# Patient Record
Sex: Male | Born: 1965
Health system: Southern US, Community
[De-identification: ages and names within clinical notes are randomized; demographics above are authoritative.]

## PROBLEM LIST (undated history)

## (undated) DIAGNOSIS — E785 Hyperlipidemia, unspecified: Secondary | ICD-10-CM

## (undated) DIAGNOSIS — E669 Obesity, unspecified: Secondary | ICD-10-CM

## (undated) DIAGNOSIS — E119 Type 2 diabetes mellitus without complications: Secondary | ICD-10-CM

## (undated) DIAGNOSIS — G4730 Sleep apnea, unspecified: Secondary | ICD-10-CM

## (undated) DIAGNOSIS — H409 Unspecified glaucoma: Secondary | ICD-10-CM

## (undated) DIAGNOSIS — I219 Acute myocardial infarction, unspecified: Secondary | ICD-10-CM

## (undated) DIAGNOSIS — I2510 Atherosclerotic heart disease of native coronary artery without angina pectoris: Secondary | ICD-10-CM

## (undated) DIAGNOSIS — S83249A Other tear of medial meniscus, current injury, unspecified knee, initial encounter: Secondary | ICD-10-CM

## (undated) DIAGNOSIS — I10 Essential (primary) hypertension: Secondary | ICD-10-CM

## (undated) DIAGNOSIS — F419 Anxiety disorder, unspecified: Secondary | ICD-10-CM

## (undated) HISTORY — DX: Essential (primary) hypertension: I10

## (undated) HISTORY — DX: Atherosclerotic heart disease of native coronary artery without angina pectoris: I25.10

## (undated) HISTORY — DX: Sleep apnea, unspecified: G47.30

## (undated) HISTORY — DX: Other tear of medial meniscus, current injury, unspecified knee, initial encounter: S83.249A

## (undated) HISTORY — DX: Type 2 diabetes mellitus without complications: E11.9

## (undated) HISTORY — PX: CORONARY ANGIOPLASTY: SHX604

## (undated) HISTORY — DX: Obesity, unspecified: E66.9

## (undated) HISTORY — PX: CARDIAC CATHETERIZATION: SHX172

## (undated) HISTORY — DX: Hyperlipidemia, unspecified: E78.5

## (undated) HISTORY — DX: Anxiety disorder, unspecified: F41.9

## (undated) HISTORY — DX: Unspecified glaucoma: H40.9

---

## 2003-06-26 ENCOUNTER — Ambulatory Visit (HOSPITAL_COMMUNITY): Admission: RE | Admit: 2003-06-26 | Discharge: 2003-06-26 | Payer: Self-pay | Admitting: *Deleted

## 2006-08-01 ENCOUNTER — Ambulatory Visit: Payer: Self-pay | Admitting: Family Medicine

## 2006-08-30 ENCOUNTER — Ambulatory Visit: Payer: Self-pay | Admitting: Family Medicine

## 2006-09-30 ENCOUNTER — Ambulatory Visit: Payer: Self-pay | Admitting: Family Medicine

## 2006-11-02 ENCOUNTER — Ambulatory Visit: Payer: Self-pay | Admitting: Family Medicine

## 2006-11-16 DIAGNOSIS — G4730 Sleep apnea, unspecified: Secondary | ICD-10-CM | POA: Insufficient documentation

## 2006-12-02 ENCOUNTER — Ambulatory Visit: Payer: Self-pay | Admitting: Family Medicine

## 2007-01-03 ENCOUNTER — Ambulatory Visit: Payer: Self-pay | Admitting: Family Medicine

## 2007-04-06 ENCOUNTER — Ambulatory Visit: Payer: Self-pay | Admitting: Family Medicine

## 2007-04-22 ENCOUNTER — Encounter (INDEPENDENT_AMBULATORY_CARE_PROVIDER_SITE_OTHER): Payer: Self-pay | Admitting: Family Medicine

## 2007-06-09 ENCOUNTER — Ambulatory Visit: Payer: Self-pay | Admitting: Family Medicine

## 2007-06-09 DIAGNOSIS — F17200 Nicotine dependence, unspecified, uncomplicated: Secondary | ICD-10-CM | POA: Insufficient documentation

## 2007-06-19 ENCOUNTER — Telehealth (INDEPENDENT_AMBULATORY_CARE_PROVIDER_SITE_OTHER): Payer: Self-pay | Admitting: *Deleted

## 2007-07-07 ENCOUNTER — Ambulatory Visit: Payer: Self-pay | Admitting: Family Medicine

## 2007-07-07 DIAGNOSIS — M25562 Pain in left knee: Secondary | ICD-10-CM | POA: Insufficient documentation

## 2007-07-10 ENCOUNTER — Telehealth (INDEPENDENT_AMBULATORY_CARE_PROVIDER_SITE_OTHER): Payer: Self-pay | Admitting: *Deleted

## 2007-07-12 ENCOUNTER — Encounter (INDEPENDENT_AMBULATORY_CARE_PROVIDER_SITE_OTHER): Payer: Self-pay | Admitting: Family Medicine

## 2007-07-17 ENCOUNTER — Telehealth (INDEPENDENT_AMBULATORY_CARE_PROVIDER_SITE_OTHER): Payer: Self-pay | Admitting: Family Medicine

## 2007-08-04 ENCOUNTER — Telehealth (INDEPENDENT_AMBULATORY_CARE_PROVIDER_SITE_OTHER): Payer: Self-pay | Admitting: *Deleted

## 2007-09-09 ENCOUNTER — Ambulatory Visit: Payer: Self-pay | Admitting: Family Medicine

## 2007-09-10 ENCOUNTER — Emergency Department (HOSPITAL_COMMUNITY): Admission: EM | Admit: 2007-09-10 | Discharge: 2007-09-10 | Payer: Self-pay | Admitting: Emergency Medicine

## 2007-10-16 ENCOUNTER — Ambulatory Visit: Payer: Self-pay | Admitting: Family Medicine

## 2007-11-20 ENCOUNTER — Ambulatory Visit: Payer: Self-pay | Admitting: Family Medicine

## 2007-11-21 ENCOUNTER — Ambulatory Visit: Payer: Self-pay | Admitting: Family Medicine

## 2007-11-23 LAB — CONVERTED CEMR LAB
Cholesterol: 236 mg/dL (ref 0–200)
Glucose, Bld: 100 mg/dL — ABNORMAL HIGH (ref 70–99)
HDL: 31.5 mg/dL — ABNORMAL LOW (ref >39.0)
Total CHOL/HDL Ratio: 7.5
Triglycerides: 185 mg/dL — ABNORMAL HIGH (ref 0–149)
VLDL: 37 mg/dL (ref 0–40)

## 2007-11-24 ENCOUNTER — Encounter (INDEPENDENT_AMBULATORY_CARE_PROVIDER_SITE_OTHER): Payer: Self-pay | Admitting: *Deleted

## 2008-04-03 ENCOUNTER — Ambulatory Visit: Payer: Self-pay | Admitting: Internal Medicine

## 2009-02-25 ENCOUNTER — Ambulatory Visit: Payer: Self-pay | Admitting: Cardiology

## 2009-02-25 ENCOUNTER — Telehealth: Payer: Self-pay | Admitting: Internal Medicine

## 2009-02-25 ENCOUNTER — Encounter: Payer: Self-pay | Admitting: Cardiology

## 2009-02-25 ENCOUNTER — Observation Stay (HOSPITAL_COMMUNITY): Admission: EM | Admit: 2009-02-25 | Discharge: 2009-02-27 | Payer: Self-pay | Admitting: Emergency Medicine

## 2009-03-03 ENCOUNTER — Telehealth: Payer: Self-pay | Admitting: Cardiology

## 2009-03-06 ENCOUNTER — Encounter: Payer: Self-pay | Admitting: Cardiology

## 2009-03-06 ENCOUNTER — Telehealth: Payer: Self-pay | Admitting: Cardiology

## 2009-03-11 ENCOUNTER — Telehealth (INDEPENDENT_AMBULATORY_CARE_PROVIDER_SITE_OTHER): Payer: Self-pay | Admitting: *Deleted

## 2009-03-14 ENCOUNTER — Telehealth: Payer: Self-pay | Admitting: Cardiology

## 2009-03-18 ENCOUNTER — Encounter: Payer: Self-pay | Admitting: Cardiology

## 2009-03-18 DIAGNOSIS — E785 Hyperlipidemia, unspecified: Secondary | ICD-10-CM | POA: Insufficient documentation

## 2009-03-18 DIAGNOSIS — Z9861 Coronary angioplasty status: Secondary | ICD-10-CM

## 2009-03-18 DIAGNOSIS — I2510 Atherosclerotic heart disease of native coronary artery without angina pectoris: Secondary | ICD-10-CM | POA: Insufficient documentation

## 2009-03-19 ENCOUNTER — Ambulatory Visit: Payer: Self-pay | Admitting: Cardiology

## 2009-03-20 ENCOUNTER — Encounter (HOSPITAL_COMMUNITY): Admission: RE | Admit: 2009-03-20 | Discharge: 2009-06-13 | Payer: Self-pay | Admitting: Cardiology

## 2009-04-01 ENCOUNTER — Encounter: Payer: Self-pay | Admitting: Cardiology

## 2009-05-30 ENCOUNTER — Ambulatory Visit: Payer: Self-pay | Admitting: Cardiology

## 2009-06-17 ENCOUNTER — Ambulatory Visit: Payer: Self-pay | Admitting: Cardiology

## 2009-06-23 LAB — CONVERTED CEMR LAB
AST: 22 units/L (ref 0–37)
Albumin: 4.2 g/dL (ref 3.5–5.2)
HDL: 40.2 mg/dL (ref >39.00)
Total Bilirubin: 0.8 mg/dL (ref 0.3–1.2)
Total CHOL/HDL Ratio: 3
Triglycerides: 80 mg/dL (ref 0.0–149.0)

## 2009-08-28 ENCOUNTER — Ambulatory Visit: Payer: Self-pay | Admitting: Cardiology

## 2009-10-22 ENCOUNTER — Ambulatory Visit: Payer: Self-pay

## 2009-10-22 ENCOUNTER — Ambulatory Visit: Payer: Self-pay | Admitting: Cardiology

## 2010-04-06 ENCOUNTER — Ambulatory Visit: Payer: Self-pay | Admitting: Cardiology

## 2010-04-20 ENCOUNTER — Ambulatory Visit: Payer: Self-pay | Admitting: Cardiology

## 2010-04-23 LAB — CONVERTED CEMR LAB
ALT: 27 units/L (ref 0–53)
Albumin: 4.3 g/dL (ref 3.5–5.2)
BUN: 16 mg/dL (ref 6–23)
Basophils Relative: 0.4 % (ref 0.0–3.0)
Bilirubin, Direct: 0.1 mg/dL (ref 0.0–0.3)
Chloride: 104 meq/L (ref 96–112)
Cholesterol: 118 mg/dL (ref 0–200)
Eosinophils Relative: 1.6 % (ref 0.0–5.0)
GFR calc non Af Amer: 71.91 mL/min (ref >60)
HCT: 42.8 % (ref 39.0–52.0)
HDL: 39.2 mg/dL (ref >39.00)
Hemoglobin: 14.8 g/dL (ref 13.0–17.0)
LDL Cholesterol: 63 mg/dL (ref 0–99)
Lymphs Abs: 2.7 10*3/uL (ref 0.7–4.0)
MCV: 93.5 fL (ref 78.0–100.0)
Monocytes Absolute: 0.6 10*3/uL (ref 0.1–1.0)
Monocytes Relative: 8.2 % (ref 3.0–12.0)
Neutro Abs: 3.6 10*3/uL (ref 1.4–7.7)
Platelets: 224 10*3/uL (ref 150.0–400.0)
Potassium: 4.9 meq/L (ref 3.5–5.1)
RBC: 4.57 M/uL (ref 4.22–5.81)
Sodium: 137 meq/L (ref 135–145)
Total Protein: 6.6 g/dL (ref 6.0–8.3)
VLDL: 16.2 mg/dL (ref 0.0–40.0)
WBC: 7 10*3/uL (ref 4.5–10.5)

## 2010-05-29 ENCOUNTER — Telehealth: Payer: Self-pay | Admitting: Cardiology

## 2010-06-03 ENCOUNTER — Telehealth: Payer: Self-pay | Admitting: Cardiology

## 2010-06-04 ENCOUNTER — Telehealth: Payer: Self-pay | Admitting: Cardiology

## 2010-06-24 ENCOUNTER — Encounter: Payer: Self-pay | Admitting: Cardiology

## 2010-07-02 ENCOUNTER — Telehealth: Payer: Self-pay | Admitting: Cardiology

## 2010-07-06 ENCOUNTER — Ambulatory Visit: Payer: Self-pay | Admitting: Family Medicine

## 2010-07-14 ENCOUNTER — Encounter: Payer: Self-pay | Admitting: Family Medicine

## 2010-09-18 ENCOUNTER — Ambulatory Visit: Payer: Self-pay | Admitting: Internal Medicine

## 2010-09-29 ENCOUNTER — Ambulatory Visit
Admission: RE | Admit: 2010-09-29 | Discharge: 2010-09-29 | Payer: Self-pay | Source: Home / Self Care | Attending: Internal Medicine | Admitting: Internal Medicine

## 2010-10-27 NOTE — Consult Note (Signed)
Summary: [facility]   Imported By: Lanelle Bal 07/22/2010 10:04:03  _____________________________________________________________________  External Attachment:    Type:   Image     Comment:   External Document

## 2010-10-27 NOTE — Progress Notes (Signed)
Summary: refill request [facility] and local pharmacy  Phone Note Refill Request Message from:  Patient on May 29, 2010 10:28 AM  Refills Requested: Medication #1:  METOPROLOL SUCCINATE 25 MG XR24H-TAB Take one tablet by mouth daily  Medication #2:  CRESTOR 20 MG TABS Take one tablet by mouth daily.  Medication #3:  WELLBUTRIN SR 150 MG XR12H-TAB 1 tab two times a day. [facility] refill- pt out however and would like a 3 week supply of the crestor and metoprolol called tot [facility] [address]   Method Requested: Fax to Local Pharmacy Initial call taken by: Glynda Jaeger,  May 29, 2010 10:28 AM    Prescriptions: WELLBUTRIN SR 150 MG XR12H-TAB (BUPROPION HCL) 1 tab two times a day  #60 x 0   Entered by:   Burnett Kanaris, CNA   Authorized by:   Lenoria Farrier, MD, [facility]   Signed by:   Burnett Kanaris, CNA on 06/02/2010   Method used:   Electronically to        [facility] [address] [id number]* (retail)       [address]       [place], [place]  [address]       Ph: [phone]       Fax: [phone]   RxID:   [id number] CRESTOR 20 MG TABS (ROSUVASTATIN CALCIUM) Take one tablet by mouth daily.  #30 x 0   Entered by:   Burnett Kanaris, CNA   Authorized by:   Lenoria Farrier, MD, [facility]   Signed by:   Burnett Kanaris, CNA on 06/02/2010   Method used:   Electronically to        [id number]* (retail)       [address]       [place], [place]  [address]       Ph: [phone]       Fax: [phone]   RxID:   [id number] METOPROLOL SUCCINATE 25 MG XR24H-TAB (METOPROLOL SUCCINATE) Take one tablet by mouth daily  #30 x 0   Entered by:   Burnett Kanaris, CNA   Authorized by:   Lenoria Farrier, MD, [facility]   Signed by:   Burnett Kanaris, CNA on 06/02/2010   Method used:   Electronically to        [facility] [id number]* (retail)       [address]       [place], [place]  [address]       Ph: [phone]       Fax: [phone]   RxID:   [id number] [facility] SR 150 MG XR12H-TAB (BUPROPION HCL) 1 tab two times a day  #180 x 3   Entered by:   Burnett Kanaris, CNA   Authorized by:   Lenoria Farrier, MD, [facility]   Signed by:   Burnett Kanaris, CNA on 06/02/2010   Method used:   Faxed to ...       [facility] (mail-order)             , [place]         Ph: [phone]       Fax: [phone]   RxID:   [id number] CRESTOR 20 MG TABS (ROSUVASTATIN CALCIUM) Take one tablet by mouth daily.  #90 x 3   Entered by:  Burnett Kanaris, CNA   Authorized by:   Lenoria Farrier, MD, [facility]   Signed by:   Burnett Kanaris, CNA on 06/02/2010   Method used:   Faxed to ...       [facility] (mail-order)             , [place]         Ph: [phone]       Fax: [phone]   RxID:   [id number] METOPROLOL SUCCINATE 25 MG XR24H-TAB (METOPROLOL SUCCINATE) Take one tablet by mouth daily  #90 x 3   Entered by:   Burnett Kanaris, CNA   Authorized by:   Lenoria Farrier, MD, [facility]   Signed by:   Burnett Kanaris, CNA on 06/02/2010   Method used:   Faxed to ...       [facility] (mail-order)             , [place]         Ph: [phone]       Fax: [phone]   RxID:   [id number]

## 2010-10-27 NOTE — Progress Notes (Signed)
Summary: Wellbutrin- DO NOT REFILL  Phone Note Outgoing Call   Summary of Call: Fax received from [facility] questioning duration the pt has been on bupropion. In reviewing the pt's chart, he was started on this in 6/10 at hospital d/c for smokeless tobacco usage. I have reviewed with Dr. [name], since I see no other indication for this. He states we are not to refill this anymore, unless the pt is taking it for something other than smoking cessation. If he is, he needs to have this refilled through his PCP. I will send a flag to Burnett Kanaris- CMA for next time the pt's refill is due. Initial call taken by: Sherri Rad, RN, BSN,  July 02, 2010 2:11 PM

## 2010-10-27 NOTE — Assessment & Plan Note (Signed)
Summary: CUT ON LEFT BIG TOE//PH   Vital Signs:  Patient profile:   45 year old male Height:      72 inches Weight:      252 pounds BMI:     34.30 Temp:     98.4 degrees F oral BP sitting:   124 / 80  (left arm)  Vitals Entered By: Doristine Devoid CMA (July 06, 2010 9:16 AM) CC: cut on L toe xtues. painful    History of Present Illness: 45 yo man here today for a wound on his L toe.  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  area is now painful and swollen.  pain to the point of nausea.  no pus.  reports boards were plastic and not wooden so no possibility of foreign body.  hx of ingrown nail.  Current Medications (verified): 1)  Aspirin Ec 325 Mg Tbec (Aspirin) .... Take One Tablet By Mouth Daily 2)  Metoprolol Succinate 25 Mg Xr24h-Tab (Metoprolol Succinate) .... Take One Tablet By Mouth Daily 3)  Crestor 20 Mg Tabs (Rosuvastatin Calcium) .... Take One Tablet By Mouth Daily. 4)  Nitrostat 0.4 Mg Subl (Nitroglycerin) .Marland Kitchen.. 1 By Mouth As Needed For Chest Pain As Directed 5)  Wellbutrin Sr 150 Mg Xr12h-Tab (Bupropion Hcl) .Marland Kitchen.. 1 Tab Two Times A Day  Allergies (verified): No Known Drug Allergies  Review of Systems      See HPI  Physical Exam  General:  alert and well-developed.   Neurologic:  left great toe w/ small laceration at lateral nail edge.  nail is short and appears to be ingrown.  due to pt's level of pain will refer to podiatry for full evaluation and tx.  no obvious infxn- no pus or drainage from the area.   Impression & Recommendations:  Problem # 1:  INGROWN NAIL (ICD-703.0) Assessment Unchanged given laceration superimposed on ingrown nail will refer to podiatry for evaluation and tx.  pt unable to tolerate exam of toe and nail in office due to pain.  no obvious infxn- will hold on abx. Orders: Podiatry Referral (Podiatry)  Complete Medication List: 1)  Aspirin Ec 325 Mg Tbec (Aspirin) .... Take one tablet by mouth daily 2)   Metoprolol Succinate 25 Mg Xr24h-tab (Metoprolol succinate) .... Take one tablet by mouth daily 3)  Crestor 20 Mg Tabs (Rosuvastatin calcium) .... Take one tablet by mouth daily. 4)  Nitrostat 0.4 Mg Subl (Nitroglycerin) .Marland Kitchen.. 1 by mouth as needed for chest pain as directed 5)  Wellbutrin Sr 150 Mg Xr12h-tab (Bupropion hcl) .Marland Kitchen.. 1 tab two times a day  Patient Instructions: 1)  Our referral coordinator will get you set up w/ your podiatry appt 2)  At this point the cut itself doesn't appear to be infected but once podiatry evaluates you they will better be able to determine if you need antibiotics 3)  No more breaking boards for awhile!

## 2010-10-27 NOTE — Progress Notes (Signed)
Summary: rtn call from debra jones  Phone Note Refill Request Call back at [phone] Message from:  Pharmacy on [place] from [facility]  rtn your call Debra  Initial call taken by: Omer Jack,  June 04, 2010 10:24 AM  Follow-up for Phone Call        pt  plavix was d/c in July . I explained to pt that plavix  was discontiued  No refills Follow-up by: Burnett Kanaris, CNA,  June 04, 2010 2:55 PM

## 2010-10-27 NOTE — Miscellaneous (Signed)
Summary: Pt Instructions- Post GXT  Clinical Lists Changes  Observations: Added new observation of PI CARDIO: Your physician wants you to follow-up in: 6 months.  You will receive a reminder letter in the mail two months in advance. If you don't receive a letter, please call our office to schedule the follow-up appointment. (10/22/2009 17:34)      Patient Instructions: 1)  Your physician wants you to follow-up in: 6 months.  You will receive a reminder letter in the mail two months in advance. If you don't receive a letter, please call our office to schedule the follow-up appointment.

## 2010-10-27 NOTE — Assessment & Plan Note (Signed)
Summary: f5m   Primary Provider:  Nolon Rod. Paz MD   History of Present Illness: Patient is 45 years old and return for followup management of CAD. In June of 2010 he had an inferior MI treated with a drug-eluting stent to the posterior descending branch of the right coronary. His ejection fraction is 55%. Following his MI he lost 40 pounds. There he has done quite well since that time has had no chest pain shortness of breath or palpitations. He was using smokeless tobacco but he stopped this. His other problem includes hyperlipidemia.  He works with [employer]. He also does a form of judo.  Current Medications (verified): 1)  Aspirin Ec 325 Mg Tbec (Aspirin) .... Take One Tablet By Mouth Daily 2)  Plavix 75 Mg Tabs (Clopidogrel Bisulfate) .... Take One Tablet By Mouth Daily 3)  Metoprolol Succinate 25 Mg Xr24h-Tab (Metoprolol Succinate) .... Take One Tablet By Mouth Daily 4)  Crestor 20 Mg Tabs (Rosuvastatin Calcium) .... Take One Tablet By Mouth Daily. 5)  Nitrostat 0.4 Mg Subl (Nitroglycerin) .Marland Kitchen.. 1 By Mouth As Needed For Chest Pain As Directed 6)  Wellbutrin Sr 150 Mg Xr12h-Tab (Bupropion Hcl) .Marland Kitchen.. 1 Tab Two Times A Day  Allergies (verified): No Known Drug Allergies  Past History:  Past Medical History: Reviewed history from 03/18/2009 and no changes required.    SECONDARY DIAGNOSES: 1. Acute DMI Rx  successful percutaneous     coronary intervention and stenting of the posterior descending     artery with 2.5 x 15 mm XIENCE V drug-eluting stent this admission. 2. Hyperlipidemia. 3. Smokeless tobacco usage. 4. History of "white coat" hypertension. 5. Anxiety.  Review of Systems       ROS is negative except as outlined in HPI.   Vital Signs:  Patient profile:   45 year old male Height:      72 inches Weight:      243 pounds BMI:     33.08 Pulse rate:   64 / minute Resp:     16 per minute BP sitting:   126 / 86  (left arm)  Vitals Entered By: Marrion Coy, CNA (April 06, 2010 3:41 PM)  Physical Exam  Additional Exam:  Gen. Well-nourished, in no distress   Neck: No JVD, thyroid not enlarged, no carotid bruits Lungs: No tachypnea, clear without rales, rhonchi or wheezes Cardiovascular: Rhythm regular, PMI not displaced,  heart sounds  normal, no murmurs or gallops, no peripheral edema, pulses normal in all 4 extremities. Abdomen: BS normal, abdomen soft and non-tender without masses or organomegaly, no hepatosplenomegaly. MS: No deformities, no cyanosis or clubbing   Neuro:  No focal sns   Skin:  no lesions    Impression & Recommendations:  Problem # 1:  CAD, NATIVE VESSEL (ICD-414.01) Hhad an inferior MI treated in June of 2010 with a drug-eluting stent to the posterior descending branch the right coronary. Had no chest pain his palm is stable. Since this is a small branch vessel I think we can stop the Plavix and continue the aspirin.  I'll arrange for him to see Dr. Excell Seltzer back in followup in one year.   The following medications were removed from the medication list:    Plavix 75 Mg Tabs (Clopidogrel bisulfate) .Marland Kitchen... Take one tablet by mouth daily His updated medication list for this problem includes:    Aspirin Ec 325 Mg Tbec (Aspirin) .Marland Kitchen... Take one tablet by mouth daily    Metoprolol Succinate 25 Mg Xr24h-tab (  Metoprolol succinate) .Marland Kitchen... Take one tablet by mouth daily    Nitrostat 0.4 Mg Subl (Nitroglycerin) .Marland Kitchen... 1 by mouth as needed for chest pain as directed  Problem # 2:  HYPERLIPIDEMIA-MIXED (ICD-272.4) Tis managed with Crestor. His last lipid profile is good. We will plan to get a repeat fasting lipid profile. His updated medication list for this problem includes:    Crestor 20 Mg Tabs (Rosuvastatin calcium) .Marland Kitchen... Take one tablet by mouth daily.  His updated medication list for this problem includes:    Crestor 20 Mg Tabs (Rosuvastatin calcium) .Marland Kitchen... Take one tablet by mouth daily.  Patient Instructions: 1)  Your physician recommends  that you schedule a follow-up appointment in: 12 months with Dr. Excell Seltzer 2)  Your physician has recommended you make the following change in your medication:  STOP PLAVIX 3)  Your physician recommends that you return for a FASTING lipid profile, liver, cbc, and bmp Prescriptions: NITROSTAT 0.4 MG SUBL (NITROGLYCERIN) 1 by mouth as needed for chest pain as directed  #25 x 6   Entered by:   Marrion Coy, CNA   Authorized by:   Lenoria Farrier, MD, [facility]   Signed by:   Marrion Coy, CNA on 04/06/2010   Method used:   Electronically to        [facility] [address] [id number]* (retail)       [address]       [place], [place]  [address]       Ph: [phone]       Fax: [phone]   RxID:   [id number]

## 2010-10-27 NOTE — Progress Notes (Signed)
Summary: REFILL  Phone Note Refill Request Message from:  Patient on June 03, 2010 1:59 PM  Refills Requested: Medication #1:  METOPROLOL SUCCINATE 25 MG XR24H-TAB Take one tablet by mouth daily  Medication #2:  CRESTOR 20 MG TABS Take one tablet by mouth daily.  Medication #3:  WELLBUTRIN SR 150 MG XR12H-TAB 1 tab two times a day. SEND TO [facility] [address] PT IS OUT OF MEDICATION   Initial call taken by: Judie Grieve,  June 03, 2010 2:00 PM  Follow-up for Phone Call        I sebnt script in and faxed it too Follow-up by: Burnett Kanaris, CNA,  June 03, 2010 4:27 PM

## 2010-10-27 NOTE — Letter (Signed)
Summary: [facility]  [facility]   Imported By: Roderic Ovens 08/04/2010 16:19:23  _____________________________________________________________________  External Attachment:    Type:   Image     Comment:   External Document

## 2010-10-29 NOTE — Assessment & Plan Note (Signed)
Summary: mole removal//lch   Vital Signs:  Patient profile:   45 year old male Weight:      254.25 pounds Pulse rate:   71 / minute Pulse rhythm:   regular BP sitting:   136 / 80  (left arm) Cuff size:   large  Vitals Entered By: Army Fossa CMA (September 22, 2010 10:44 AM) CC: mole removal- upper thigh    History of Present Illness: last year he become aware of a mole at the right groin. Since then , it  has not change in size but cause some discomfort d/t location  Current Medications (verified): 1)  Aspirin Ec 325 Mg Tbec (Aspirin) .... Take One Tablet By Mouth Daily 2)  Crestor 20 Mg Tabs (Rosuvastatin Calcium) .... Take One Tablet By Mouth Daily. 3)  Nitrostat 0.4 Mg Subl (Nitroglycerin) .Marland Kitchen.. 1 By Mouth As Needed For Chest Pain As Directed 4)  Wellbutrin Sr 150 Mg Xr12h-Tab (Bupropion Hcl) .Marland Kitchen.. 1 Tab Two Times A Day  Allergies (verified): No Known Drug Allergies  Past History:  Past Medical History: 1. Acute DMI Rx  successful percutaneous     coronary intervention and stenting of the posterior descending     artery with 2.5 x 15 mm XIENCE V drug-eluting stent this admission. 2. Hyperlipidemia. 3. Smokeless tobacco usage. 4. History of "white coat" hypertension. 5. Anxiety.  Past Surgical History: Reviewed history from 11/21/2007 and no changes required. 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   Skin:  has a 3  mm , round, hyperchromic lesion at the right groin   Impression & Recommendations:  Problem # 1:  procedure note indication for the procedure:----right groin lesion causing discomfort in a sterile fashion and under local anesthesia with lidocaine 2% without, with did a superficial incision and removed  the lesion. He bled more than what I was expecting (on aspirin) I put 2 stitches He is to apply pressure if  bleeding Physical if signs or symptoms of infection Pathology sent------------------------------------------------------SEBORRHEIC KERATOSIS Come back in one week for stitches removal  Complete Medication List: 1)  Aspirin Ec 325 Mg Tbec (Aspirin) .... Take one tablet by mouth daily 2)  Crestor 20 Mg Tabs (Rosuvastatin calcium) .... Take one tablet by mouth daily. 3)  Nitrostat 0.4 Mg Subl (Nitroglycerin) .Marland Kitchen.. 1 by mouth as needed for chest pain as directed 4)  Wellbutrin Sr 150 Mg Xr12h-tab (Bupropion hcl) .Marland Kitchen.. 1 tab two times a day  Other Orders: Excise lesion (TAL) 0 - 0.5 cm (11400)   Orders Added: 1)  Excise lesion (TAL) 0 - 0.5 cm [11400]

## 2010-10-29 NOTE — Assessment & Plan Note (Signed)
Summary: remove stitches///sph   Vital Signs:  Patient profile:   45 year old male Weight:      257.25 pounds Pulse rate:   78 / minute Pulse rhythm:   regular BP sitting:   126 / 84  (left arm) Cuff size:   large  Vitals Entered By: Army Fossa CMA (September 29, 2010 9:47 AM) CC: Remove stitches    Current Medications (verified): 1)  Aspirin Ec 325 Mg Tbec (Aspirin) .... Take One Tablet By Mouth Daily 2)  Crestor 20 Mg Tabs (Rosuvastatin Calcium) .... Take One Tablet By Mouth Daily. 3)  Nitrostat 0.4 Mg Subl (Nitroglycerin) .Marland Kitchen.. 1 By Mouth As Needed For Chest Pain As Directed 4)  Wellbutrin Sr 150 Mg Xr12h-Tab (Bupropion Hcl) .Marland Kitchen.. 1 Tab Two Times A Day  Allergies (verified): No Known Drug Allergies   Impression & Recommendations:  Problem # 1:  SEBORRHEIC KERATOSIS (ICD-702.19)  sutures removed wound healing well no evidence of infex path discussed   Orders: No Charge Patient Arrived (NCPA0) (NCPA0)  Complete Medication List: 1)  Aspirin Ec 325 Mg Tbec (Aspirin) .... Take one tablet by mouth daily 2)  Crestor 20 Mg Tabs (Rosuvastatin calcium) .... Take one tablet by mouth daily. 3)  Nitrostat 0.4 Mg Subl (Nitroglycerin) .Marland Kitchen.. 1 by mouth as needed for chest pain as directed 4)  Wellbutrin Sr 150 Mg Xr12h-tab (Bupropion hcl) .Marland Kitchen.. 1 tab two times a day   Orders Added: 1)  No Charge Patient Arrived (NCPA0) [NCPA0]

## 2010-12-07 ENCOUNTER — Ambulatory Visit (INDEPENDENT_AMBULATORY_CARE_PROVIDER_SITE_OTHER): Payer: 59 | Admitting: Internal Medicine

## 2010-12-07 ENCOUNTER — Encounter: Payer: Self-pay | Admitting: Internal Medicine

## 2010-12-07 DIAGNOSIS — M25569 Pain in unspecified knee: Secondary | ICD-10-CM

## 2010-12-15 NOTE — Assessment & Plan Note (Signed)
Summary: LEFT KNEE PAIN/RH   Vital Signs:  Patient profile:   45 year old male Weight:      252.13 pounds Pulse rate:   76 / minute Pulse rhythm:   regular BP sitting:   142 / 82  (left arm) Cuff size:   large  Vitals Entered By: Army Fossa CMA (December 07, 2010 10:45 AM) CC: (L) knee pain  Comments x 4 weeks feels like he over extended it [employer] [address]  states he is on on another med- unsure of name, will call    History of Present Illness:  left knee pain for 3   weeks,  symptoms started after he hyperextended the left knee when he was practicing martial arts.  the knee was swollen for 3 days , that resolved  the  pain is mostly on the medial aspect , worse when he uses the knee.  Review of systems  denies any locking of the knee, the knee sometimes gives up.   Allergies (verified): No Known Drug Allergies  Past History:  Past Medical History: Reviewed history from 09/22/2010 and no changes required. 1. Acute DMI Rx  successful percutaneous     coronary intervention and stenting of the posterior descending     artery with 2.5 x 15 mm XIENCE V drug-eluting stent this admission. 2. Hyperlipidemia. 3. Smokeless tobacco usage. 4. History of "white coat" hypertension. 5. Anxiety.  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   Extremities:   right lower extremity normal Left lower extremity: it is a slightly puffy without obvious effusion. Range of motion is normal  the joint is stable to manipulation  although pain is elicited with lateral pressure Slightly tender on the medial aspect .   Impression & Recommendations:  Problem # 1:  KNEE PAIN, LEFT, ACUTE (ICD-719.46)   suspect ligament  versus meniscal issue. refer to ortho  to use Tylenol or Advil for pain during the daytime, hydrocodone at bedtime. Knows about drowsiness.  His  updated medication list for this problem includes:    Aspirin Ec 325 Mg Tbec (Aspirin) .Marland Kitchen... Take one tablet by mouth daily    Hydrocodone-acetaminophen 5-500 Mg Tabs (Hydrocodone-acetaminophen) .Marland Kitchen... 1 or 2 by mouth at bedtime as needed pain  Orders: Orthopedic Referral (Ortho)  Complete Medication List: 1)  Aspirin Ec 325 Mg Tbec (Aspirin) .... Take one tablet by mouth daily 2)  Crestor 20 Mg Tabs (Rosuvastatin calcium) .... Take one tablet by mouth daily. 3)  Nitrostat 0.4 Mg Subl (Nitroglycerin) .Marland Kitchen.. 1 by mouth as needed for chest pain as directed 4)  Wellbutrin Sr 150 Mg Xr12h-tab (Bupropion hcl) .Marland Kitchen.. 1 tab two times a day 5)  Hydrocodone-acetaminophen 5-500 Mg Tabs (Hydrocodone-acetaminophen) .Marland Kitchen.. 1 or 2 by mouth at bedtime as needed pain  Patient Instructions: 1)  we are refering you to orthopedic surgery Prescriptions: HYDROCODONE-ACETAMINOPHEN 5-500 MG TABS (HYDROCODONE-ACETAMINOPHEN) 1 or 2 by mouth at bedtime as needed pain  #30 x 0   Entered and Authorized by:   Nolon Rod. Terell Kincy MD   Signed by:   Nolon Rod. Trevaughn Schear MD on 12/07/2010   Method used:   Print then Give to Patient   RxID:   [id number]    Orders Added: 1)  Est. Patient Level III [14782] 2)  Orthopedic Referral [Ortho]

## 2010-12-17 ENCOUNTER — Encounter (HOSPITAL_BASED_OUTPATIENT_CLINIC_OR_DEPARTMENT_OTHER)
Admission: RE | Admit: 2010-12-17 | Discharge: 2010-12-17 | Disposition: A | Discharge status: Home / Self Care | Payer: 59 | Source: Ambulatory Visit | Attending: Orthopedic Surgery | Admitting: Orthopedic Surgery

## 2010-12-17 ENCOUNTER — Telehealth: Payer: Self-pay | Admitting: Cardiology

## 2010-12-17 LAB — POCT I-STAT, CHEM 8
BUN: 14 mg/dL (ref 6–23)
Calcium, Ion: 1.2 mmol/L (ref 1.12–1.32)
HCT: 43 % (ref 39.0–52.0)
Hemoglobin: 14.6 g/dL (ref 13.0–17.0)
Sodium: 143 mEq/L (ref 135–145)
TCO2: 26 mmol/L (ref 0–100)

## 2010-12-17 NOTE — Telephone Encounter (Signed)
12,LOV,Any Cardiac Test faxed to [facility] @ [phone]/KM

## 2010-12-17 NOTE — Telephone Encounter (Signed)
Records faxed.

## 2010-12-17 NOTE — Telephone Encounter (Signed)
[facility] @ [phone]/Pre- Opp Nurse/KM

## 2010-12-18 ENCOUNTER — Ambulatory Visit (HOSPITAL_BASED_OUTPATIENT_CLINIC_OR_DEPARTMENT_OTHER)
Admission: RE | Admit: 2010-12-18 | Discharge: 2010-12-18 | Disposition: A | Discharge status: Home / Self Care | Payer: 59 | Source: Ambulatory Visit | Attending: Orthopedic Surgery | Admitting: Orthopedic Surgery

## 2010-12-18 DIAGNOSIS — I2510 Atherosclerotic heart disease of native coronary artery without angina pectoris: Secondary | ICD-10-CM | POA: Insufficient documentation

## 2010-12-18 DIAGNOSIS — Z01812 Encounter for preprocedural laboratory examination: Secondary | ICD-10-CM | POA: Insufficient documentation

## 2010-12-18 DIAGNOSIS — I10 Essential (primary) hypertension: Secondary | ICD-10-CM | POA: Insufficient documentation

## 2010-12-18 DIAGNOSIS — M2240 Chondromalacia patellae, unspecified knee: Secondary | ICD-10-CM | POA: Insufficient documentation

## 2010-12-18 DIAGNOSIS — M6750 Plica syndrome, unspecified knee: Secondary | ICD-10-CM | POA: Insufficient documentation

## 2010-12-18 DIAGNOSIS — M23329 Other meniscus derangements, posterior horn of medial meniscus, unspecified knee: Secondary | ICD-10-CM | POA: Insufficient documentation

## 2010-12-21 LAB — POCT HEMOGLOBIN-HEMACUE: Hemoglobin: 14.7 g/dL (ref 13.0–17.0)

## 2010-12-22 NOTE — Op Note (Signed)
  [name], William Mckenzie      ACCOUNT NO.:  [id number]  MEDICAL RECORD NO.:  [id number]           PATIENT TYPE:  LOCATION:                                 FACILITY:  PHYSICIAN:  Harvie Junior, M.D.        DATE OF BIRTH:  DATE OF PROCEDURE:  12/18/2010 DATE OF DISCHARGE:                              OPERATIVE REPORT   PREOPERATIVE DIAGNOSIS:  Medial meniscal tear with suspected chondromalacia.  POSTOPERATIVE DIAGNOSIS: 1. Medial meniscal tear. 2. 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 3. Large medial shelf plica.  PROCEDURES: 1. Partial posterior horn medial meniscectomy with corresponding     debridement of medial compartment. 2. Debridement of medial femoral trochlea down to bleeding bone. 3. Debridement of large symptomatic medial shelf plica.  SURGEON:  Harvie Junior, MD  ASSISTANT:  Marshia Ly, PA  ANESTHESIA:  General.  BRIEF HISTORY:  [name] is a 45 year old male with a long history of significant right knee pain.  He had been treated conservatively for prolonged period of time and had continued pain.  Ultrasound was obtained, which showed the posterior medial meniscal tear and because of continued complaints of pain, he was also taken to the operating room for operative knee arthroscopy.  We had discussion preoperatively about risks and benefits, which he understood and he was taken to the operating room for this procedure.  PROCEDURE:  The patient was taken to operating room.  After adequate anesthesia was done with general anesthetic, the patient was placed supine on the operating table.  The left leg was prepped and draped in usual sterile fashion.  Following this, routine arthroscopic examination of the knee revealed there was an obvious posterior horn medial meniscal tear, it was complex in nature with anterior and posterior flaps, it was debrided back to smooth and stable rim, grade 2 changes on  the medial femoral condyle, grade 2 changes on the medial tibial plateau, and these were debrided.  ACL normal lateral side normal came back around medially and looked medial side.  As we went up on the medial femoral trochlea, there was an area of grade 4 chondromalacia, which was debrided down to a level of bleeding bone.  Attention was turned up to trying to get into the patellofemoral area and there was a large plica, which was debrided. Once this had been done, the knee was copiously and thoroughly lavaged and suctioned dry. Portals were closed with bandage.  Sterile compression was applied.  The patient was taken to the recovery room and was noted to be in satisfactory position.  ESTIMATED BLOOD LOSS:  None.     Harvie Junior, M.D.     Ranae Plumber  D:  12/18/2010  T:  12/19/2010  Job:  [id number]  Electronically Signed by Jodi Geralds M.D. on 12/22/2010 11:15:40 AM

## 2010-12-27 HISTORY — PX: KNEE SURGERY: SHX244

## 2011-01-04 LAB — POCT I-STAT, CHEM 8
BUN: 9 mg/dL (ref 6–23)
Creatinine, Ser: 1 mg/dL (ref 0.4–1.5)
Potassium: 4.1 mEq/L (ref 3.5–5.1)
Sodium: 137 mEq/L (ref 135–145)
TCO2: 24 mmol/L (ref 0–100)

## 2011-01-04 LAB — CBC
HCT: 46.6 % (ref 39.0–52.0)
Platelets: 210 10*3/uL (ref 150–400)
Platelets: 226 10*3/uL (ref 150–400)
RDW: 12.5 % (ref 11.5–15.5)
RDW: 12.7 % (ref 11.5–15.5)
WBC: 11.6 10*3/uL — ABNORMAL HIGH (ref 4.0–10.5)
WBC: 9.7 10*3/uL (ref 4.0–10.5)

## 2011-01-04 LAB — PROTIME-INR: Prothrombin Time: 12.2 seconds (ref 11.6–15.2)

## 2011-01-04 LAB — LIPID PANEL
Cholesterol: 198 mg/dL (ref 0–200)
Cholesterol: 257 mg/dL — ABNORMAL HIGH (ref 0–200)
HDL: 31 mg/dL — ABNORMAL LOW (ref >39)
HDL: 35 mg/dL — ABNORMAL LOW (ref >39)
LDL Cholesterol: 143 mg/dL — ABNORMAL HIGH (ref 0–99)
LDL Cholesterol: 198 mg/dL — ABNORMAL HIGH (ref 0–99)
Total CHOL/HDL Ratio: 6.4 RATIO
Total CHOL/HDL Ratio: 7.3 RATIO
Triglycerides: 121 mg/dL (ref <150)
VLDL: 24 mg/dL (ref 0–40)

## 2011-01-04 LAB — CARDIAC PANEL(CRET KIN+CKTOT+MB+TROPI)
CK, MB: 199.8 ng/mL — ABNORMAL HIGH (ref 0.3–4.0)
CK, MB: 81.1 ng/mL — ABNORMAL HIGH (ref 0.3–4.0)
Relative Index: 7.4 — ABNORMAL HIGH (ref 0.0–2.5)
Total CK: 1091 U/L — ABNORMAL HIGH (ref 7–232)
Total CK: 1755 U/L — ABNORMAL HIGH (ref 7–232)
Total CK: 2056 U/L — ABNORMAL HIGH (ref 7–232)
Troponin I: 20.52 ng/mL (ref 0.00–0.06)
Troponin I: 29.25 ng/mL (ref 0.00–0.06)
Troponin I: 53.41 ng/mL (ref 0.00–0.06)

## 2011-01-04 LAB — BASIC METABOLIC PANEL
BUN: 9 mg/dL (ref 6–23)
Calcium: 9.5 mg/dL (ref 8.4–10.5)
GFR calc non Af Amer: >60 mL/min (ref >60)
Glucose, Bld: 128 mg/dL — ABNORMAL HIGH (ref 70–99)
Sodium: 141 mEq/L (ref 135–145)

## 2011-01-04 LAB — DIFFERENTIAL
Basophils Absolute: 0 10*3/uL (ref 0.0–0.1)
Lymphocytes Relative: 16 % (ref 12–46)
Lymphs Abs: 1.8 10*3/uL (ref 0.7–4.0)
Neutro Abs: 9.4 10*3/uL — ABNORMAL HIGH (ref 1.7–7.7)
Neutrophils Relative %: 81 % — ABNORMAL HIGH (ref 43–77)

## 2011-01-04 LAB — TSH: TSH: 1.016 u[IU]/mL (ref 0.350–4.500)

## 2011-02-05 ENCOUNTER — Telehealth: Payer: Self-pay | Admitting: Cardiology

## 2011-02-05 MED ORDER — ROSUVASTATIN CALCIUM 20 MG PO TABS: 20.0000 mg | ORAL_TABLET | Freq: Every day | ORAL | Status: DC

## 2011-02-05 MED ORDER — METOPROLOL SUCCINATE ER 25 MG PO TB24: 25.0000 mg | ORAL_TABLET | Freq: Every day | ORAL | Status: DC

## 2011-02-05 NOTE — Telephone Encounter (Signed)
Pt needs refill on Crestor, Wellbutrin, Toprol.  Has not seen anyone since [place] retired.  He is completely out of all meds.  Been out for 2 days.  Will have to [facility] because of insurance.  Call pt if any problem.

## 2011-02-09 NOTE — Assessment & Plan Note (Signed)
[facility]                                 ON-CALL NOTE   SHONDALE, QUINLEY               MRN:          [id number]  DATE:09/10/2007                            DOB:          [birth year]    The patient is calling because he is having severe chest pain. He is  light headed and passed out. He is advised to go to the emergency room  for evaluation.     Jeffrey A. Tawanna Cooler, MD  Electronically Signed    JAT/MedQ  DD: 09/10/2007  DT: 09/11/2007  Job #: [id number]

## 2011-02-09 NOTE — Cardiovascular Report (Signed)
William Mckenzie, William Mckenzie      ACCOUNT NO.:  [id number]   MEDICAL RECORD NO.:  [id number]          PATIENT TYPE:  INP   LOCATION:  2914                         FACILITY:  [facility]   PHYSICIAN:  Everardo Beals. [name], MD, FACCDATE OF BIRTH:  Nov 13, 1965   DATE OF PROCEDURE:  02/25/2009  DATE OF DISCHARGE:                            CARDIAC CATHETERIZATION   CLINICAL HISTORY:  [name] is a 45 year old and works with [employer].  He has no prior history of heart disease.  He has onset of  chest pain last night at 11:00 p.m. and came to the emergency room this  morning.  The ECG showed 1 mm inferior ST elevation.  He is seen by the  ED physician and Dr. Myrtis Ser, and he was transported to the cath lab as a  code STEMI.  He does have a positive family history for coronary heart  disease.   PROCEDURE:  The procedure was performed by the right femoral arteries,  arterial sheath, and 6-French preformed coronary catheters.  A front  wall arterial puncture was performed and Omnipaque contrast was used.  We did diagnostic left pictures first and went in with a JR-4 guide.  This demonstrated total occlusion of the posterior descending branch of  the right coronary artery and preparation was made for intervention.   The patient had been given heparin and 600 mg of Plavix and chewable  aspirin in the ED.  He was given bivalve and bolus infusion.  We passed  a Prowater wire down the right coronary artery and across the totally  occluded PDA without too much difficulty.  We dilated the lesion with a  2.25- x 15-mm apex balloon performing 3 inflations up to 8 atmospheres  for 30 seconds.  This reestablished flow.  We then deployed a 2.5- x 15-  mm Xience stent, deploying this with one inflation of 10 atmospheres for  30 seconds.  The proximal edge of the stent was located just at the  ostium of the PDA.  We then postdilated the stent with a 2.75- x 12-mm  [place] apex performing 3 inflations up to 16  atmospheres for 30 seconds.   The left ventriculogram was done at the end of the procedure.  The right  femoral artery was closed with Angio-Seal at the end of the procedure.  The patient tolerated the procedure well and left the laboratory in  satisfactory condition.   RESULTS:  The aortic pressure was 112/74 with the mean of 92 and left  ventricular pressure of 112/19.   The left main coronary artery.  The left main coronary artery was free  of significant disease.   Left anterior descending artery.  Please note the left descending artery  gave rise to large diagonal branch, large septal perforators and several  smaller diagonal branches and septal perforators.  The LAD was irregular  and there was 40-50% narrowing in the proximal vessel and 40% narrowing  in the midvessel.   The circumflex artery.  The circumflex artery gave rise to a large  marginal branch and AV branch was terminated at posterolateral branch.  There was 40-50% narrowing in the midportion of  the AV branch just after  the marginal branch.   The right coronary artery:  The right coronary artery was a dominant  vessel that gave rise to a large conus branch, a right ventricular  branch, a posterior descending branch, and 3 posterolateral branches.  Posterior descending branch was completely occluded at its origin with  TIMI-0 flow.   The left ventriculogram.  Please note, the left ventriculogram, which  was performed in RAO projection, which was performed after the  intervention showed inferoapical wall akinesis.  The rest of the wall  motion was good and the estimated ejection fraction was 55%.   Following PCI and stenting of the lesion in the ostium and posterior  descending branch of the right coronary stenosis improved from 100% to  0% and the flow improved from TIMI-0 to TIMI-III flow.   The patient had the onset of chest pain at 11:00 p.m. on Feb 24, 2009.  He arrived to the emergency room at 9:05 a.m..   He arrived to cath lab  at 10:37 a.m.  First balloon inflation was at 11:01.  This gave a total  balloon time of 116 minutes.   CONCLUSION:  1. Acute diaphragmatic wall myocardial infarction with total occlusion      of the posterior descending branch of the right coronary artery,      40% proximal and 40% mid stenosis in the left anterior descending,      40% mid stenosis of the circumflex artery and inferoapical wall      akinesis with estimated fraction of 55%.  2. Successful reperfusion and percutaneous coronary intervention of      the lesion in the posterior branch of the right coronary using a      drug-eluting stent with improvement of center narrowing from 100%      to 0% and improvement of flow from TIMI-0 to TIMI-III flow.   DISPOSITION:  The patient was returned to the postanesthesia room for  observation.  We will plan vigorous secondary risk factor modification.  He probably will go home on day 2.      Bruce Elvera Lennox [name], MD, [facility]  Electronically Signed     BRB/MEDQ  D:  02/25/2009  T:  02/25/2009  Job:  [id number]

## 2011-02-09 NOTE — Discharge Summary (Signed)
William Mckenzie, William Mckenzie      ACCOUNT NO.:  [id number]   MEDICAL RECORD NO.:  [id number]          PATIENT TYPE:  INP   LOCATION:  2914                         FACILITY:  [facility]   PHYSICIAN:  Everardo Beals. [name], MD, FACCDATE OF BIRTH:  [birth year]   DATE OF ADMISSION:  02/25/2009  DATE OF DISCHARGE:  02/27/2009                               DISCHARGE SUMMARY   PRIMARY CARDIOLOGIST:  Everardo Beals. [name], MD, [facility]   PRIMARY CARE [name]:  The patient used to see [name] but is  currently in between physicians.   DISCHARGE DIAGNOSIS:  Acute inferior ST-segment elevation myocardial  infarction.   SECONDARY DIAGNOSES:  1. Coronary artery disease status post successful percutaneous      coronary intervention and stenting of the posterior descending      artery with 2.5 x 15 mm XIENCE V drug-eluting stent this admission.  2. Hyperlipidemia.  3. Smokeless tobacco usage.  4. History of white coat hypertension.  5. Anxiety.   ALLERGIES:  No known drug allergies.   PROCEDURES:  1. Left heart cardiac catheterization revealing occluded posterior      descending artery with successful placement of 2.5 x 15 mm XIENCE      drug-eluting stent.  Ejection fraction was 55% with inferior apical      akinesis.  2. A 2-D echocardiogram performed on February 25, 2009, showed an ejection      fraction of 45-50% with mild left ventricular hypertrophy.   HISTORY OF PRESENT ILLNESS:  A 45 year old Caucasian male without prior  cardiac history who was in his usual state of health until 11:00 p.m. on  the evening of Feb 24, 2009, when he awoke with chest discomfort  associated with diaphoresis, nausea, and vomiting.  He was restless  through remainder of the night and subsequently presented to the [facility] ED on the morning of February 25, 2009.  ECG revealed inferior ST-  segment elevation, and bedside echo showed inferior wall abnormalities.  The patient was taken to the cath lab for management of acute  inferior  ST elevation MI.   HOSPITAL COURSE:  The patient underwent emergent cardiac catheterization  revealing a totally occluded right posterior descending artery with  otherwise nonobstructive disease.  EF was 55% with inferoapical  akinesis.  Attention was turned to the PDA, and this was successfully  stented with a 2.5 x 15 mm XIENCE V drug-eluting stent.  The patient  tolerated the procedure well and was monitored in the coronary intensive  care unit following PCI.  He subsequently ruled in for MI, eventually  peaking his CK at 2056 with MB of 199.8, and his troponin I at 53.41.  He was placed on aspirin, statin, Plavix, and beta-blocker therapy.  Followup 2-D echocardiogram was performed on February 25, 2009, showed an EF  of 45-50%.   William Mckenzie has been evaluated by cardiac rehab and has been  ambulating without recurrent symptoms or limitations.  He has been  counseled on the importance of all tobacco cessation, and we offered him  a prescription for Wellbutrin to assist in cessation, as well as with  his anxiety.  He is  being discharged home today in good condition.   DISCHARGE LABORATORY DATA:  Hemoglobin 14.8, hematocrit 42.4, WBC 9.7,  platelets 210.  Sodium 141, potassium 3.9, chloride 106, CO2 28, BUN 9,  creatinine 1.05, glucose 128.  CK 1091, MB 81.1, troponin I 20.52.  Total cholesterol 198, triglycerides 119, HDL 31, LDL 143.  TSH 1.016.   DISPOSITION:  The patient will be discharged home today in good  condition.   FOLLOWUP PLANS AND APPOINTMENTS:  We will arrange for followup with Dr.  Charlies Constable on March 19, 2009, at 3:45 p.m.  We have asked him to follow  up with his primary care [name] as scheduled.   DISCHARGE MEDICATIONS:  1. Aspirin 325 mg daily.  2. Plavix 75 mg daily.  3. Toprol-XL 25 mg daily.  4. Crestor 20 mg nightly.  5. Wellbutrin 150 mg daily x3 days, then 150 mg b.i.d.  6. Nitroglycerin 0.4 mg sublingual p.r.n. chest pain.   OUTSTANDING  LABORATORY STUDIES:  None.   DURATION OF DISCHARGE ENCOUNTER:  45 minutes including physician time.      [name], ANP      Bruce R. [name], MD, [facility]  Electronically Signed    CB/MEDQ  D:  02/27/2009  T:  02/27/2009  Job:  [id number]

## 2011-02-09 NOTE — H&P (Signed)
William Mckenzie, MUTSCHLER NO.:  [id number]   MEDICAL RECORD NO.:  [id number]          PATIENT TYPE:  INP   LOCATION:  2914                         FACILITY:  [facility]   PHYSICIAN:  Luis Abed, MD, FACCDATE OF BIRTH:  [birth year]   DATE OF ADMISSION:  02/25/2009  DATE OF DISCHARGE:                              HISTORY & PHYSICAL   PRIMARY CARDIOLOGIST:  Luis Abed, MD, [facility]   PRIMARY CARE PHYSICIAN:  [name], MD.  However, the patient  states he has recently left the practice and he is awaiting  reassignment.   REASON FOR BEING SEEN:  Ongoing chest pain.   HISTORY OF PRESENT ILLNESS:  A 45 year old obese Caucasian male with no  prior cardiac history who after eating a large meal woke up around 11  p.m. with complaints of chest pain, feeling hot, sweaty, and vomiting,  unable to go back to sleep.  The pain was continuous like indigestion  with some radiation to the back, laying down made it worse, that  occurred throughout the night.  As the result, he called primary care  physician's office who advised him to come to the emergency room.  In  the emergency room, the patient was initially seen by ER physician and  did show some ST elevation in lead III and aVF, although minor at 1-1.5  mm.  The patient was given sublingual nitroglycerin and 4 baby aspirin,  and we were asked to evaluate further.  The patient had a STAT  echocardiogram in the emergency room, read by Dr. Myrtis Ser who did notice  that there were some inferior wall abnormalities and the patient is  going to be started on heparin after a bolus and taken to cardiac  catheterization labs.  Currently, the patient is having some chest  discomfort and burning, although it is not severe at present.   PAST MEDICAL HISTORY:  Hypercholesterolemia, being treated with diet and  exercise.   PAST SURGICAL HISTORY:  None.   SOCIAL HISTORY:  He lives in [place] with his wife.  He is an [employer]  account  executive.  He is married.  He does not smoke, but he does chew  tobacco, occasional alcohol, and no drug use.   FAMILY HISTORY:  Mother with fibromyalgia, hypertension, and  hypercholesterolemia.  Father with premature CAD along with uncles dying  in their 52s and 40s with CAD.  Siblings, he has sisters who are in good  health.   CURRENT MEDICATIONS AT HOME:  None.   ALLERGIES:  None.   REVIEW OF SYSTEMS:  Positive for chest pain, indigestion, nausea,  vomiting, and diaphoresis.  All other systems are reviewed and found to  be negative.   CURRENT LABORATORY DATA:  Hemoglobin 16.0, hematocrit 46.6, white blood  cells 11.6, and platelets 226.  Chest x-ray revealing no acute  abnormalities.  EKG revealing normal sinus rhythm with inferior ST  elevation in lead III and aVF, rate of 59 beats per minute.   PHYSICAL EXAMINATION:  VITAL SIGNS:  Blood pressure 133/79, pulse 57,  respirations 17, temperature 97.1, O2 sat 94% on room air.  GENERAL:  He is awake,  alert, and oriented in no acute distress at  present, responding appropriately.  HEENT:  Head is normocephalic and atraumatic.  Eyes, PERRLA.  Mucous  membranes, mouth, pink and moist.  Tongue is midline.  NECK:  Supple.  There is no JVD or carotid bruits appreciated.  CARDIOVASCULAR:  Regular rate and rhythm without murmurs, rubs, or  gallops.  LUNGS:  Clear to auscultation.  ABDOMEN:  Obese, nontender.  Bowel sounds 2+.  EXTREMITIES:  Without clubbing, cyanosis, or edema.  No femoral bruits  are appreciated.  SKIN:  Warm and dry.  NEUROLOGIC:  Intact.   IMPRESSION:  Chest pain, probable ST-elevated inferior myocardial  infarction.  The patient has been seen and examined by Dr. Willa Rough  and myself.  STAT echocardiogram was completed at bedside and found to  have inferior T-wave abnormalities.  The patient has been started on  heparin after a bolus and is being transferred to the cardiac  catheterization lab for cardiac  catheterization as soon as possible.  No  Plavix is given, however, he will be started on aspirin and heparin,  more recommendations throughout hospital course.  This has been  explained to the patient who verbalizes understanding along with his  wife who is willing to proceed and be admitted.      [name]. Lyman Bishop, NP      Luis Abed, MD, [facility]  Electronically Signed    KML/MEDQ  D:  02/25/2009  T:  02/26/2009  Job:  [id number]   cc:   [name], M.D.

## 2011-04-14 ENCOUNTER — Encounter: Payer: Self-pay | Admitting: Internal Medicine

## 2011-04-16 ENCOUNTER — Encounter: Payer: Self-pay | Admitting: Internal Medicine

## 2011-04-16 ENCOUNTER — Ambulatory Visit (INDEPENDENT_AMBULATORY_CARE_PROVIDER_SITE_OTHER): Payer: 59 | Admitting: Internal Medicine

## 2011-04-16 VITALS — BP 140/86 | HR 73 | Wt 255.0 lb

## 2011-04-16 DIAGNOSIS — F329 Major depressive disorder, single episode, unspecified: Secondary | ICD-10-CM | POA: Insufficient documentation

## 2011-04-16 DIAGNOSIS — F419 Anxiety disorder, unspecified: Secondary | ICD-10-CM

## 2011-04-16 DIAGNOSIS — R229 Localized swelling, mass and lump, unspecified: Secondary | ICD-10-CM

## 2011-04-16 DIAGNOSIS — F32A Depression, unspecified: Secondary | ICD-10-CM | POA: Insufficient documentation

## 2011-04-16 DIAGNOSIS — I2510 Atherosclerotic heart disease of native coronary artery without angina pectoris: Secondary | ICD-10-CM

## 2011-04-16 DIAGNOSIS — R2240 Localized swelling, mass and lump, unspecified lower limb: Secondary | ICD-10-CM | POA: Insufficient documentation

## 2011-04-16 DIAGNOSIS — F411 Generalized anxiety disorder: Secondary | ICD-10-CM

## 2011-04-16 MED ORDER — BUPROPION HCL ER (SR) 150 MG PO TB12: 150.0000 mg | ORAL_TABLET | Freq: Two times a day (BID) | ORAL | Status: DC

## 2011-04-16 NOTE — Assessment & Plan Note (Signed)
slt asymetry proximal tibia, XRs

## 2011-04-16 NOTE — Assessment & Plan Note (Signed)
See history of present illness, he was handling stress much better while he was on Wellbutrin. We'll restart it. No depression.

## 2011-04-16 NOTE — Progress Notes (Signed)
  Subjective:    Patient ID: William Mckenzie, male    DOB: Apr 04, 1966, 45 y.o.   MRN: [id number]  HPI  Discontinue Wellbutrin about 6 weeks ago, since then he is not  handling stress as well as he did before, would like to go back. Started Wellbutrin about 2 years ago after his heart attack, initially to help him quit tobacco. He did quit tobacco. Also there is some asymmetry at the  right knee, area seems swollen but is not tender  Past Medical History  Diagnosis Date  . CAD (coronary artery disease)     successful percutaneous  . Hyperlipidemia   . Hypertension     white coat   . Anxiety    Past Surgical History  Procedure Date  . Knee surgery 12-2010    LEFT, meniscus . arthroscopy    Review of Systems No depression bursae No chest pain, shortness of breath, nausea, vomiting or diarrhea.     Objective:   Physical Exam  Constitutional: He is oriented to person, place, and time. He appears well-developed and well-nourished. No distress.  HENT:  Head: Normocephalic and atraumatic.  Cardiovascular: Normal rate, regular rhythm and normal heart sounds.   No murmur heard. Pulmonary/Chest: Effort normal and breath sounds normal. He has no wheezes. He has no rales.  Musculoskeletal:       Legs: Neurological: He is alert and oriented to person, place, and time.  Skin: He is not diaphoretic.          Assessment & Plan:

## 2011-04-22 ENCOUNTER — Telehealth: Payer: Self-pay | Admitting: Internal Medicine

## 2011-04-22 NOTE — Telephone Encounter (Signed)
Prior auth approved from 04/01/11-04/21/12

## 2011-04-27 ENCOUNTER — Ambulatory Visit (INDEPENDENT_AMBULATORY_CARE_PROVIDER_SITE_OTHER)
Admission: RE | Admit: 2011-04-27 | Discharge: 2011-04-27 | Disposition: A | Discharge status: Home / Self Care | Payer: 59 | Source: Ambulatory Visit | Attending: Internal Medicine | Admitting: Internal Medicine

## 2011-04-27 DIAGNOSIS — R2240 Localized swelling, mass and lump, unspecified lower limb: Secondary | ICD-10-CM

## 2011-04-27 DIAGNOSIS — R229 Localized swelling, mass and lump, unspecified: Secondary | ICD-10-CM

## 2011-05-03 NOTE — Progress Notes (Signed)
Pt aware of results 

## 2011-05-19 ENCOUNTER — Other Ambulatory Visit: Payer: Self-pay | Admitting: Internal Medicine

## 2011-05-19 MED ORDER — ROSUVASTATIN CALCIUM 20 MG PO TABS: 20.0000 mg | ORAL_TABLET | Freq: Every day | ORAL | Status: DC

## 2011-05-19 MED ORDER — METOPROLOL SUCCINATE ER 25 MG PO TB24: 25.0000 mg | ORAL_TABLET | Freq: Every day | ORAL | Status: DC

## 2011-05-19 NOTE — Telephone Encounter (Signed)
Bupropion request denied[last refill 04/16/2011 #180x1] Other Rx[s] Done.

## 2011-08-27 ENCOUNTER — Other Ambulatory Visit: Payer: Self-pay | Admitting: Internal Medicine

## 2011-08-27 MED ORDER — BUPROPION HCL ER (SR) 150 MG PO TB12: 150.0000 mg | ORAL_TABLET | Freq: Two times a day (BID) | ORAL | Status: DC

## 2011-08-27 NOTE — Telephone Encounter (Signed)
Done

## 2011-09-22 ENCOUNTER — Encounter: Payer: Self-pay | Admitting: Cardiovascular Disease

## 2011-09-22 ENCOUNTER — Ambulatory Visit (INDEPENDENT_AMBULATORY_CARE_PROVIDER_SITE_OTHER): Payer: 59 | Admitting: Cardiovascular Disease

## 2011-09-22 DIAGNOSIS — E785 Hyperlipidemia, unspecified: Secondary | ICD-10-CM

## 2011-09-22 DIAGNOSIS — E7800 Pure hypercholesterolemia, unspecified: Secondary | ICD-10-CM

## 2011-09-22 DIAGNOSIS — I2510 Atherosclerotic heart disease of native coronary artery without angina pectoris: Secondary | ICD-10-CM

## 2011-09-22 MED ORDER — ROSUVASTATIN CALCIUM 20 MG PO TABS: 20.0000 mg | ORAL_TABLET | Freq: Every day | ORAL | Status: DC

## 2011-09-22 MED ORDER — NITROGLYCERIN 0.4 MG SL SUBL: 0.4000 mg | SUBLINGUAL_TABLET | SUBLINGUAL | Status: DC | PRN

## 2011-09-22 NOTE — Patient Instructions (Signed)
Your physician recommends that you return for a FASTING lipid and liver profile in 6 WEEKS--nothing to eat or drink after midnight, lab opens at 8:30.  Your physician has requested that you have an exercise tolerance test in 6 WEEKS. For further information please visit [email or web address] Please also follow instruction sheet, as given.  Your physician wants you to follow-up in: 1 YEAR with Dr Excell Seltzer. You will receive a reminder letter in the mail two months in advance. If you don't receive a letter, please call our office to schedule the follow-up appointment.

## 2011-09-22 NOTE — Progress Notes (Signed)
HPI:  45 year old gentleman presented for followup evaluation. The patient has coronary artery disease and sustained an inferior MI in 2010. He was treated with stenting of the right PDA. A drug-eluting stent platform was utilized. The patient initially lost about 50 pounds but has regained much of his weight back. He has no symptoms with exertion. He exercises about 3 times weekly and participates in martial arts.  He has no symptoms with exertion. He specifically denies exertional chest pain or pressure. He denies dyspnea, edema, palpitations, lightheadedness, or syncope. He ran out of Crestor about 2 weeks ago, but otherwise is compliant with his medications. He has been off of Plavix for over one year.  I reviewed his cardiac catheterization report from 2010 and he had total occlusion of the right PDA. He had nonobstructive disease of the LAD and left circumflex with approximately 40% lesions in both vessels. His left ventricular ejection fraction has been normal.  Outpatient Encounter Prescriptions as of 09/22/2011  Medication Sig Dispense Refill  . aspirin 325 MG tablet Take 325 mg by mouth daily.        Marland Kitchen buPROPion (WELLBUTRIN SR) 150 MG 12 hr tablet Take 1 tablet (150 mg total) by mouth 2 (two) times daily.  180 tablet  1  . metoprolol succinate (TOPROL XL) 25 MG 24 hr tablet Take 1 tablet (25 mg total) by mouth daily.  90 tablet  3  . nitroGLYCERIN (NITROSTAT) 0.4 MG SL tablet Place 1 tablet (0.4 mg total) under the tongue every 5 (five) minutes as needed.  25 tablet  2  . DISCONTD: aspirin 81 MG tablet Take 325 mg by mouth daily.       Marland Kitchen DISCONTD: nitroGLYCERIN (NITROSTAT) 0.4 MG SL tablet Place 0.4 mg under the tongue every 5 (five) minutes as needed.        . rosuvastatin (CRESTOR) 20 MG tablet Take 1 tablet (20 mg total) by mouth daily.  90 tablet  3  . DISCONTD: rosuvastatin (CRESTOR) 20 MG tablet Take 1 tablet (20 mg total) by mouth daily.  30 tablet  5    No Known Allergies  Past  Medical History  Diagnosis Date  . CAD (coronary artery disease)     successful percutaneous  . Hyperlipidemia   . Hypertension     white coat   . Anxiety     ROS: Negative except as per HPI  BP 128/84  Pulse 64  Ht 6' (1.829 m)  Wt 114.941 kg (253 lb 6.4 oz)  BMI 34.37 kg/m2  PHYSICAL EXAM: Pt is alert and oriented, overweight male in NAD HEENT: normal Neck: JVP - normal, carotids 2+= without bruits Lungs: CTA bilaterally CV: RRR without murmur or gallop Abd: soft, NT, Positive BS, no hepatomegaly Ext: no C/C/E, distal pulses intact and equal Skin: warm/dry no rash  EKG:  Normal sinus rhythm 64 beats per minute, within normal limits.  ASSESSMENT AND PLAN:

## 2011-09-22 NOTE — Assessment & Plan Note (Signed)
The patient is stable without angina. He should continue on antiplatelet therapy with aspirin. He can reduce his aspirin dose to 81 mg. He should continue on metoprolol and Crestor. I recommended an exercise treadmill test for surveillance of his coronary artery disease.

## 2011-09-22 NOTE — Assessment & Plan Note (Signed)
The patient is due for lipids and LFTs. He has been off of Crestor for 2 weeks I recommended that he resume his medicine and then we will repeat his lipid panel in about 6 weeks when he comes in for his treadmill test. I would like to see him back in one year for followup.

## 2011-09-24 NOTE — Progress Notes (Signed)
Addended by: Judithe Modest D on: 09/24/2011 03:37 PM   Modules accepted: Orders

## 2011-11-11 ENCOUNTER — Other Ambulatory Visit (INDEPENDENT_AMBULATORY_CARE_PROVIDER_SITE_OTHER): Payer: 59 | Admitting: *Deleted

## 2011-11-11 ENCOUNTER — Encounter: Payer: 59 | Admitting: Cardiovascular Disease

## 2011-11-11 DIAGNOSIS — I2510 Atherosclerotic heart disease of native coronary artery without angina pectoris: Secondary | ICD-10-CM

## 2011-11-11 DIAGNOSIS — E7800 Pure hypercholesterolemia, unspecified: Secondary | ICD-10-CM

## 2011-11-11 LAB — HEPATIC FUNCTION PANEL
ALT: 31 U/L (ref 0–53)
AST: 21 U/L (ref 0–37)
Albumin: 4.4 g/dL (ref 3.5–5.2)
Total Bilirubin: 0.7 mg/dL (ref 0.3–1.2)
Total Protein: 7.3 g/dL (ref 6.0–8.3)

## 2011-11-11 LAB — LIPID PANEL
Cholesterol: 144 mg/dL (ref 0–200)
Triglycerides: 128 mg/dL (ref 0.0–149.0)

## 2011-11-15 ENCOUNTER — Ambulatory Visit (INDEPENDENT_AMBULATORY_CARE_PROVIDER_SITE_OTHER): Payer: 59 | Admitting: Internal Medicine

## 2011-11-15 VITALS — BP 148/88 | HR 61 | Temp 98.7°F | Wt 257.0 lb

## 2011-11-15 DIAGNOSIS — IMO0002 Reserved for concepts with insufficient information to code with codable children: Secondary | ICD-10-CM

## 2011-11-15 MED ORDER — PREDNISONE 10 MG PO TABS: ORAL_TABLET | ORAL | Status: DC

## 2011-11-15 NOTE — Patient Instructions (Signed)
Take prednisone as prescribed Tylenol as needed Call if no better in 2 weeks

## 2011-11-15 NOTE — Progress Notes (Signed)
  Subjective:    Patient ID: William Mckenzie, male    DOB: December 05, 1965, 46 y.o.   MRN: [id number]  HPI Acute visit 2 weeks ago developed pain at the  right deltoid area, the pain is sharp, intense and triggered by elevating his right arm.  Past Medical History  Diagnosis Date  . CAD (coronary artery disease)     successful percutaneous  . Hyperlipidemia   . Hypertension     white coat   . Anxiety      Review of Systems He practices martial arts but does not recall any specific injury or direct impact in that area. Denies any pain in the elbow per se or swelling       Objective:   Physical Exam Alert, oriented x3. No apparent distress. Shoulders symmetric and nontender to palpation. Left are normal Right arm: Normal to palpation, biceps and triceps normal to palpation. Elbow without any redness or swelling.     Assessment & Plan:   arm sprain: Symptoms most likely from a deltoid sprain. He needs anti-inflammatories, we'll avoid NSAIDs which could interact with aspirin and prescribe a short treatment with prednisone. See instructions. If not better, he will need further evaluation by orthopedics.

## 2011-11-16 ENCOUNTER — Encounter: Payer: Self-pay | Admitting: Internal Medicine

## 2011-11-24 ENCOUNTER — Encounter: Payer: 59 | Admitting: Nurse Practitioner

## 2011-12-03 ENCOUNTER — Ambulatory Visit (INDEPENDENT_AMBULATORY_CARE_PROVIDER_SITE_OTHER): Payer: 59 | Admitting: Nurse Practitioner

## 2011-12-03 ENCOUNTER — Encounter: Payer: Self-pay | Admitting: Nurse Practitioner

## 2011-12-03 VITALS — BP 130/85 | HR 77 | Ht 72.0 in | Wt 253.0 lb

## 2011-12-03 DIAGNOSIS — I2510 Atherosclerotic heart disease of native coronary artery without angina pectoris: Secondary | ICD-10-CM

## 2011-12-03 MED ORDER — METOPROLOL SUCCINATE ER 25 MG PO TB24: 25.0000 mg | ORAL_TABLET | Freq: Every day | ORAL | Status: DC

## 2011-12-03 NOTE — Patient Instructions (Signed)
Your ETT looks good.  I have refilled the Toprol. We will see you in one year.

## 2011-12-03 NOTE — Progress Notes (Signed)
Exercise Treadmill Test  Pre-Exercise Testing Evaluation Rhythm: normal sinus  Rate: 77   PR:  .17 QRS:  .09  QT:  .38 QTc: .43     Test  Exercise Tolerance Test Ordering MD: Tonny Bollman, MD  Interpreting MD:  Norma Fredrickson NP  Unique Test No: 1  Treadmill:  1  Indication for ETT: known ASHD  Contraindication to ETT: No   Stress Modality: exercise - treadmill  Cardiac Imaging Performed: non   Protocol: standard Bruce - maximal  Max BP:  215/77  Max MPHR (bpm):  175 85% MPR (bpm):  148  MPHR obtained (bpm):  173 % MPHR obtained:  88%  Reached 85% MPHR (min:sec):  6:30 Total Exercise Time (min-sec):  10:00  Workload in METS:  11.9 Borg Scale: 12  Reason ETT Terminated:  fatigue    ST Segment Analysis At Rest: normal ST segments - no evidence of significant ST depression With Exercise: no evidence of significant ST depression  Other Information Arrhythmia:  No Angina during ETT:  absent (0) Quality of ETT:  diagnostic  ETT Interpretation:  normal - no evidence of ischemia by ST analysis  Comments: Patient exercised on the standard Bruce protocol for follow up of his known CAD. Exercised a total of 10 minutes. Maximum heart rate175. Blood pressure rose to 215/77. Clinically negative for angina. EKG negative for ischemia.   Recommendations: Continue with current medicines. I have refilled your Toprol today.  Daily exercise is encouraged. We will see you back as needed otherwise we will see you in one year.

## 2012-01-13 ENCOUNTER — Telehealth: Payer: Self-pay | Admitting: Internal Medicine

## 2012-01-13 NOTE — Telephone Encounter (Signed)
Patient came into the office today requesting new rx for buproprion hcl sr tabs 150mg . Take 1 tablet twice a day. Qty 180. [facility] and [facility].

## 2012-01-14 MED ORDER — BUPROPION HCL ER (SR) 150 MG PO TB12: 150.0000 mg | ORAL_TABLET | Freq: Two times a day (BID) | ORAL | Status: DC

## 2012-01-14 NOTE — Telephone Encounter (Signed)
Refill done.  

## 2012-10-24 ENCOUNTER — Telehealth: Payer: Self-pay | Admitting: *Deleted

## 2012-10-24 MED ORDER — OSELTAMIVIR PHOSPHATE 75 MG PO CAPS: 75.0000 mg | ORAL_CAPSULE | Freq: Two times a day (BID) | ORAL | Status: DC

## 2012-10-24 NOTE — Telephone Encounter (Signed)
Patient came to office, dtr is currently being treated for the flu. He woke up this morning with flu-like sx and wanted to know if we can call in Tamiflu for him. Advised pt that MD was currently seeing pts and it would probably be after 5pm before MD addresses this. Pt verbalized understanding.

## 2012-10-24 NOTE — Telephone Encounter (Signed)
At this point, we won't be able to see the patient and his he is probably  developing the flu he needs treatment. Advise patient: Call Tamiflu 75 mg one by mouth twice a day for 5 days. Rest, fluids, Tylenol, office visit or go to the ER if symptoms increase. Additionally, I haven't seen him for a regular checkup   in more than one year, schedule a physical please

## 2012-10-24 NOTE — Telephone Encounter (Signed)
Left detailed msg on pt's vmail & sent rx to pharmacy.  

## 2012-10-27 ENCOUNTER — Ambulatory Visit (INDEPENDENT_AMBULATORY_CARE_PROVIDER_SITE_OTHER): Payer: 59 | Admitting: Internal Medicine

## 2012-10-27 ENCOUNTER — Ambulatory Visit (INDEPENDENT_AMBULATORY_CARE_PROVIDER_SITE_OTHER)
Admission: RE | Admit: 2012-10-27 | Discharge: 2012-10-27 | Disposition: A | Discharge status: Home / Self Care | Payer: 59 | Source: Ambulatory Visit | Attending: Internal Medicine | Admitting: Internal Medicine

## 2012-10-27 VITALS — BP 134/84 | HR 65 | Temp 98.1°F | Wt 258.0 lb

## 2012-10-27 DIAGNOSIS — R05 Cough: Secondary | ICD-10-CM

## 2012-10-27 DIAGNOSIS — R059 Cough, unspecified: Secondary | ICD-10-CM

## 2012-10-27 MED ORDER — DOXYCYCLINE HYCLATE 100 MG PO TABS: 100.0000 mg | ORAL_TABLET | Freq: Two times a day (BID) | ORAL | Status: DC

## 2012-10-27 MED ORDER — HYDROCODONE-HOMATROPINE 5-1.5 MG/5ML PO SYRP: 5.0000 mL | ORAL_SOLUTION | Freq: Four times a day (QID) | ORAL | Status: DC | PRN

## 2012-10-27 NOTE — Patient Instructions (Addendum)
Please get your x-ray at the other [place]  office located at: [address] [place], across from [facility].  Please go to the basement, this is a walk-in facility, they are open from 8:30 to 5:30 PM. Phone number [phone]. --- Rest, fluids , tylenol For cough, take Mucinex DM twice a day as needed  If the cough continue, use hydrocodone 4 times a day as needed. Will cause drowsiness  Take the antibiotic as prescribed  (doxycycline) Call if no better in few days Call anytime or go to the ER if the symptoms are severe, you have high fever, short of breath ---- Please schedule a complete physical at your earliest convenience

## 2012-10-27 NOTE — Assessment & Plan Note (Addendum)
Cough 2 weeks, much more intense for the last 3 days. History of influenza exposure ---->  on Tamiflu. DDX includes bronchitis, pneumonia, pertussis? Plan: Chest x-ray Cough control with Mucinex DM and hydrocodone Doxycycline, consider Avelox if pneumonia in the chest x-ray

## 2012-10-27 NOTE — Progress Notes (Signed)
  Subjective:    Patient ID: William Mckenzie, male    DOB: 12-16-1965, 47 y.o.   MRN: [id number]  HPI Acute visit Has been coughing for about 2 weeks, few days ago his daughter tested positive for the flu and he started Tamiflu. In the last 2 days his cough has been much more intense and persistent. He has a history of passing out with intense cough and is concerned about it  Past Medical History  Diagnosis Date  . CAD (coronary artery disease)     prior inferior MI in 2010 treated with stenting of the right PDA; had nonobstructive disease in the LAD and LCX with approximately 40% lesions. EF normal  . Hyperlipidemia   . Hypertension     white coat   . Anxiety   . Obesity    Past Surgical History  Procedure Date  . Knee surgery 12-2010    LEFT, meniscus . arthroscopy     Review of Systems He had fever earlier this week as high as 99 and he still has some chills. No nausea, vomiting, diarrhea. No GERD symptoms. No chest pain except when he coughs. Denies any wheezing, no history of asthma. No recent lower extremity edema    Objective:   Physical Exam General -- alert, well-developed, sweaty , not toxic, frequent intense cough noted. HEENT -- TMs normal, throat w/o redness, face symmetric and not tender to palpation, nose not congested  Lungs -- normal respiratory effort, no intercostal retractions, no accessory muscle use, and normal breath sounds. No wheezing, no crackles.  Heart-- normal rate, regular rhythm, no murmur, and no gallop.   Neurologic-- alert & oriented X3 and strength normal in all extremities. Psych-- Cognition and judgment appear intact. Alert and cooperative with normal attention span and concentration.  not anxious appearing and not depressed appearing.       Assessment & Plan:

## 2012-10-29 ENCOUNTER — Encounter: Payer: Self-pay | Admitting: Internal Medicine

## 2012-10-30 ENCOUNTER — Telehealth: Payer: Self-pay | Admitting: Cardiovascular Disease

## 2012-10-30 MED ORDER — ROSUVASTATIN CALCIUM 20 MG PO TABS: 20.0000 mg | ORAL_TABLET | Freq: Every day | ORAL | Status: DC

## 2012-10-30 NOTE — Telephone Encounter (Signed)
Patient want refill on his Crestor 20mg  tab.  He says that he usually gets 3 mo supply.  He did not take it yesterday or today because he is out.

## 2012-10-30 NOTE — Telephone Encounter (Signed)
Pt aware Rx sent to pharmacy 

## 2012-10-31 ENCOUNTER — Telehealth: Payer: Self-pay | Admitting: Internal Medicine

## 2012-10-31 MED ORDER — BUPROPION HCL ER (SR) 150 MG PO TB12: 150.0000 mg | ORAL_TABLET | Freq: Two times a day (BID) | ORAL | Status: DC

## 2012-10-31 NOTE — Telephone Encounter (Signed)
Refill done.  

## 2012-10-31 NOTE — Telephone Encounter (Signed)
Pt requests 90 day supply

## 2012-10-31 NOTE — Telephone Encounter (Signed)
Patient came into office today to request new rx for bupriopion 150mg  12hr tablet. Patient has cpe scheduled for April 2014. [facility] uses [facility]

## 2012-11-22 ENCOUNTER — Encounter: Payer: Self-pay | Admitting: Internal Medicine

## 2012-11-22 ENCOUNTER — Ambulatory Visit (INDEPENDENT_AMBULATORY_CARE_PROVIDER_SITE_OTHER): Payer: 59 | Admitting: Internal Medicine

## 2012-11-22 VITALS — BP 138/84 | HR 67 | Temp 98.1°F | Wt 248.0 lb

## 2012-11-22 DIAGNOSIS — E1159 Type 2 diabetes mellitus with other circulatory complications: Secondary | ICD-10-CM | POA: Insufficient documentation

## 2012-11-22 DIAGNOSIS — IMO0001 Reserved for inherently not codable concepts without codable children: Secondary | ICD-10-CM

## 2012-11-22 LAB — GLUCOSE, POCT (MANUAL RESULT ENTRY): POC Glucose: 408 mg/dl — AB (ref 70–99)

## 2012-11-22 LAB — CBC WITH DIFFERENTIAL/PLATELET
Basophils Relative: 0.3 % (ref 0.0–3.0)
Eosinophils Relative: 1 % (ref 0.0–5.0)
HCT: 45.9 % (ref 39.0–52.0)
Hemoglobin: 15.6 g/dL (ref 13.0–17.0)
Lymphs Abs: 3 10*3/uL (ref 0.7–4.0)
MCV: 90.3 fl (ref 78.0–100.0)
Monocytes Absolute: 0.5 10*3/uL (ref 0.1–1.0)
Monocytes Relative: 6.7 % (ref 3.0–12.0)
Platelets: 195 10*3/uL (ref 150.0–400.0)
RBC: 5.09 Mil/uL (ref 4.22–5.81)
WBC: 7.1 10*3/uL (ref 4.5–10.5)

## 2012-11-22 LAB — POCT URINALYSIS DIPSTICK
Bilirubin, UA: NEGATIVE
Blood, UA: NEGATIVE
Glucose, UA: 2000
Nitrite, UA: NEGATIVE
Spec Grav, UA: <=1.005
Urobilinogen, UA: 0.2

## 2012-11-22 LAB — HEMOGLOBIN A1C: Hgb A1c MFr Bld: 12.2 % — ABNORMAL HIGH (ref 4.6–6.5)

## 2012-11-22 LAB — COMPREHENSIVE METABOLIC PANEL
Alkaline Phosphatase: 97 U/L (ref 39–117)
BUN: 13 mg/dL (ref 6–23)
CO2: 29 mEq/L (ref 19–32)
Creatinine, Ser: 1.2 mg/dL (ref 0.4–1.5)
GFR: 70.39 mL/min (ref >60.00)
Glucose, Bld: 416 mg/dL — ABNORMAL HIGH (ref 70–99)
Sodium: 138 mEq/L (ref 135–145)
Total Bilirubin: 1 mg/dL (ref 0.3–1.2)

## 2012-11-22 MED ORDER — GLIMEPIRIDE 4 MG PO TABS: 4.0000 mg | ORAL_TABLET | Freq: Every day | ORAL | Status: DC

## 2012-11-22 MED ORDER — METFORMIN HCL 500 MG PO TABS: 500.0000 mg | ORAL_TABLET | Freq: Two times a day (BID) | ORAL | Status: DC

## 2012-11-22 MED ORDER — GLUCOSE BLOOD VI STRP: ORAL_STRIP | Status: DC

## 2012-11-22 MED ORDER — ONETOUCH LANCETS MISC: Status: AC

## 2012-11-22 NOTE — Patient Instructions (Addendum)
Check your blood sugar 3 times a day at different times. Keep a log. Bring the log to the office in 2 weeks. Watch for low sugar symptoms Get the book "diabetes for dummies" Take glimepiride in the morning, metformin twice a day, call if side effects. Next visit in 6 weeks   Diabetes Meal Planning Guide The diabetes meal planning guide is a tool to help you plan your meals and snacks. It is important for people with diabetes to manage their blood glucose (sugar) levels. Choosing the right foods and the right amounts throughout your day will help control your blood glucose. Eating right can even help you improve your blood pressure and reach or maintain a healthy weight. CARBOHYDRATE COUNTING MADE EASY When you eat carbohydrates, they turn to sugar. This raises your blood glucose level. Counting carbohydrates can help you control this level so you feel better. When you plan your meals by counting carbohydrates, you can have more flexibility in what you eat and balance your medicine with your food intake. Carbohydrate counting simply means adding up the total amount of carbohydrate grams in your meals and snacks. Try to eat about the same amount at each meal. Foods with carbohydrates are listed below. Each portion below is 1 carbohydrate serving or 15 grams of carbohydrates. Ask your dietician how many grams of carbohydrates you should eat at each meal or snack. Grains and Starches  1 slice bread.   English muffin or hotdog/hamburger bun.   cup cold cereal (unsweetened).   cup cooked pasta or rice.   cup starchy vegetables (corn, potatoes, peas, beans, winter squash).  1 tortilla (6 inches).   bagel.  1 waffle or pancake (size of a CD).   cup cooked cereal.  4 to 6 small crackers. *Whole grain is recommended. Fruit  1 cup fresh unsweetened berries, melon, papaya, pineapple.  1 small fresh fruit.   banana or mango.   cup fruit juice (4 oz unsweetened).   cup canned  fruit in natural juice or water.  2 tbs dried fruit.  12 to 15 grapes or cherries. Milk and Yogurt  1 cup fat-free or 1% milk.  1 cup soy milk.  6 oz light yogurt with sugar-free sweetener.  6 oz low-fat soy yogurt.  6 oz plain yogurt. Vegetables  1 cup raw or  cup cooked is counted as 0 carbohydrates or a "free" food.  If you eat 3 or more servings at 1 meal, count them as 1 carbohydrate serving. Other Carbohydrates   oz chips or pretzels.   cup ice cream or frozen yogurt.   cup sherbet or sorbet.  2 inch square cake, no frosting.  1 tbs honey, sugar, jam, jelly, or syrup.  2 small cookies.  3 squares of graham crackers.  3 cups popcorn.  6 crackers.  1 cup broth-based soup.  Count 1 cup casserole or other mixed foods as 2 carbohydrate servings.  Foods with less than 20 calories in a serving may be counted as 0 carbohydrates or a "free" food. You may want to purchase a book or computer software that lists the carbohydrate gram counts of different foods. In addition, the nutrition facts panel on the labels of the foods you eat are a good source of this information. The label will tell you how big the serving size is and the total number of carbohydrate grams you will be eating per serving. Divide this number by 15 to obtain the number of carbohydrate servings in a portion.  Remember, 1 carbohydrate serving equals 15 grams of carbohydrate. SERVING SIZES Measuring foods and serving sizes helps you make sure you are getting the right amount of food. The list below tells how big or small some common serving sizes are.  1 oz.........4 stacked dice.  3 oz........Marland KitchenDeck of cards.  1 tsp.......Marland KitchenTip of little finger.  1 tbs......Marland KitchenMarland KitchenThumb.  2 tbs.......Marland KitchenGolf ball.   cup......Marland KitchenHalf of a fist.  1 cup.......Marland KitchenA fist. SAMPLE DIABETES MEAL PLAN Below is a sample meal plan that includes foods from the grain and starches, dairy, vegetable, fruit, and meat groups. A  dietician can individualize a meal plan to fit your calorie needs and tell you the number of servings needed from each food group. However, controlling the total amount of carbohydrates in your meal or snack is more important than making sure you include all of the food groups at every meal. You may interchange carbohydrate containing foods (dairy, starches, and fruits). The meal plan below is an example of a 2000 calorie diet using carbohydrate counting. This meal plan has 17 carbohydrate servings. Breakfast  1 cup oatmeal (2 carb servings).   cup light yogurt (1 carb serving).  1 cup blueberries (1 carb serving).   cup almonds. Snack  1 large apple (2 carb servings).  1 low-fat string cheese stick. Lunch  Chicken breast salad.  1 cup spinach.   cup chopped tomatoes.  2 oz chicken breast, sliced.  2 tbs low-fat [place] dressing.  12 whole-wheat crackers (2 carb servings).  12 to 15 grapes (1 carb serving).  1 cup low-fat milk (1 carb serving). Snack  1 cup carrots.   cup hummus (1 carb serving). Dinner  3 oz broiled salmon.  1 cup brown rice (3 carb servings). Snack  1  cups steamed broccoli (1 carb serving) drizzled with 1 tsp olive oil and lemon juice.  1 cup light pudding (2 carb servings). DIABETES MEAL PLANNING WORKSHEET Your dietician can use this worksheet to help you decide how many servings of foods and what types of foods are right for you.  BREAKFAST Food Group and Servings / Carb Servings Grain/Starches __________________________________ Dairy __________________________________________ Vegetable ______________________________________ Fruit ___________________________________________ Meat __________________________________________ Fat ____________________________________________ LUNCH Food Group and Servings / Carb Servings Grain/Starches ___________________________________ Dairy ___________________________________________ Fruit  ____________________________________________ Meat ___________________________________________ Fat _____________________________________________ Laural Golden Food Group and Servings / Carb Servings Grain/Starches ___________________________________ Dairy ___________________________________________ Fruit ____________________________________________ Meat ___________________________________________ Fat _____________________________________________ SNACKS Food Group and Servings / Carb Servings Grain/Starches ___________________________________ Dairy ___________________________________________ Vegetable _______________________________________ Fruit ____________________________________________ Meat ___________________________________________ Fat _____________________________________________ DAILY TOTALS Starches _________________________ Vegetable ________________________ Fruit ____________________________ Dairy ____________________________ Meat ____________________________ Fat ______________________________ Document Released: 06/10/2005 Document Revised: 12/06/2011 Document Reviewed: 04/21/2009 [employer] Patient Information 2013 [place], [place].   Hypoglycemia (Low Blood Sugar) Hypoglycemia is when the glucose (sugar) in your blood is too low. Hypoglycemia can happen for many reasons. It can happen to people with or without diabetes. Hypoglycemia can develop quickly and can be a medical emergency.  CAUSES  Having hypoglycemia does not mean that you will develop diabetes. Different causes include:  Missed or delayed meals or not enough carbohydrates eaten.  Medication overdose. This could be by accident or deliberate. If by accident, your medication may need to be adjusted or changed.  Exercise or increased activity without adjustments in carbohydrates or medications.  A nerve disorder that affects body functions like your heart rate, blood pressure and digestion (autonomic  neuropathy).  A condition where the stomach muscles do not function properly (gastroparesis). Therefore, medications may not absorb properly.  The inability to recognize the signs of hypoglycemia (hypoglycemic unawareness).  Absorption of insulin  may be altered.  Alcohol consumption.  Pregnancy/menstrual cycles/postpartum. This may be due to hormones.  Certain kinds of tumors. This is very rare. SYMPTOMS   Sweating.  Hunger.  Dizziness.  Blurred vision.  Drowsiness.  Weakness.  Headache.  Rapid heart beat.  Shakiness.  Nervousness. DIAGNOSIS  Diagnosis is made by monitoring blood glucose in one or all of the following ways:  Fingerstick blood glucose monitoring.  Laboratory results. TREATMENT  If you think your blood glucose is low:  Check your blood glucose, if possible. If it is less than 70 mg/dl, take one of the following:  3-4 glucose tablets.   cup juice (prefer clear like apple).   cup "regular" soda pop.  1 cup milk.  -1 tube of glucose gel.  5-6 hard candies.  Do not over treat because your blood glucose (sugar) will only go too high.  Wait 15 minutes and recheck your blood glucose. If it is still less than 70 mg/dl (or below your target range), repeat treatment.  Eat a snack if it is more than one hour until your next meal. Sometimes, your blood glucose may go so low that you are unable to treat yourself. You may need someone to help you. You may even pass out or be unable to swallow. This may require you to get an injection of glucagon, which raises the blood glucose. HOME CARE INSTRUCTIONS  Check blood glucose as recommended by your caregiver.  Take medication as prescribed by your caregiver.  Follow your meal plan. Do not skip meals. Eat on time.  If you are going to drink alcohol, drink it only with meals.  Check your blood glucose before driving.  Check your blood glucose before and after exercise. If you exercise longer or  different than usual, be sure to check blood glucose more frequently.  Always carry treatment with you. Glucose tablets are the easiest to carry.  Always wear medical alert jewelry or carry some form of identification that states that you have diabetes. This will alert people that you have diabetes. If you have hypoglycemia, they will have a better idea on what to do. SEEK MEDICAL CARE IF:   You are having problems keeping your blood sugar at target range.  You are having frequent episodes of hypoglycemia.  You feel you might be having side effects from your medicines.  You have symptoms of an illness that is not improving after 3-4 days.  You notice a change in vision or a new problem with your vision. SEEK IMMEDIATE MEDICAL CARE IF:   You are a family member or friend of a person whose blood glucose goes below 70 mg/dl and is accompanied by:  Confusion.  A change in mental status.  The inability to swallow.  Passing out. Document Released: 09/13/2005 Document Revised: 12/06/2011 Document Reviewed: 01/10/2012 [facility] Patient Information 2013 [place], [place].

## 2012-11-22 NOTE — Progress Notes (Signed)
  Subjective:    Patient ID: William Mckenzie, male    DOB: [birth year], 47 y.o.   MRN: [id number]  HPI Acute visit Has noted weight loss for the last 2 or 3 weeks, per his scale approximately 14 pounds. He also reports polyuria and polydipsia. No history of previous diabetes.  Past Medical History  Diagnosis Date  . CAD (coronary artery disease)     prior inferior MI in 2010 treated with stenting of the right PDA; had nonobstructive disease in the LAD and LCX with approximately 40% lesions. EF normal  . Hyperlipidemia   . Hypertension     white coat   . Anxiety   . Obesity    Past Surgical History  Procedure Laterality Date  . Knee surgery  12-2010    LEFT, meniscus . arthroscopy      Review of Systems Denies blurred vision or headache No nausea, vomiting, abdominal pain. No chest pain or shortness of breath. Was recently seen with respiratory symptoms, all symptoms resolved. Denies dysuria, difficulty urinating or gross hematuria.     Objective:   Physical Exam General -- alert, well-developed, NAD Neurologic-- alert & oriented X3 and strength normal in all extremities. Psych-- Cognition and judgment appear intact. Alert and cooperative with normal attention span and concentration.  not anxious appearing and not depressed appearing.       Assessment & Plan:  CBG 408 New onset of diabetes, CBG 408. Patient is nontoxic, most likely he has type 2 diabetes and amenable to be treated as an outpatient. Discussed with the patient what diabetes is. We also discussed the A1c concept and CBG goals. Refer to a nutrionist, in the meantime to go back to the diet he practiced after his heart attack.  He exercise for hours a week and that seems to be sufficient for now. Labs Start Amaryl 4 mg on metformin 500 mg BID, plan is to eventually take him off Amaryl. Return to office in 4 weeks.

## 2012-11-22 NOTE — Assessment & Plan Note (Signed)
New onset of diabetes, CBG 408. Patient is nontoxic, most likely he has type 2 diabetes and amenable to be treated as an outpatient. Discussed with the patient what diabetes is. We also discussed the A1c concept and CBG goals. Refer to a nutrionist, in the meantime to go back to the diet he practiced after his heart attack.  He exercise 4 hours a week and that seems to be sufficient for now. Labs Start Amaryl 4 mg on metformin 500 mg BID, plan is to eventually take him off Amaryl. Return to office in 4 weeks. Work excuse x a couple of days

## 2012-11-23 ENCOUNTER — Encounter: Payer: Self-pay | Admitting: *Deleted

## 2012-11-29 ENCOUNTER — Ambulatory Visit (INDEPENDENT_AMBULATORY_CARE_PROVIDER_SITE_OTHER): Payer: 59 | Admitting: Internal Medicine

## 2012-11-29 ENCOUNTER — Encounter: Payer: Self-pay | Admitting: Internal Medicine

## 2012-11-29 ENCOUNTER — Telehealth: Payer: Self-pay | Admitting: *Deleted

## 2012-11-29 VITALS — BP 138/86 | HR 82 | Temp 97.9°F | Wt 249.0 lb

## 2012-11-29 DIAGNOSIS — IMO0001 Reserved for inherently not codable concepts without codable children: Secondary | ICD-10-CM

## 2012-11-29 LAB — GLUCOSE, POCT (MANUAL RESULT ENTRY): POC Glucose: 110 mg/dl — AB (ref 70–99)

## 2012-11-29 NOTE — Patient Instructions (Addendum)
Take meds per med list below  Call if vision not better in 10-14 days

## 2012-11-29 NOTE — Assessment & Plan Note (Signed)
Patient seen today urgently, he is concerned about blurred vision, likely his vision  will correct within 2 weeks once his blood sugar improves. He CBGs are great, will decrease Amaryl from 4 mg to 2 mg. Increase metformin from 500 twice a day to 750 mg twice a day. Followup as recommended next month

## 2012-11-29 NOTE — Telephone Encounter (Signed)
Patient came to office with complaints of not being able to see. Patient newly diagnosed with DM with CBG over 400, Dr. Drue Novel to see patient today.

## 2012-11-29 NOTE — Progress Notes (Signed)
  Subjective:    Patient ID: ARJAN STROHM, male    DOB: 09-25-1966, 47 y.o.   MRN: [id number]  HPI Seen urgently today, he mentioned he "couldn't see". On further questioning, his vision has been blurred for at least the last 2 weeks. Denies any specific  diplopia or amaurosis fugax. Since he was here, we reviewed his medications, good compliance. CBGs have decreased gradually from the 400s to the 200s; yesterday blood sugar was 89 in the morning, 91 in the afternoon ; he did feel slightly shaky when the blood sugar was 89.  Past Medical History  Diagnosis Date  . CAD (coronary artery disease)     prior inferior MI in 2010 treated with stenting of the right PDA; had nonobstructive disease in the LAD and LCX with approximately 40% lesions. EF normal  . Hyperlipidemia   . Hypertension     white coat   . Anxiety   . Obesity    Past Surgical History  Procedure Laterality Date  . Knee surgery  12-2010    LEFT, meniscus . arthroscopy    Review of Systems     Objective:   Physical Exam  General -- alert oriented x3, no apparent distress, vital signs are stable Eyes:EOMI, PERRLA, ant chamber normal, undilated funduscopy normal Neurologic-- alert & oriented X3 and strength normal in all extremities. Psych-- Cognition and judgment appear intact. Alert and cooperative with normal attention span and concentration.  not anxious appearing and not depressed appearing.       Assessment & Plan:

## 2012-12-12 ENCOUNTER — Ambulatory Visit: Payer: 59 | Admitting: Cardiovascular Disease

## 2012-12-19 ENCOUNTER — Encounter: Payer: 59 | Attending: Internal Medicine | Admitting: *Deleted

## 2012-12-19 VITALS — Ht 72.5 in | Wt 244.9 lb

## 2012-12-19 DIAGNOSIS — IMO0001 Reserved for inherently not codable concepts without codable children: Secondary | ICD-10-CM

## 2012-12-19 DIAGNOSIS — Z713 Dietary counseling and surveillance: Secondary | ICD-10-CM | POA: Insufficient documentation

## 2012-12-19 DIAGNOSIS — E119 Type 2 diabetes mellitus without complications: Secondary | ICD-10-CM | POA: Insufficient documentation

## 2012-12-24 ENCOUNTER — Encounter: Payer: Self-pay | Admitting: *Deleted

## 2012-12-24 NOTE — Progress Notes (Signed)
Patient was seen on 12/19/2012 for the first of a series of three diabetes self-management courses at [facility]. Patient's most recent A1c was 12.2 % on 11/22/2012 The following learning objectives were met by the patient during this course:   Defines the role of glucose and insulin  Identifies type of diabetes and pathophysiology  Defines the diagnostic criteria for diabetes and prediabetes  States the risk factors for Type 2 Diabetes  States the symptoms of Type 2 Diabetes  Defines Type 2 Diabetes treatment goals  Defines Type 2 Diabetes treatment options  States the rationale for glucose monitoring  Identifies A1C, glucose targets, and testing times  Identifies proper sharps disposal  Defines the purpose of a diabetes food plan  Identifies carbohydrate food groups  Defines effects of carbohydrate foods on glucose levels  Identifies carbohydrate choices/grams/food labels  States benefits of physical activity and effect on glucose  Review of suggested activity guidelines  Handouts given during class include:  Type 2 Diabetes: Basics Book  My Food Plan Book  Food and Activity Log   Follow-Up Plan: Core Class 2

## 2012-12-31 ENCOUNTER — Other Ambulatory Visit: Payer: Self-pay | Admitting: Nurse Practitioner

## 2013-01-02 ENCOUNTER — Encounter: Payer: 59 | Admitting: Internal Medicine

## 2013-01-08 ENCOUNTER — Encounter: Payer: Self-pay | Admitting: Cardiovascular Disease

## 2013-01-08 ENCOUNTER — Ambulatory Visit (INDEPENDENT_AMBULATORY_CARE_PROVIDER_SITE_OTHER): Payer: 59 | Admitting: Cardiovascular Disease

## 2013-01-08 VITALS — BP 142/61 | HR 66 | Ht 72.0 in | Wt 238.0 lb

## 2013-01-08 DIAGNOSIS — I2510 Atherosclerotic heart disease of native coronary artery without angina pectoris: Secondary | ICD-10-CM

## 2013-01-08 MED ORDER — ROSUVASTATIN CALCIUM 20 MG PO TABS: 20.0000 mg | ORAL_TABLET | Freq: Every day | ORAL | Status: DC

## 2013-01-08 MED ORDER — NITROGLYCERIN 0.4 MG SL SUBL: 0.4000 mg | SUBLINGUAL_TABLET | SUBLINGUAL | Status: DC | PRN

## 2013-01-08 MED ORDER — METOPROLOL SUCCINATE ER 25 MG PO TB24: ORAL_TABLET | ORAL | Status: DC

## 2013-01-08 NOTE — Patient Instructions (Addendum)
Your physician has recommended you make the following change in your medication: DECREASE Aspirin to 81mg  once a day  Your physician wants you to follow-up in: 1 YEAR with Dr Excell Seltzer.  You will receive a reminder letter in the mail two months in advance. If you don't receive a letter, please call our office to schedule the follow-up appointment.

## 2013-01-08 NOTE — Progress Notes (Signed)
HPI:  14 her old gentleman presenting for followup evaluation. The patient has CAD and initially presented with an inferior wall MI in 2010. He was treated with stenting of the PDA branch of the right coronary artery with a DES. He took dual antiplatelet therapy for one year and has remained on aspirin. He had nonobstructive disease of the left coronary artery and preserved left ventricular function. He was last seen in December 2012. Last lipids from 2013 showed a cholesterol of 144, triglycerides 128, HDL 48, and LDL 71.  The patient was diagnosed with diabetes recently. He has made efforts at weight loss and exercise since that time. He was started on metformin and Amaryl. He has lost about 30 pounds in the last 6 weeks. He feels much better and notes that his blood glucoses have decreased from around 400 to a range between 95 and 120.  He denies chest pain, chest pressure, dyspnea, edema, or palpitations. He has no complaints today.  Outpatient Encounter Prescriptions as of 01/08/2013  Medication Sig Dispense Refill  . aspirin 325 MG tablet Take 325 mg by mouth daily.        Marland Kitchen buPROPion (WELLBUTRIN SR) 150 MG 12 hr tablet Take 1 tablet (150 mg total) by mouth 2 (two) times daily.  180 tablet  0  . glimepiride (AMARYL) 4 MG tablet Take 2 mg by mouth daily before breakfast.      . glucose blood (ONE TOUCH ULTRA TEST) test strip Use as instructed  100 each  12  . metFORMIN (GLUCOPHAGE) 500 MG tablet Take 500 mg by mouth 2 (two) times daily with a meal.       . metoprolol succinate (TOPROL-XL) 25 MG 24 hr tablet TAKE 1 TABLET (25 MG TOTAL) BY MOUTH DAILY.  90 tablet  2  . nitroGLYCERIN (NITROSTAT) 0.4 MG SL tablet Place 1 tablet (0.4 mg total) under the tongue every 5 (five) minutes as needed.  25 tablet  2  . ONE TOUCH LANCETS MISC Check once daily.  200 each  3  . rosuvastatin (CRESTOR) 20 MG tablet Take 1 tablet (20 mg total) by mouth daily.  90 tablet  3   No facility-administered encounter  medications on file as of 01/08/2013.    No Known Allergies  Past Medical History  Diagnosis Date  . CAD (coronary artery disease)     prior inferior MI in 2010 treated with stenting of the right PDA; had nonobstructive disease in the LAD and LCX with approximately 40% lesions. EF normal  . Hyperlipidemia   . Hypertension     white coat   . Anxiety   . Obesity   . Diabetes mellitus without complication    ROS: Had blurry vision associated with hyperglycemia, but much improved now. Otherwise negative except as per HPI  BP 142/61  Pulse 66  Ht 6' (1.829 m)  Wt 107.956 kg (238 lb)  BMI 32.27 kg/m2  PHYSICAL EXAM: Pt is alert and oriented, NAD HEENT: normal Neck: JVP - normal, carotids 2+= without bruits Lungs: CTA bilaterally CV: RRR without murmur or gallop Abd: soft, NT, Positive BS, no hepatomegaly Ext: no C/C/E, distal pulses intact and equal Skin: warm/dry no rash  EKG:  Sinus bradycardia 55 beats per minute, within normal limits.  ASSESSMENT AND PLAN: 1. CAD, native vessel. Medications reviewed. Recommended reduction and aspirin dose from 325-81 mg daily. Otherwise he should continue on his current program. He has no anginal symptoms and also has normal LV function.  Would be reasonable to consider an ACE inhibitor in the setting of newly diagnosed type 2 diabetes. However, he is on a course of fairly rapid weight loss and I suspect his blood pressure will continue to drop with this. Could be considered when he follows up with Dr. Drue Novel, depending on blood pressure.  2. Hyperlipidemia. He remains on Crestor 20 mg daily. Diet and exercise as above.  3. Newly diagnosed type 2 diabetes. Glycemic control greatly improved on current medical program with weight loss.  For followup I will see him back in 12 months. We discussed specific weight loss goals and the importance of glycemic control, weight loss, and low starch/carb diet.  Tonny Bollman 01/08/2013 5:18 PM

## 2013-01-09 ENCOUNTER — Encounter: Payer: 59 | Attending: Internal Medicine | Admitting: *Deleted

## 2013-01-09 DIAGNOSIS — IMO0001 Reserved for inherently not codable concepts without codable children: Secondary | ICD-10-CM

## 2013-01-09 DIAGNOSIS — Z713 Dietary counseling and surveillance: Secondary | ICD-10-CM | POA: Insufficient documentation

## 2013-01-09 DIAGNOSIS — E119 Type 2 diabetes mellitus without complications: Secondary | ICD-10-CM | POA: Insufficient documentation

## 2013-01-10 NOTE — Progress Notes (Signed)
  Patient was seen on 01/09/13 for the second of a series of three diabetes self-management courses at the [facility]. The following learning objectives were met by the patient during this course:   Explain basic nutrition maintenance and quality assurance  Describe causes, symptoms and treatment of hypoglycemia and hyperglycemia  Explain how to manage diabetes during illness  Describe the importance of good nutrition for health and healthy eating strategies  List strategies to follow meal plan when dining out  Describe the effects of alcohol on glucose and how to use it safely  Describe problem solving skills for day-to-day glucose challenges  Describe strategies to use when treatment plan needs to change  Identify important factors involved in successful weight loss  Describe ways to remain physically active  Describe the impact of regular activity on insulin resistance    Handouts given in class:  Refrigerator magnet for Sick Day Guidelines  [facility] Oral medication/insulin handout  Follow-Up Plan: Patient will attend the final class of the ADA Diabetes Self-Care Education.    

## 2013-01-19 ENCOUNTER — Other Ambulatory Visit: Payer: Self-pay | Admitting: Internal Medicine

## 2013-01-19 NOTE — Telephone Encounter (Signed)
Refill done.  

## 2013-01-20 ENCOUNTER — Telehealth: Payer: Self-pay | Admitting: Internal Medicine

## 2013-01-20 NOTE — Telephone Encounter (Signed)
Call pt, ask about CBG readings, let me know

## 2013-01-22 NOTE — Telephone Encounter (Signed)
lmovm for pt to return call.  

## 2013-01-23 ENCOUNTER — Encounter: Payer: 59 | Admitting: Dietician

## 2013-01-23 DIAGNOSIS — IMO0001 Reserved for inherently not codable concepts without codable children: Secondary | ICD-10-CM

## 2013-01-26 NOTE — Telephone Encounter (Signed)
lmovm for pt to return call.  

## 2013-01-31 NOTE — Progress Notes (Signed)
  Patient was seen on 01/23/2013 for the third of a series of three diabetes self-management courses at the [facility]. The following learning objectives were met by the patient during this course:  Ht: 72.5 in  WT: 244.9 lb  A1C (11/22/2012) = 12.2%    Describe how diabetes changes over time   Identify diabetes complications and ways to prevent them   Describe strategies that can promote heart health including lowering blood pressure and cholesterol   Describe strategies to lower dietary fat and sodium in the diet   Identify physical activities that benefit cardiovascular health   Evaluate success in meeting personal goal   Describe the belief that they can live successfully with diabetes day to day   Establish 2-3 goals that they will plan to diligently work on until they return for the free 12-month follow-up visit  The following handouts were given in class:  3 Month Follow Up Visit handout  Goal setting handout  Class evaluation form  Your patient has established the following 3 month goals for diabetes self-care:  Count Carbs until reach 210 lb and then go to 30 gm carb per meal.  Be active 60 minutes 60 minutes 4 times per week.  Count carbohydrates at most of my meals and snacks.  Follow-Up Plan: Patient will attend a 3 month follow-up visit for diabetes self-management education.

## 2013-02-11 ENCOUNTER — Other Ambulatory Visit: Payer: Self-pay | Admitting: Internal Medicine

## 2013-02-12 NOTE — Telephone Encounter (Signed)
Refill done.  

## 2013-03-06 ENCOUNTER — Ambulatory Visit (INDEPENDENT_AMBULATORY_CARE_PROVIDER_SITE_OTHER): Payer: 59 | Admitting: Internal Medicine

## 2013-03-06 ENCOUNTER — Encounter: Payer: Self-pay | Admitting: Internal Medicine

## 2013-03-06 VITALS — BP 116/82 | HR 65 | Temp 98.3°F | Ht 72.0 in | Wt 229.0 lb

## 2013-03-06 DIAGNOSIS — G4730 Sleep apnea, unspecified: Secondary | ICD-10-CM

## 2013-03-06 DIAGNOSIS — F419 Anxiety disorder, unspecified: Secondary | ICD-10-CM

## 2013-03-06 DIAGNOSIS — E785 Hyperlipidemia, unspecified: Secondary | ICD-10-CM

## 2013-03-06 DIAGNOSIS — IMO0001 Reserved for inherently not codable concepts without codable children: Secondary | ICD-10-CM

## 2013-03-06 DIAGNOSIS — Z0000 Encounter for general adult medical examination without abnormal findings: Secondary | ICD-10-CM | POA: Insufficient documentation

## 2013-03-06 LAB — COMPREHENSIVE METABOLIC PANEL
ALT: 31 U/L (ref 0–53)
AST: 26 U/L (ref 0–37)
Chloride: 108 mEq/L (ref 96–112)
Creatinine, Ser: 1.2 mg/dL (ref 0.4–1.5)
Sodium: 142 mEq/L (ref 135–145)
Total Bilirubin: 0.5 mg/dL (ref 0.3–1.2)
Total Protein: 7.1 g/dL (ref 6.0–8.3)

## 2013-03-06 NOTE — Assessment & Plan Note (Addendum)
Tdap 2009 Never had a cscope No family history of   colon cancer or prostate cancer Lifestyle is excellent. Labs

## 2013-03-06 NOTE — Progress Notes (Signed)
  Subjective:    Patient ID: William Mckenzie, male    DOB: 10-10-1965, 47 y.o.   MRN: [id number]  HPI Complete physical exam  Past Medical History  Diagnosis Date  . CAD (coronary artery disease)     prior inferior MI in 2010 treated with stenting of the right PDA; had nonobstructive disease in the LAD and LCX with approximately 40% lesions. EF normal  . Hyperlipidemia   . Hypertension     white coat   . Anxiety   . Obesity   . Diabetes mellitus without complication    Past Surgical History  Procedure Laterality Date  . Knee surgery  12-2010    LEFT, meniscus . arthroscopy   History   Social History  . Marital Status: Married    Spouse Name: N/A    Number of Children: 1  . Years of Education: N/A   Occupational History  . former [employer]   . CUSTOMER SERVICE [employer]   Social History Main Topics  . Smoking status: Never Smoker   . Smokeless tobacco: Former Neurosurgeon     Comment: quit chewing tobacco 2010  . Alcohol Use: Yes     Comment: socially   . Drug Use: No  . Sexually Active: Not on file   Other Topics Concern  . Not on file   Social History Narrative                Family History  Problem Relation Age of Onset  . CAD Other     GGF GF   . Colon cancer Neg Hx   . Prostate cancer Neg Hx   . Diabetes Neg Hx     Review of Systems Doing very well. Diet is much healthier, very active, has dropped almost 30 pounds since the beginning of the year. Denies chest pain or shortness or breath No nausea, vomiting, diarrhea. No blood in the stools. No dysuria or gross hematuria.     Objective:   Physical Exam General -- alert, well-developed,NAD .   Neck --no thyromegaly , normal carotid pulse Lungs -- normal respiratory effort, no intercostal retractions, no accessory muscle use, and normal breath sounds.   Heart-- normal rate, regular rhythm, no murmur, and no gallop.   Abdomen--soft, non-tender, no distention, no masses, no HSM, no guarding, and no  rigidity.   Extremities-- no pretibial edema bilaterally Neurologic-- alert & oriented X3 and strength normal in all extremities. Psych-- Cognition and judgment appear intact. Alert and cooperative with normal attention span and concentration.  not anxious appearing and not depressed appearing.      Assessment & Plan:

## 2013-03-06 NOTE — Assessment & Plan Note (Signed)
Dx w/ OSA ~ 2008, was Rx a CPAP but used x few months. Reports snoring always goes away w/ wt loss. Currently denies snoring

## 2013-03-06 NOTE — Patient Instructions (Addendum)
Next visit in 4 months  

## 2013-03-06 NOTE — Assessment & Plan Note (Signed)
Under excellent control, no change 

## 2013-03-06 NOTE — Assessment & Plan Note (Signed)
Start Wellbutrin for quitting tobacco, despite not having depression he feels better on Wellbutrin. Plan: Continue Wellbutrin

## 2013-03-06 NOTE — Assessment & Plan Note (Signed)
Diagnosed with diabetes with A1c of 12 10-2012. Since then, has significantly improved his lifestyle, is losing weight. Currently on Amaryl 2 mg plus metformin 500 mg twice a day. CBGs are around 100 +/ -10. Few times his CBG has dropped to 80 with symptoms. Plan:  Check A1c. Depending on results consider decrease amaryl to 1 mg daily and increase metformin. BP is excellent we'll consider  Losartan at some point. Check a microalbumin.

## 2013-03-07 ENCOUNTER — Encounter: Payer: Self-pay | Admitting: Internal Medicine

## 2013-03-07 LAB — MICROALBUMIN / CREATININE URINE RATIO
Creatinine,U: 173.3 mg/dL
Microalb Creat Ratio: 0.3 mg/g (ref 0.0–30.0)

## 2013-03-16 ENCOUNTER — Telehealth: Payer: Self-pay | Admitting: *Deleted

## 2013-03-16 ENCOUNTER — Encounter: Payer: Self-pay | Admitting: *Deleted

## 2013-03-16 MED ORDER — METFORMIN HCL 1000 MG PO TABS: 1000.0000 mg | ORAL_TABLET | Freq: Two times a day (BID) | ORAL | Status: DC

## 2013-03-16 NOTE — Telephone Encounter (Signed)
Message copied by Nada Maclachlan on Fri Mar 16, 2013 11:49 AM ------      Message from: Wanda Plump      Created: Sat Mar 10, 2013  5:09 PM       Advise patient:      Outstanding response to treatment, his A1c has decreased to 6.0!       He needs to be very proud of his achievement.      Plan:      Increase metformin to 1000 mg 1 by mouth twice a day, send a new prescription; to prevent GI side effects may start with metformin 1000 mg one tablet in the morning and half at night for one week then 1 po bid.      Stop Amaryl             ------

## 2013-03-16 NOTE — Telephone Encounter (Signed)
Unable to get in touch with pt, mailed letter, sent rx to pharmacy.

## 2013-04-02 ENCOUNTER — Telehealth: Payer: Self-pay | Admitting: Internal Medicine

## 2013-04-02 NOTE — Telephone Encounter (Signed)
We recently discontinued Amaryl and increase metformin, are his blood sugars still very good? If the blood sugars are normal, then please schedule an appointment within few days,  urgent appointment if he having fever, chills, blood in the urine or difficulty urinating.

## 2013-04-02 NOTE — Telephone Encounter (Signed)
Left message to return call 

## 2013-04-02 NOTE — Telephone Encounter (Signed)
Please advise 

## 2013-04-02 NOTE — Telephone Encounter (Signed)
Pt came in today wanted to know what should he do about him going to the bathroom constantly around 15 times a day. Also patient states that last time he was seen for this is blood sugar was high but in this case his blood sugar is fine. Pt wanted to know should he be seen by a doctor or he is he okay because his blood sugar is okay. Please inform patient what is his next best step. thanks

## 2013-04-02 NOTE — Telephone Encounter (Signed)
Patient returning call from [place] in office.  Per Epic, Dr. Drue Novel writes, "We recently discontinued Amaryl and increase metformin, are his blood sugars still very good?  If the blood sugars are normal, then please schedule an appointment within few days,  urgent appointment if he having fever, chills, blood in the urine or difficulty urinating."  Patient states his sugars are 65 on arising and 90 later in the day.  Not voiding "nearly as much as I had been," but over past few days has been to the bathroom to void more often, up to 15 times per day.  Denies fever, chills, bloody urine, or difficulty voiding.  Per office instructions, appt scheduled 04/03/13 1130 with Dr. Drue Novel.  William Mckenzie

## 2013-04-03 ENCOUNTER — Ambulatory Visit (INDEPENDENT_AMBULATORY_CARE_PROVIDER_SITE_OTHER): Payer: 59 | Admitting: Internal Medicine

## 2013-04-03 ENCOUNTER — Encounter: Payer: Self-pay | Admitting: Internal Medicine

## 2013-04-03 VITALS — BP 112/80 | HR 61 | Temp 98.3°F | Wt 226.0 lb

## 2013-04-03 DIAGNOSIS — R350 Frequency of micturition: Secondary | ICD-10-CM | POA: Insufficient documentation

## 2013-04-03 DIAGNOSIS — IMO0001 Reserved for inherently not codable concepts without codable children: Secondary | ICD-10-CM

## 2013-04-03 MED ORDER — METFORMIN HCL 850 MG PO TABS: 850.0000 mg | ORAL_TABLET | Freq: Two times a day (BID) | ORAL | Status: DC

## 2013-04-03 NOTE — Assessment & Plan Note (Addendum)
New problem Urinary frequency for a few days, better today.CBGs are not elevated. Prostate exam is essentially normal. Etiology unclear but seems better today. Plan:  UA, urine culture, treat if appropriate. Consider  empiric antibiotic treatment, prostatitis?

## 2013-04-03 NOTE — Assessment & Plan Note (Addendum)
Diabetes is under excellent control. The patient currently takes metformin 500 mg twice a day and Amaryl 4 mg half tablet daily. CBGs in the morning in the 60s, in the afternoon in the 90s. Plan: Increase metformin to  850 mg twice a day, discontinue amaryl

## 2013-04-03 NOTE — Patient Instructions (Addendum)
Stop glimeperide Take metformin 850 mg twice a day Office visit in 3 months. If he urinary symptoms continue, please let me know.

## 2013-04-03 NOTE — Progress Notes (Signed)
  Subjective:    Patient ID: William Mckenzie, male    DOB: [birth year], 47 y.o.   MRN: [id number]  HPI Acute visit Developed urinary frequency for the last 5 days, he urinated approximately 15 times a day. Today he only needed to urinate once consequently I think is getting better. As far as the diabetes, his last A1c was 6.0, my intention was to discontinue Amaryl but he didn't. See assessment and plan. He continue taking his blood sugars regularly, in the morning then the 60s, in the afternoon then the 90s.  Past Medical History  Diagnosis Date  . CAD (coronary artery disease)     prior inferior MI in 2010 treated with stenting of the right PDA; had nonobstructive disease in the LAD and LCX with approximately 40% lesions. EF normal  . Hyperlipidemia   . Hypertension     white coat   . Anxiety   . Obesity   . Diabetes mellitus without complication    Past Surgical History  Procedure Laterality Date  . Knee surgery  12-2010    LEFT, meniscus . arthroscopy    Review of Systems No fever chills No nausea, vomiting, diarrhea. No dysuria, blood in the urine or difficulty urinating. No history of previous UTIs.     Objective:   Physical Exam BP 112/80  Pulse 61  Temp(Src) 98.3 F (36.8 C) (Oral)  Wt 226 lb (102.513 kg)  BMI 30.64 kg/m2  SpO2 97%  General -- alert, well-developed, NAD.    Abdomen--soft, non-tender, no distention, no masses, no HSM, no guarding, and no rigidity.  No CVA tenderness Extremities-- no pretibial edema bilaterally Rectal-- No external abnormalities noted. Normal sphincter tone. No rectal masses or tenderness. no stool Found. Prostate:  Prostate gland firm and smooth, no enlargement, nodularity, tenderness, mass, asymmetry or induration. Psych-- Cognition and judgment appear intact. Alert and cooperative with normal attention span and concentration.  not anxious appearing and not depressed appearing.      Assessment & Plan:

## 2013-04-04 LAB — URINALYSIS
Hgb urine dipstick: NEGATIVE
Leukocytes, UA: NEGATIVE
Nitrite: NEGATIVE
Protein, ur: NEGATIVE mg/dL
Urobilinogen, UA: 0.2 mg/dL (ref 0.0–1.0)

## 2013-04-04 LAB — URINE CULTURE: Colony Count: 4000

## 2013-04-06 ENCOUNTER — Encounter: Payer: Self-pay | Admitting: *Deleted

## 2013-04-30 ENCOUNTER — Telehealth: Payer: Self-pay

## 2013-04-30 NOTE — Telephone Encounter (Signed)
Patient came into the lobby to request his refills due to the pharmacy stating that they did not have his Rx. Called pharmacy and they have it on file. Advised to refill. Patient notified in lobby.

## 2013-05-07 ENCOUNTER — Encounter: Payer: Self-pay | Admitting: *Deleted

## 2013-05-07 ENCOUNTER — Encounter: Payer: 59 | Attending: Internal Medicine | Admitting: *Deleted

## 2013-05-07 VITALS — Ht 72.5 in | Wt 228.9 lb

## 2013-05-07 DIAGNOSIS — E119 Type 2 diabetes mellitus without complications: Secondary | ICD-10-CM | POA: Insufficient documentation

## 2013-05-07 DIAGNOSIS — Z713 Dietary counseling and surveillance: Secondary | ICD-10-CM | POA: Insufficient documentation

## 2013-05-07 DIAGNOSIS — IMO0001 Reserved for inherently not codable concepts without codable children: Secondary | ICD-10-CM

## 2013-05-07 NOTE — Progress Notes (Signed)
  Patient was seen on 05/07/2013 for their 3 month follow-up as a part of the diabetes self-management courses at the [facility]. The following learning objectives were met by your patient during this course:  Patient self reports the following:  Diabetes control has improved since diabetes self-management training: YES Number of days blood glucose is >200: NONE Last MD appointment for diabetes: June, 2014 Changes in treatment plan: MD DECREASED ORAL MEDS TO JUST METFORMN Confidence with ability to manage diabetes: GOOD Areas for improvement with diabetes self-care: DOING GREAT NOW Willingness to participate in diabetes support group: NOT AT THIS TIME  CURRENT A1C ON 03/10/2013 = 6.0%  Please see Diabetes Flow sheet for findings related to patient's self-care.  Your patient has established the following 3 month goals for diabetes self-care:  Count Carbs until reach 210 lb and then go to 30 gm carb per meal. = Eating about 15 to 30 grams per meal Be active 60 minutes 4 times per week. YES Count carbohydrates at most of my meals and snacks.YES  Follow-Up Plan: Patient is eligible for a "free" 30 minute diabetes self-care appointment in the next year. Patient to call and schedule as needed.

## 2013-07-04 ENCOUNTER — Encounter: Payer: Self-pay | Admitting: Internal Medicine

## 2013-07-04 ENCOUNTER — Ambulatory Visit (INDEPENDENT_AMBULATORY_CARE_PROVIDER_SITE_OTHER): Payer: 59 | Admitting: Internal Medicine

## 2013-07-04 VITALS — BP 110/71 | HR 83 | Temp 98.6°F | Ht 72.5 in | Wt 228.0 lb

## 2013-07-04 DIAGNOSIS — F419 Anxiety disorder, unspecified: Secondary | ICD-10-CM

## 2013-07-04 DIAGNOSIS — E785 Hyperlipidemia, unspecified: Secondary | ICD-10-CM

## 2013-07-04 DIAGNOSIS — R350 Frequency of micturition: Secondary | ICD-10-CM

## 2013-07-04 DIAGNOSIS — Z23 Encounter for immunization: Secondary | ICD-10-CM

## 2013-07-04 DIAGNOSIS — IMO0001 Reserved for inherently not codable concepts without codable children: Secondary | ICD-10-CM

## 2013-07-04 DIAGNOSIS — I2510 Atherosclerotic heart disease of native coronary artery without angina pectoris: Secondary | ICD-10-CM

## 2013-07-04 DIAGNOSIS — F411 Generalized anxiety disorder: Secondary | ICD-10-CM

## 2013-07-04 MED ORDER — METOPROLOL SUCCINATE ER 25 MG PO TB24: ORAL_TABLET | ORAL | Status: DC

## 2013-07-04 NOTE — Assessment & Plan Note (Signed)
Labs

## 2013-07-04 NOTE — Assessment & Plan Note (Signed)
States is doing great w/  wellbutrin

## 2013-07-04 NOTE — Progress Notes (Signed)
  Subjective:    Patient ID: William Mckenzie, male    DOB: [birth year], 47 y.o.   MRN: [id number]  HPI Routine office visit.  Had urinary frequency, symptoms resolve. CAD--good compliance with medications, needs a refill metoprolol, denies chest pain. Diabetes--good medication compliance, CBGs around 100, denies lower extremity paresthesias Hyperlipidemia, taking Crestor regularly, last FLP very good.  Past Medical History  Diagnosis Date  . CAD (coronary artery disease)     prior inferior MI in 2010 treated with stenting of the right PDA; had nonobstructive disease in the LAD and LCX with approximately 40% lesions. EF normal  . Hyperlipidemia   . Hypertension     white coat   . Anxiety   . Obesity   . Diabetes mellitus without complication    Past Surgical History  Procedure Laterality Date  . Knee surgery  12-2010    LEFT, meniscus . arthroscopy   History   Social History  . Marital Status: Married    Spouse Name: N/A    Number of Children: 1  . Years of Education: N/A   Occupational History  . former [employer]   . CUSTOMER SERVICE [employer]   Social History Main Topics  . Smoking status: Never Smoker   . Smokeless tobacco: Former Neurosurgeon     Comment: quit chewing tobacco 2010  . Alcohol Use: Yes     Comment: socially   . Drug Use: No  . Sexual Activity: Not on file   Other Topics Concern  . Not on file   Social History Narrative                 Review of Systems Diet-- healthy Exercise-- usually very good , martial arts x 4/week No  CP, SOB, lower extremity edema Denies  nausea, vomiting diarrhea Denies  blood in the stools No anxiety, depression, sx well controlled  Denies suicidal ideas       Objective:   Physical Exam BP 110/71  Pulse 83  Temp(Src) 98.6 F (37 C)  Ht 6' 0.5" (1.842 m)  Wt 228 lb (103.42 kg)  BMI 30.48 kg/m2  SpO2 99% General -- alert, well-developed, NAD.  Lungs -- normal respiratory effort, no intercostal retractions,  no accessory muscle use, and normal breath sounds.  Heart-- normal rate, regular rhythm, no murmur.  DIABETIC FEET EXAM: No lower extremity edema Normal pedal pulses bilaterally Skin normal, nails normal, no calluses Pinprick examination of the feet normal. Neurologic--  alert & oriented X3. Speech normal, gait normal, strength normal in all extremities.   Psych-- Cognition and judgment appear intact. Cooperative with normal attention span and concentration. No anxious appearing , no depressed appearing.       Assessment & Plan:

## 2013-07-04 NOTE — Patient Instructions (Signed)
Get your blood work before you leave  Next visit in 4-5 months, fasting  for a check up

## 2013-07-04 NOTE — Assessment & Plan Note (Addendum)
asx, continue controlling risk factors RF BBs

## 2013-07-04 NOTE — Assessment & Plan Note (Signed)
resolved 

## 2013-07-04 NOTE — Assessment & Plan Note (Addendum)
Controlled, no fasting today, recheck on return to the office

## 2013-07-06 ENCOUNTER — Encounter: Payer: Self-pay | Admitting: *Deleted

## 2013-08-20 ENCOUNTER — Telehealth: Payer: Self-pay | Admitting: *Deleted

## 2013-08-20 MED ORDER — BUPROPION HCL ER (SR) 150 MG PO TB12: ORAL_TABLET | ORAL | Status: DC

## 2013-08-20 NOTE — Telephone Encounter (Signed)
08/20/2013  Pt came by office today, requesting refill on buPROPion (WELLBUTRIN SR) 150 MG 12 hr tablet.  He is out, took half a dose yesterday and half today until he could get refilled.    Last OV: 07/04/2013  Please send refill to [facility].  Thanks!  bw

## 2013-08-20 NOTE — Telephone Encounter (Signed)
rx refilled per protocol. DJR  

## 2013-10-29 ENCOUNTER — Telehealth: Payer: Self-pay

## 2013-10-30 NOTE — Telephone Encounter (Signed)
Atorvastatin 40 mg daily. Thanks.

## 2013-10-30 NOTE — Telephone Encounter (Signed)
4 weeks crestor 20 mg given to patient.

## 2013-10-31 ENCOUNTER — Other Ambulatory Visit: Payer: Self-pay

## 2013-10-31 MED ORDER — ATORVASTATIN CALCIUM 40 MG PO TABS: 40.0000 mg | ORAL_TABLET | Freq: Every day | ORAL | Status: DC

## 2013-11-06 ENCOUNTER — Ambulatory Visit: Payer: 59 | Admitting: Internal Medicine

## 2013-11-06 DIAGNOSIS — Z0289 Encounter for other administrative examinations: Secondary | ICD-10-CM

## 2013-11-07 ENCOUNTER — Encounter: Payer: Self-pay | Admitting: *Deleted

## 2013-11-07 ENCOUNTER — Encounter: Payer: 59 | Attending: Internal Medicine | Admitting: *Deleted

## 2013-11-07 ENCOUNTER — Ambulatory Visit: Payer: 59 | Admitting: *Deleted

## 2013-11-07 VITALS — Ht 73.0 in | Wt 238.2 lb

## 2013-11-07 DIAGNOSIS — E119 Type 2 diabetes mellitus without complications: Secondary | ICD-10-CM | POA: Insufficient documentation

## 2013-11-07 DIAGNOSIS — Z713 Dietary counseling and surveillance: Secondary | ICD-10-CM | POA: Insufficient documentation

## 2013-11-07 DIAGNOSIS — E1165 Type 2 diabetes mellitus with hyperglycemia: Secondary | ICD-10-CM

## 2013-11-07 DIAGNOSIS — IMO0001 Reserved for inherently not codable concepts without codable children: Secondary | ICD-10-CM

## 2013-11-07 NOTE — Progress Notes (Signed)
  Patient was seen on 11/07/13 for their 3 month follow-up as a part of the diabetes self-management courses at the [facility].   Patient self reports the following:  Dietary intake is less than 35g of carbs per meal Exercise: Trains 4-5 times per week in Martial [employer] Glucose: Performs random tests 1-2 times per week with readings 90-100mg /dl A1c: below 6.0% from initial 13.7% 10/2012  Diabetes control has improved since diabetes self-management training: Yes Number of days blood glucose is >200: None  Patient tests random glucose 1-2 times per week with readings between 90-100mg /dl Changes in treatment plan: None Confidence with ability to manage diabetes: High Areas for improvement with diabetes self-care: None Willingness to participate in diabetes support group: Not at this time  Follow-Up Plan: Patient to call and schedule as needed.

## 2013-11-21 ENCOUNTER — Other Ambulatory Visit: Payer: Self-pay | Admitting: Internal Medicine

## 2013-11-29 ENCOUNTER — Other Ambulatory Visit: Payer: Self-pay | Admitting: Internal Medicine

## 2013-11-30 ENCOUNTER — Other Ambulatory Visit: Payer: Self-pay

## 2014-01-01 ENCOUNTER — Other Ambulatory Visit: Payer: Self-pay | Admitting: *Deleted

## 2014-01-01 MED ORDER — METFORMIN HCL 850 MG PO TABS: ORAL_TABLET | ORAL | Status: DC

## 2014-01-01 NOTE — Addendum Note (Signed)
Addended by: Peggyann Shoals on: 01/01/2014 04:54 PM   Modules accepted: Orders

## 2014-01-09 ENCOUNTER — Ambulatory Visit (INDEPENDENT_AMBULATORY_CARE_PROVIDER_SITE_OTHER): Payer: 59 | Admitting: Cardiovascular Disease

## 2014-01-09 ENCOUNTER — Ambulatory Visit (INDEPENDENT_AMBULATORY_CARE_PROVIDER_SITE_OTHER): Payer: 59 | Admitting: Internal Medicine

## 2014-01-09 ENCOUNTER — Encounter: Payer: Self-pay | Admitting: Internal Medicine

## 2014-01-09 ENCOUNTER — Encounter: Payer: Self-pay | Admitting: Cardiovascular Disease

## 2014-01-09 VITALS — BP 129/74 | HR 75 | Ht 73.0 in | Wt 239.0 lb

## 2014-01-09 VITALS — BP 132/76 | HR 66 | Temp 98.0°F | Wt 237.0 lb

## 2014-01-09 DIAGNOSIS — I2510 Atherosclerotic heart disease of native coronary artery without angina pectoris: Secondary | ICD-10-CM

## 2014-01-09 DIAGNOSIS — E1165 Type 2 diabetes mellitus with hyperglycemia: Principal | ICD-10-CM

## 2014-01-09 DIAGNOSIS — F419 Anxiety disorder, unspecified: Secondary | ICD-10-CM

## 2014-01-09 DIAGNOSIS — F411 Generalized anxiety disorder: Secondary | ICD-10-CM

## 2014-01-09 DIAGNOSIS — E785 Hyperlipidemia, unspecified: Secondary | ICD-10-CM

## 2014-01-09 DIAGNOSIS — IMO0001 Reserved for inherently not codable concepts without codable children: Secondary | ICD-10-CM

## 2014-01-09 LAB — BASIC METABOLIC PANEL
BUN: 14 mg/dL (ref 6–23)
CO2: 29 meq/L (ref 19–32)
Calcium: 9.6 mg/dL (ref 8.4–10.5)
Chloride: 103 mEq/L (ref 96–112)
Creatinine, Ser: 1.2 mg/dL (ref 0.4–1.5)
GFR: 70.74 mL/min (ref >60.00)
GLUCOSE: 92 mg/dL (ref 70–99)
POTASSIUM: 3.8 meq/L (ref 3.5–5.1)
SODIUM: 140 meq/L (ref 135–145)

## 2014-01-09 LAB — ALT: ALT: 36 U/L (ref 0–53)

## 2014-01-09 LAB — HEMOGLOBIN A1C: HEMOGLOBIN A1C: 6.1 % (ref 4.6–6.5)

## 2014-01-09 LAB — AST: AST: 28 U/L (ref 0–37)

## 2014-01-09 MED ORDER — ATORVASTATIN CALCIUM 40 MG PO TABS: 40.0000 mg | ORAL_TABLET | Freq: Every day | ORAL | Status: DC

## 2014-01-09 MED ORDER — METFORMIN HCL 850 MG PO TABS: ORAL_TABLET | ORAL | Status: DC

## 2014-01-09 MED ORDER — METOPROLOL SUCCINATE ER 25 MG PO TB24: ORAL_TABLET | ORAL | Status: DC

## 2014-01-09 MED ORDER — BUPROPION HCL ER (SR) 150 MG PO TB12: ORAL_TABLET | ORAL | Status: DC

## 2014-01-09 NOTE — Assessment & Plan Note (Signed)
Needs a RF, doing well

## 2014-01-09 NOTE — Progress Notes (Signed)
HPI:  48 year-old gentleman presenting for followup evaluation. The patient has CAD and initially presented with an inferior wall MI in 2010. He was treated with stenting of the PDA branch of the right coronary artery with a DES. He took dual antiplatelet therapy for one year and has remained on aspirin. He had nonobstructive disease of the left coronary artery and preserved left ventricular function.  The patient continues to do well. He does tae kwon do 4 days per week and has no symptoms with that. He specifically denies chest pain, chest pressure, shortness of breath, edema, or palpitations. His diabetes is under good control. His weight is up a little bit, but he just was on vacation for a week and had a bad week of eating.   Outpatient Encounter Prescriptions as of 01/09/2014  Medication Sig  . aspirin 81 MG tablet Take 1 tablet (81 mg total) by mouth daily.  Marland Kitchen atorvastatin (LIPITOR) 40 MG tablet Take 1 tablet (40 mg total) by mouth daily.  Marland Kitchen buPROPion (WELLBUTRIN SR) 150 MG 12 hr tablet Take 1 tablet by mouth twice a day.  Marland Kitchen glucose blood (ONE TOUCH ULTRA TEST) test strip Use as instructed  . metFORMIN (GLUCOPHAGE) 850 MG tablet Take 1 tablet (850mg  total) by mouth 2 (two) times daily with meal  . metoprolol succinate (TOPROL-XL) 25 MG 24 hr tablet TAKE 1 TABLET (25 MG TOTAL) BY MOUTH DAILY.  . nitroGLYCERIN (NITROSTAT) 0.4 MG SL tablet Place 1 tablet (0.4 mg total) under the tongue every 5 (five) minutes as needed.  . ONE TOUCH LANCETS MISC Check once daily.  . [DISCONTINUED] atorvastatin (LIPITOR) 40 MG tablet Take 1 tablet (40 mg total) by mouth daily.  . [DISCONTINUED] buPROPion (WELLBUTRIN SR) 150 MG 12 hr tablet Take 1 tablet by mouth twice a day. DUE for check up appointment > [phone] for additional refills.  . [DISCONTINUED] CRESTOR 20 MG tablet   . [DISCONTINUED] metFORMIN (GLUCOPHAGE) 850 MG tablet Take 1 tablet (850mg  total) by mouth 2 (two) times daily with meal  .  [DISCONTINUED] metoprolol succinate (TOPROL-XL) 25 MG 24 hr tablet TAKE 1 TABLET (25 MG TOTAL) BY MOUTH DAILY.    No Known Allergies  Past Medical History  Diagnosis Date  . CAD (coronary artery disease)     prior inferior MI in 2010 treated with stenting of the right PDA; had nonobstructive disease in the LAD and LCX with approximately 40% lesions. EF normal  . Hyperlipidemia   . Hypertension     white coat   . Anxiety   . Obesity   . Diabetes mellitus without complication     ROS: Negative except as per HPI  BP 129/74  Pulse 75  Ht 6\' 1"  (1.854 m)  Wt 239 lb (108.41 kg)  BMI 31.54 kg/m2  PHYSICAL EXAM: Pt is alert and oriented, NAD HEENT: normal Neck: JVP - normal, carotids 2+= without bruits Lungs: CTA bilaterally CV: RRR without murmur or gallop Abd: soft, NT, Positive BS, no hepatomegaly Ext: no C/C/E, distal pulses intact and equal Skin: warm/dry no rash  EKG:  Normal sinus rhythm 75 beats per minute, rightward axis, age indeterminate inferior infarct. No significant change from previous tracing.  ASSESSMENT AND PLAN: 1. Coronary artery disease with old MI. The patient is stable with no symptoms of angina. His medical program as appropriate with aspirin, a beta blocker, and a statin drug. He does have diabetes, but I am not going to add an ACE inhibitor as  he is normotensive.  2. Hyperlipidemia. Followed by Dr. Larose Kells. The patient recently changed to atorvastatin because of insurance issues. Followup lipids are planned.  See him back in followup in one year unless there are problems. Reviewed the importance of diet and weight loss. He is doing a good job with exercise.  Sherren Mocha 01/09/2014 2:14 PM

## 2014-01-09 NOTE — Progress Notes (Signed)
Subjective:    Patient ID: William Mckenzie, male    DOB: 1966-01-24, 48 y.o.   MRN: [id number]  DOS:  01/09/2014 Type of  visit: Office visit Diabetes, good compliance of medication, CBGs usually 90, 105. Last week he was not able to eat healthy and sugar went up to 195. Anxiety, needs a refill on Wellbutrin. CAD, asymptomatic, due to see Dr. Burt Knack today. High cholesterol, was switch from Crestor to Lipitor 2 weeks ago due to insurance issue.   ROS Denies chest pain, difficulty breathing or extremity edema No nausea, vomiting, diarrhea. No cough or sputum production. No visual disturbances or lower extremity paresthesias. Denies any myalgias or aches and pains since he changed chol meds   Past Medical History  Diagnosis Date  . CAD (coronary artery disease)     prior inferior MI in 2010 treated with stenting of the right PDA; had nonobstructive disease in the LAD and LCX with approximately 40% lesions. EF normal  . Hyperlipidemia   . Hypertension     white coat   . Anxiety   . Obesity   . Diabetes mellitus without complication     Past Surgical History  Procedure Laterality Date  . Knee surgery  12-2010    LEFT, meniscus . arthroscopy    History   Social History  . Marital Status: Married    Spouse Name: N/A    Number of Children: 1  . Years of Education: N/A   Occupational History  . [employer]   . CUSTOMER SERVICE [employer]   Social History Main Topics  . Smoking status: Never Smoker   . Smokeless tobacco: Former Systems developer     Comment: quit chewing tobacco 2010  . Alcohol Use: Yes     Comment: socially   . Drug Use: No  . Sexual Activity: Not on file   Other Topics Concern  . Not on file   Social History Narrative                     Medication List       This list is accurate as of: 01/09/14  5:43 PM.  Always use your most recent med list.               aspirin 81 MG tablet  Take 1 tablet (81 mg total) by mouth daily.     atorvastatin 40 MG tablet  Commonly known as:  LIPITOR  Take 1 tablet (40 mg total) by mouth daily.     buPROPion 150 MG 12 hr tablet  Commonly known as:  WELLBUTRIN SR  Take 1 tablet by mouth twice a day.     glucose blood test strip  Commonly known as:  ONE TOUCH ULTRA TEST  Use as instructed     metFORMIN 850 MG tablet  Commonly known as:  GLUCOPHAGE  Take 1 tablet (850mg  total) by mouth 2 (two) times daily with meal     metoprolol succinate 25 MG 24 hr tablet  Commonly known as:  TOPROL-XL  TAKE 1 TABLET (25 MG TOTAL) BY MOUTH DAILY.     nitroGLYCERIN 0.4 MG SL tablet  Commonly known as:  NITROSTAT  Place 1 tablet (0.4 mg total) under the tongue every 5 (five) minutes as needed.     ONE TOUCH LANCETS Misc  Check once daily.           Objective:   Physical Exam BP 132/76  Pulse 66  Temp(Src) 98  F (36.7 C)  Wt 237 lb (107.502 kg)  SpO2 99% General -- alert, well-developed, NAD.  Lungs -- normal respiratory effort, no intercostal retractions, no accessory muscle use, and normal breath sounds.  Heart-- normal rate, regular rhythm, no murmur.  DIABETIC FEET EXAM: No lower extremity edema Normal pedal pulses bilaterally Skin normal, nails normal, no calluses Pinprick examination of the feet normal. Neurologic--  alert & oriented X3. Speech normal, gait normal, strength normal in all extremities.  Psych-- Cognition and judgment appear intact. Cooperative with normal attention span and concentration. No anxious or depressed appearing.     Assessment & Plan:

## 2014-01-09 NOTE — Patient Instructions (Signed)
Your physician wants you to follow-up in: 1 YEAR with Dr Cooper.  You will receive a reminder letter in the mail two months in advance. If you don't receive a letter, please call our office to schedule the follow-up appointment.  Your physician recommends that you continue on your current medications as directed. Please refer to the Current Medication list given to you today.  

## 2014-01-09 NOTE — Patient Instructions (Signed)
Get your blood work before you leave   Next visit is for a physical exam by June 2015, fasting Please make an appointment       Diabetes and Foot Care Diabetes may cause you to have problems because of poor blood supply (circulation) to your feet and legs. This may cause the skin on your feet to become thinner, break easier, and heal more slowly. Your skin may become dry, and the skin may peel and crack. You may also have nerve damage in your legs and feet causing decreased feeling in them. You may not notice minor injuries to your feet that could lead to infections or more serious problems. Taking care of your feet is one of the most important things you can do for yourself.  HOME CARE INSTRUCTIONS  Wear shoes at all times, even in the house. Do not go barefoot. Bare feet are easily injured.  Check your feet daily for blisters, cuts, and redness. If you cannot see the bottom of your feet, use a mirror or ask someone for help.  Wash your feet with warm water (do not use hot water) and mild soap. Then pat your feet and the areas between your toes until they are completely dry. Do not soak your feet as this can dry your skin.  Apply a moisturizing lotion or petroleum jelly (that does not contain alcohol and is unscented) to the skin on your feet and to dry, brittle toenails. Do not apply lotion between your toes.  Trim your toenails straight across. Do not dig under them or around the cuticle. File the edges of your nails with an emery board or nail file.  Do not cut corns or calluses or try to remove them with medicine.  Wear clean socks or stockings every day. Make sure they are not too tight. Do not wear knee-high stockings since they may decrease blood flow to your legs.  Wear shoes that fit properly and have enough cushioning. To break in new shoes, wear them for just a few hours a day. This prevents you from injuring your feet. Always look in your shoes before you put them on to be sure  there are no objects inside.  Do not cross your legs. This may decrease the blood flow to your feet.  If you find a minor scrape, cut, or break in the skin on your feet, keep it and the skin around it clean and dry. These areas may be cleansed with mild soap and water. Do not cleanse the area with peroxide, alcohol, or iodine.  When you remove an adhesive bandage, be sure not to damage the skin around it.  If you have a wound, look at it several times a day to make sure it is healing.  Do not use heating pads or hot water bottles. They may burn your skin. If you have lost feeling in your feet or legs, you may not know it is happening until it is too late.  Make sure your health care provider performs a complete foot exam at least annually or more often if you have foot problems. Report any cuts, sores, or bruises to your health care provider immediately. SEEK MEDICAL CARE IF:   You have an injury that is not healing.  You have cuts or breaks in the skin.  You have an ingrown nail.  You notice redness on your legs or feet.  You feel burning or tingling in your legs or feet.  You have pain or  cramps in your legs and feet.  Your legs or feet are numb.  Your feet always feel cold. SEEK IMMEDIATE MEDICAL CARE IF:   There is increasing redness, swelling, or pain in or around a wound.  There is a red line that goes up your leg.  Pus is coming from a wound.  You develop a fever or as directed by your health care provider.  You notice a bad smell coming from an ulcer or wound. Document Released: 09/10/2000 Document Revised: 05/16/2013 Document Reviewed: 02/20/2013 [facility] Patient Information 2014 [place].

## 2014-01-09 NOTE — Assessment & Plan Note (Signed)
Seems to be doing well. Feet exam negative today, feet care discuss. Last microalbumin less than a year ago and negative. Plan: Refill medications, labs

## 2014-01-09 NOTE — Progress Notes (Signed)
Pre visit review using our clinic review tool, if applicable. No additional management support is needed unless otherwise documented below in the visit note. 

## 2014-01-09 NOTE — Assessment & Plan Note (Signed)
Change from Crestor to Lipitor due to insurance issues, started Lipitor 2 weeks ago, no apparent side effects. Plan: Rechecklabson return to the office

## 2014-01-10 ENCOUNTER — Encounter: Payer: Self-pay | Admitting: *Deleted

## 2014-03-15 ENCOUNTER — Telehealth: Payer: Self-pay | Admitting: *Deleted

## 2014-03-15 NOTE — Telephone Encounter (Signed)
Medication List and allergies: Reviewed and updated  90 Day supply/mail order: [facility] Local prescriptions:  [facility] [place]  Immunizations Due: PNA  A/P FH, PSH, or Personal IO:[id number] and updated Flu vaccine: 06/2013 Tdap: 2.2009 PNA: Never had   To Discuss with Provider: Not at this time

## 2014-03-18 ENCOUNTER — Encounter: Payer: Self-pay | Admitting: Internal Medicine

## 2014-03-18 ENCOUNTER — Ambulatory Visit (INDEPENDENT_AMBULATORY_CARE_PROVIDER_SITE_OTHER): Payer: 59 | Admitting: Internal Medicine

## 2014-03-18 VITALS — BP 110/74 | HR 65 | Temp 98.0°F | Ht 72.5 in | Wt 237.0 lb

## 2014-03-18 DIAGNOSIS — Z0000 Encounter for general adult medical examination without abnormal findings: Secondary | ICD-10-CM

## 2014-03-18 DIAGNOSIS — Z23 Encounter for immunization: Secondary | ICD-10-CM

## 2014-03-18 LAB — CBC WITH DIFFERENTIAL/PLATELET
BASOS ABS: 0 10*3/uL (ref 0.0–0.1)
Basophils Relative: 0.6 % (ref 0.0–3.0)
Eosinophils Absolute: 0.1 10*3/uL (ref 0.0–0.7)
Eosinophils Relative: 1.7 % (ref 0.0–5.0)
HCT: 44.8 % (ref 39.0–52.0)
Hemoglobin: 14.8 g/dL (ref 13.0–17.0)
LYMPHS PCT: 37.4 % (ref 12.0–46.0)
Lymphs Abs: 2.8 10*3/uL (ref 0.7–4.0)
MCHC: 33.1 g/dL (ref 30.0–36.0)
MCV: 93.4 fl (ref 78.0–100.0)
MONOS PCT: 6.4 % (ref 3.0–12.0)
Monocytes Absolute: 0.5 10*3/uL (ref 0.1–1.0)
NEUTROS PCT: 53.9 % (ref 43.0–77.0)
Neutro Abs: 4 10*3/uL (ref 1.4–7.7)
PLATELETS: 214 10*3/uL (ref 150.0–400.0)
RBC: 4.8 Mil/uL (ref 4.22–5.81)
RDW: 12.9 % (ref 11.5–15.5)
WBC: 7.4 10*3/uL (ref 4.0–10.5)

## 2014-03-18 LAB — LIPID PANEL
CHOLESTEROL: 121 mg/dL (ref 0–200)
HDL: 49.6 mg/dL (ref >39.00)
LDL Cholesterol: 63 mg/dL (ref 0–99)
NonHDL: 71.4
Total CHOL/HDL Ratio: 2
Triglycerides: 44 mg/dL (ref 0.0–149.0)
VLDL: 8.8 mg/dL (ref 0.0–40.0)

## 2014-03-18 NOTE — Progress Notes (Signed)
Pre visit review using our clinic review tool, if applicable. No additional management support is needed unless otherwise documented below in the visit note. 

## 2014-03-18 NOTE — Progress Notes (Signed)
Subjective:    Patient ID: William Mckenzie, male    DOB: [birth year], 48 y.o.   MRN: [id number]  DOS:  03/18/2014 Type of  Visit: CPX History In general feeling well, no concerns. Ambulatory BPs when checked are normal, CBGs usually 90, 110  ROS Diet-- usually healthy Exercise-- martial arts  No  CP, SOB  Denies  nausea, vomiting diarrhea, blood in the stools  (-) cough, sputum production (-) wheezing, occ chest congestion No dysuria, gross hematuria, difficulty urinating   No headaches  Denies dizziness       Past Medical History  Diagnosis Date  . CAD (coronary artery disease)     prior inferior MI in 2010 treated with stenting of the right PDA; had nonobstructive disease in the LAD and LCX with approximately 40% lesions. EF normal  . Hyperlipidemia   . Hypertension     white coat   . Anxiety   . Obesity   . Diabetes mellitus without complication     Past Surgical History  Procedure Laterality Date  . Knee surgery  12-2010    LEFT, meniscus . arthroscopy    History   Social History  . Marital Status: Married    Spouse Name: N/A    Number of Children: 1  . Years of Education: N/A   Occupational History  . [employer]   . CUSTOMER SERVICE [employer]   Social History Main Topics  . Smoking status: Never Smoker   . Smokeless tobacco: Former Systems developer     Comment: quit chewing tobacco 2010   . Alcohol Use: Yes     Comment: socially   . Drug Use: No  . Sexual Activity: Not on file   Other Topics Concern  . Not on file   Social History Narrative   Lives w/ wife and daughter                   Family History  Problem Relation Age of Onset  . CAD Other     GGF GF   . Colon cancer Neg Hx   . Prostate cancer Neg Hx   . Diabetes Neg Hx       Medication List       This list is accurate as of: 03/18/14  5:16 PM.  Always use your most recent med list.               aspirin 81 MG tablet  Take 1 tablet (81 mg total) by mouth daily.     atorvastatin 40 MG tablet  Commonly known as:  LIPITOR  Take 1 tablet (40 mg total) by mouth daily.     buPROPion 150 MG 12 hr tablet  Commonly known as:  WELLBUTRIN SR  Take 1 tablet by mouth twice a day.     glucose blood test strip  Commonly known as:  ONE TOUCH ULTRA TEST  Use as instructed     metFORMIN 850 MG tablet  Commonly known as:  GLUCOPHAGE  Take 1 tablet (850mg  total) by mouth 2 (two) times daily with meal     metoprolol succinate 25 MG 24 hr tablet  Commonly known as:  TOPROL-XL  TAKE 1 TABLET (25 MG TOTAL) BY MOUTH DAILY.     nitroGLYCERIN 0.4 MG SL tablet  Commonly known as:  NITROSTAT  Place 1 tablet (0.4 mg total) under the tongue every 5 (five) minutes as needed.     ONE TOUCH LANCETS Misc  Check once  daily.           Objective:   Physical Exam BP 110/74  Pulse 65  Temp(Src) 98 F (36.7 C)  Ht 6' 0.5" (1.842 m)  Wt 237 lb (107.502 kg)  BMI 31.68 kg/m2  SpO2 95%  General -- alert, well-developed, NAD.  Neck --no thyromegaly  HEENT-- Not pale. Lungs -- normal respiratory effort, no intercostal retractions, no accessory muscle use, and normal breath sounds.  Heart-- normal rate, regular rhythm, no murmur.  Abdomen-- Not distended, good bowel sounds,soft, non-tender. Extremities-- no pretibial edema bilaterally  Neurologic--  alert & oriented X3. Speech normal, gait appropriate for age, strength symmetric and appropriate for age.  Psych-- Cognition and judgment appear intact. Cooperative with normal attention span and concentration. No anxious or depressed appearing.       Assessment & Plan:

## 2014-03-18 NOTE — Patient Instructions (Signed)
Get your blood work before you leave     Next visit is for routine check up regards your blood sugar , blood pressure   in 4 months  No need to come back fasting Please make an appointment     At some point this year the clinic will relocate to  [place] and [address] (10 minutes form here)  [place]  [place], [place] [address] [phone]

## 2014-03-18 NOTE — Assessment & Plan Note (Addendum)
Tdap 2009 pnm 23 shot today Never had a cscope No family history of   colon cancer or prostate cancer Lifestyle is very good  Labs Chronic medical problems seem controlled, return to the office in 4 months

## 2014-03-20 ENCOUNTER — Encounter: Payer: Self-pay | Admitting: *Deleted

## 2014-07-15 ENCOUNTER — Ambulatory Visit: Payer: 59 | Admitting: Internal Medicine

## 2014-08-12 ENCOUNTER — Ambulatory Visit (INDEPENDENT_AMBULATORY_CARE_PROVIDER_SITE_OTHER): Payer: 59

## 2014-08-12 ENCOUNTER — Encounter: Payer: Self-pay | Admitting: Internal Medicine

## 2014-08-12 ENCOUNTER — Ambulatory Visit (INDEPENDENT_AMBULATORY_CARE_PROVIDER_SITE_OTHER): Payer: 59 | Admitting: Internal Medicine

## 2014-08-12 VITALS — BP 122/76 | HR 58 | Temp 97.8°F | Wt 227.4 lb

## 2014-08-12 DIAGNOSIS — Z23 Encounter for immunization: Secondary | ICD-10-CM

## 2014-08-12 DIAGNOSIS — E119 Type 2 diabetes mellitus without complications: Secondary | ICD-10-CM

## 2014-08-12 DIAGNOSIS — E785 Hyperlipidemia, unspecified: Secondary | ICD-10-CM

## 2014-08-12 DIAGNOSIS — I25119 Atherosclerotic heart disease of native coronary artery with unspecified angina pectoris: Secondary | ICD-10-CM

## 2014-08-12 DIAGNOSIS — M25562 Pain in left knee: Secondary | ICD-10-CM

## 2014-08-12 MED ORDER — METFORMIN HCL 850 MG PO TABS: ORAL_TABLET | ORAL | Status: DC

## 2014-08-12 MED ORDER — ATORVASTATIN CALCIUM 40 MG PO TABS: 40.0000 mg | ORAL_TABLET | Freq: Every day | ORAL | Status: DC

## 2014-08-12 MED ORDER — METOPROLOL SUCCINATE ER 25 MG PO TB24: ORAL_TABLET | ORAL | Status: DC

## 2014-08-12 MED ORDER — BUPROPION HCL ER (SR) 150 MG PO TB12: ORAL_TABLET | ORAL | Status: DC

## 2014-08-12 NOTE — Assessment & Plan Note (Signed)
Doing great with lifestyle, check a A1c, continue with metformin but will consider decrease dose

## 2014-08-12 NOTE — Patient Instructions (Signed)
Get your blood work before you leave    Please come back to the office by 02-2015  for a physical exam. Come back fasting    

## 2014-08-12 NOTE — Progress Notes (Signed)
Pre visit review using our clinic review tool, if applicable. No additional management support is needed unless otherwise documented below in the visit note. 

## 2014-08-12 NOTE — Progress Notes (Signed)
Subjective:    Patient ID: William Mckenzie, male    DOB: 1966/04/16, 48 y.o.   MRN: [id number]  DOS:  08/12/2014 Type of visit - description : rov Interval history: In general doing well. Lifestyle has improved, has lost about 10 pounds. Reports having problems with the left knee: on-off pain. See assessment and plan Good compliance w/  all medications, needs refills. Ambulatory blood sugar 95-105 No ambulatory BPs   ROS Denies chest pain and difficulty breathing or lower extremity edema No nausea vomiting  Past Medical History  Diagnosis Date  . CAD (coronary artery disease)     prior inferior MI in 2010 treated with stenting of the right PDA; had nonobstructive disease in the LAD and LCX with approximately 40% lesions. EF normal  . Hyperlipidemia   . Hypertension     white coat   . Anxiety   . Obesity   . Diabetes mellitus without complication     Past Surgical History  Procedure Laterality Date  . Knee surgery  12-2010    LEFT, meniscus . arthroscopy    History   Social History  . Marital Status: Married    Spouse Name: N/A    Number of Children: 1  . Years of Education: N/A   Occupational History  . [employer]   . CUSTOMER SERVICE [employer]   Social History Main Topics  . Smoking status: Never Smoker   . Smokeless tobacco: Former Systems developer     Comment: quit chewing tobacco 2010   . Alcohol Use: Yes     Comment: socially   . Drug Use: No  . Sexual Activity: Not on file   Other Topics Concern  . Not on file   Social History Narrative   Lives w/ wife and daughter                      Medication List       This list is accurate as of: 08/12/14  5:50 PM.  Always use your most recent med list.               aspirin 81 MG tablet  Take 1 tablet (81 mg total) by mouth daily.     atorvastatin 40 MG tablet  Commonly known as:  LIPITOR  Take 1 tablet (40 mg total) by mouth daily.     buPROPion 150 MG 12 hr tablet  Commonly known as:   WELLBUTRIN SR  Take 1 tablet by mouth twice a day.     glucose blood test strip  Commonly known as:  ONE TOUCH ULTRA TEST  Use as instructed     metFORMIN 850 MG tablet  Commonly known as:  GLUCOPHAGE  Take 1 tablet (850mg  total) by mouth 2 (two) times daily with meal     metoprolol succinate 25 MG 24 hr tablet  Commonly known as:  TOPROL-XL  TAKE 1 TABLET (25 MG TOTAL) BY MOUTH DAILY.     nitroGLYCERIN 0.4 MG SL tablet  Commonly known as:  NITROSTAT  Place 1 tablet (0.4 mg total) under the tongue every 5 (five) minutes as needed.     ONE TOUCH LANCETS Misc  Check once daily.           Objective:   Physical Exam BP 122/76 mmHg  Pulse 58  Temp(Src) 97.8 F (36.6 C) (Oral)  Wt 227 lb 6 oz (103.137 kg)  SpO2 97%  General -- alert, well-developed, NAD.  Lungs -- normal respiratory effort, no intercostal retractions, no accessory muscle use, and normal breath sounds.  Heart-- normal rate, regular rhythm, no murmur.   Extremities-- no pretibial edema bilaterally  Neurologic--  alert & oriented X3. Speech normal, gait appropriate for age, strength symmetric and appropriate for age.   Psych-- Cognition and judgment appear intact. Cooperative with normal attention span and concentration. No anxious or depressed appearing.       Assessment & Plan:   CAD, asymptomatic, continue with cardiovascular risk modification  Hyperlipidemia, well controlled, check labs

## 2014-08-12 NOTE — Assessment & Plan Note (Signed)
Having problems with the left knee, pain on and off, had a meniscal repair many years ago by Dr. Dorna Leitz. Plan: refer back to Dr. Berenice Primas

## 2014-08-13 LAB — BASIC METABOLIC PANEL
BUN: 11 mg/dL (ref 6–23)
CHLORIDE: 109 meq/L (ref 96–112)
CO2: 26 mEq/L (ref 19–32)
Calcium: 9 mg/dL (ref 8.4–10.5)
Creatinine, Ser: 1 mg/dL (ref 0.4–1.5)
GFR: 80.84 mL/min (ref >60.00)
Glucose, Bld: 100 mg/dL — ABNORMAL HIGH (ref 70–99)
Potassium: 4.4 mEq/L (ref 3.5–5.1)
Sodium: 140 mEq/L (ref 135–145)

## 2014-08-13 LAB — HEMOGLOBIN A1C: Hgb A1c MFr Bld: 6.4 % (ref 4.6–6.5)

## 2014-11-04 ENCOUNTER — Emergency Department (HOSPITAL_BASED_OUTPATIENT_CLINIC_OR_DEPARTMENT_OTHER): Payer: 59

## 2014-11-04 ENCOUNTER — Observation Stay (HOSPITAL_COMMUNITY): Payer: 59

## 2014-11-04 ENCOUNTER — Encounter (HOSPITAL_BASED_OUTPATIENT_CLINIC_OR_DEPARTMENT_OTHER): Payer: Self-pay | Admitting: *Deleted

## 2014-11-04 ENCOUNTER — Observation Stay (HOSPITAL_BASED_OUTPATIENT_CLINIC_OR_DEPARTMENT_OTHER)
Admission: EM | Admit: 2014-11-04 | Discharge: 2014-11-05 | Disposition: A | Discharge status: Home / Self Care | Payer: 59 | Attending: Cardiovascular Disease | Admitting: Cardiovascular Disease

## 2014-11-04 DIAGNOSIS — Z87891 Personal history of nicotine dependence: Secondary | ICD-10-CM | POA: Insufficient documentation

## 2014-11-04 DIAGNOSIS — R5383 Other fatigue: Principal | ICD-10-CM | POA: Insufficient documentation

## 2014-11-04 DIAGNOSIS — I25118 Atherosclerotic heart disease of native coronary artery with other forms of angina pectoris: Secondary | ICD-10-CM

## 2014-11-04 DIAGNOSIS — E1159 Type 2 diabetes mellitus with other circulatory complications: Secondary | ICD-10-CM

## 2014-11-04 DIAGNOSIS — E780 Pure hypercholesterolemia: Secondary | ICD-10-CM | POA: Insufficient documentation

## 2014-11-04 DIAGNOSIS — E669 Obesity, unspecified: Secondary | ICD-10-CM | POA: Insufficient documentation

## 2014-11-04 DIAGNOSIS — R079 Chest pain, unspecified: Secondary | ICD-10-CM | POA: Diagnosis present

## 2014-11-04 DIAGNOSIS — Z79899 Other long term (current) drug therapy: Secondary | ICD-10-CM | POA: Insufficient documentation

## 2014-11-04 DIAGNOSIS — I10 Essential (primary) hypertension: Secondary | ICD-10-CM | POA: Insufficient documentation

## 2014-11-04 DIAGNOSIS — E785 Hyperlipidemia, unspecified: Secondary | ICD-10-CM | POA: Diagnosis present

## 2014-11-04 DIAGNOSIS — E119 Type 2 diabetes mellitus without complications: Secondary | ICD-10-CM | POA: Insufficient documentation

## 2014-11-04 DIAGNOSIS — R208 Other disturbances of skin sensation: Secondary | ICD-10-CM

## 2014-11-04 DIAGNOSIS — I25119 Atherosclerotic heart disease of native coronary artery with unspecified angina pectoris: Secondary | ICD-10-CM | POA: Diagnosis not present

## 2014-11-04 DIAGNOSIS — Z9861 Coronary angioplasty status: Secondary | ICD-10-CM

## 2014-11-04 DIAGNOSIS — F419 Anxiety disorder, unspecified: Secondary | ICD-10-CM | POA: Insufficient documentation

## 2014-11-04 DIAGNOSIS — I200 Unstable angina: Secondary | ICD-10-CM

## 2014-11-04 DIAGNOSIS — I2510 Atherosclerotic heart disease of native coronary artery without angina pectoris: Secondary | ICD-10-CM

## 2014-11-04 DIAGNOSIS — I252 Old myocardial infarction: Secondary | ICD-10-CM | POA: Insufficient documentation

## 2014-11-04 DIAGNOSIS — Z7982 Long term (current) use of aspirin: Secondary | ICD-10-CM | POA: Insufficient documentation

## 2014-11-04 DIAGNOSIS — I201 Angina pectoris with documented spasm: Secondary | ICD-10-CM | POA: Diagnosis present

## 2014-11-04 DIAGNOSIS — R200 Anesthesia of skin: Secondary | ICD-10-CM

## 2014-11-04 HISTORY — DX: Acute myocardial infarction, unspecified: I21.9

## 2014-11-04 LAB — CBC WITH DIFFERENTIAL/PLATELET
Basophils Absolute: 0 10*3/uL (ref 0.0–0.1)
Basophils Relative: 0 % (ref 0–1)
Eosinophils Absolute: 0.1 10*3/uL (ref 0.0–0.7)
Eosinophils Relative: 1 % (ref 0–5)
HCT: 40.8 % (ref 39.0–52.0)
Hemoglobin: 13.9 g/dL (ref 13.0–17.0)
LYMPHS PCT: 49 % — AB (ref 12–46)
Lymphs Abs: 3 10*3/uL (ref 0.7–4.0)
MCH: 30.5 pg (ref 26.0–34.0)
MCHC: 34.1 g/dL (ref 30.0–36.0)
MCV: 89.7 fL (ref 78.0–100.0)
Monocytes Absolute: 0.4 10*3/uL (ref 0.1–1.0)
Monocytes Relative: 6 % (ref 3–12)
NEUTROS ABS: 2.7 10*3/uL (ref 1.7–7.7)
Neutrophils Relative %: 44 % (ref 43–77)
PLATELETS: 235 10*3/uL (ref 150–400)
RBC: 4.55 MIL/uL (ref 4.22–5.81)
RDW: 12.7 % (ref 11.5–15.5)
WBC: 6.1 10*3/uL (ref 4.0–10.5)

## 2014-11-04 LAB — TROPONIN I
Troponin I: <0.03 ng/mL (ref <0.031)
Troponin I: <0.03 ng/mL (ref <0.031)

## 2014-11-04 LAB — BASIC METABOLIC PANEL
Anion gap: 6 (ref 5–15)
BUN: 15 mg/dL (ref 6–23)
CALCIUM: 9.2 mg/dL (ref 8.4–10.5)
CO2: 24 mmol/L (ref 19–32)
Chloride: 107 mmol/L (ref 96–112)
Creatinine, Ser: 1.04 mg/dL (ref 0.50–1.35)
GFR calc Af Amer: >90 mL/min (ref >90)
GFR, EST NON AFRICAN AMERICAN: 83 mL/min — AB (ref >90)
Glucose, Bld: 127 mg/dL — ABNORMAL HIGH (ref 70–99)
Potassium: 4 mmol/L (ref 3.5–5.1)
Sodium: 137 mmol/L (ref 135–145)

## 2014-11-04 LAB — CBC
HEMATOCRIT: 44.2 % (ref 39.0–52.0)
HEMOGLOBIN: 14.9 g/dL (ref 13.0–17.0)
MCH: 30.5 pg (ref 26.0–34.0)
MCHC: 33.7 g/dL (ref 30.0–36.0)
MCV: 90.6 fL (ref 78.0–100.0)
Platelets: 254 10*3/uL (ref 150–400)
RBC: 4.88 MIL/uL (ref 4.22–5.81)
RDW: 12.6 % (ref 11.5–15.5)
WBC: 7.2 10*3/uL (ref 4.0–10.5)

## 2014-11-04 LAB — PROTIME-INR
INR: 0.92 (ref 0.00–1.49)
INR: 1.01 (ref 0.00–1.49)
PROTHROMBIN TIME: 13.4 s (ref 11.6–15.2)
Prothrombin Time: 12.4 seconds (ref 11.6–15.2)

## 2014-11-04 LAB — COMPREHENSIVE METABOLIC PANEL
ALT: 32 U/L (ref 0–53)
AST: 25 U/L (ref 0–37)
Albumin: 3.9 g/dL (ref 3.5–5.2)
Alkaline Phosphatase: 61 U/L (ref 39–117)
Anion gap: 4 — ABNORMAL LOW (ref 5–15)
BILIRUBIN TOTAL: 0.5 mg/dL (ref 0.3–1.2)
BUN: 16 mg/dL (ref 6–23)
CO2: 30 mmol/L (ref 19–32)
Calcium: 9 mg/dL (ref 8.4–10.5)
Chloride: 106 mmol/L (ref 96–112)
Creatinine, Ser: 1.29 mg/dL (ref 0.50–1.35)
GFR calc Af Amer: 74 mL/min — ABNORMAL LOW (ref >90)
GFR calc non Af Amer: 64 mL/min — ABNORMAL LOW (ref >90)
Glucose, Bld: 133 mg/dL — ABNORMAL HIGH (ref 70–99)
Potassium: 3.7 mmol/L (ref 3.5–5.1)
Sodium: 140 mmol/L (ref 135–145)
Total Protein: 6.2 g/dL (ref 6.0–8.3)

## 2014-11-04 LAB — APTT
APTT: 29 s (ref 24–37)
aPTT: 28 seconds (ref 24–37)

## 2014-11-04 LAB — MAGNESIUM: Magnesium: 2.1 mg/dL (ref 1.5–2.5)

## 2014-11-04 LAB — MRSA PCR SCREENING: MRSA by PCR: NEGATIVE

## 2014-11-04 MED ORDER — ZOLPIDEM TARTRATE 5 MG PO TABS
5.0000 mg | ORAL_TABLET | Freq: Once | ORAL | Status: AC
  Administered 2014-11-04: 5 mg via ORAL
  Filled 2014-11-04: qty 1

## 2014-11-04 MED ORDER — ATORVASTATIN CALCIUM 40 MG PO TABS
40.0000 mg | ORAL_TABLET | Freq: Every day | ORAL | Status: DC
  Administered 2014-11-05: 40 mg via ORAL
  Filled 2014-11-04: qty 1

## 2014-11-04 MED ORDER — ONDANSETRON HCL 4 MG/2ML IJ SOLN: 4.0000 mg | Freq: Three times a day (TID) | INTRAMUSCULAR | Status: DC | PRN

## 2014-11-04 MED ORDER — NITROGLYCERIN 0.4 MG SL SUBL: 0.4000 mg | SUBLINGUAL_TABLET | SUBLINGUAL | Status: DC | PRN

## 2014-11-04 MED ORDER — HEPARIN SODIUM (PORCINE) 5000 UNIT/ML IJ SOLN
5000.0000 [IU] | Freq: Three times a day (TID) | INTRAMUSCULAR | Status: DC
  Administered 2014-11-04 – 2014-11-05 (×3): 5000 [IU] via SUBCUTANEOUS
  Filled 2014-11-04 (×6): qty 1

## 2014-11-04 MED ORDER — BUPROPION HCL ER (SR) 150 MG PO TB12
150.0000 mg | ORAL_TABLET | Freq: Two times a day (BID) | ORAL | Status: DC
  Administered 2014-11-04 – 2014-11-05 (×2): 150 mg via ORAL
  Filled 2014-11-04 (×3): qty 1

## 2014-11-04 MED ORDER — METFORMIN HCL 850 MG PO TABS
850.0000 mg | ORAL_TABLET | Freq: Two times a day (BID) | ORAL | Status: DC
  Filled 2014-11-04 (×3): qty 1

## 2014-11-04 MED ORDER — ACETAMINOPHEN 325 MG PO TABS: 650.0000 mg | ORAL_TABLET | ORAL | Status: DC | PRN

## 2014-11-04 MED ORDER — ASPIRIN EC 81 MG PO TBEC
81.0000 mg | DELAYED_RELEASE_TABLET | Freq: Every day | ORAL | Status: DC
  Administered 2014-11-05: 81 mg via ORAL
  Filled 2014-11-04: qty 1

## 2014-11-04 MED ORDER — SODIUM CHLORIDE 0.9 % IJ SOLN
3.0000 mL | Freq: Two times a day (BID) | INTRAMUSCULAR | Status: DC
  Administered 2014-11-04 – 2014-11-05 (×2): 3 mL via INTRAVENOUS

## 2014-11-04 MED ORDER — ONDANSETRON HCL 4 MG/2ML IJ SOLN: 4.0000 mg | Freq: Four times a day (QID) | INTRAMUSCULAR | Status: DC | PRN

## 2014-11-04 MED ORDER — ASPIRIN 81 MG PO CHEW
324.0000 mg | CHEWABLE_TABLET | Freq: Once | ORAL | Status: AC
  Administered 2014-11-04: 324 mg via ORAL
  Filled 2014-11-04: qty 4

## 2014-11-04 MED ORDER — SODIUM CHLORIDE 0.9 % IJ SOLN: 3.0000 mL | INTRAMUSCULAR | Status: DC | PRN

## 2014-11-04 MED ORDER — SODIUM CHLORIDE 0.9 % IV SOLN: 250.0000 mL | INTRAVENOUS | Status: DC | PRN

## 2014-11-04 MED ORDER — METOPROLOL SUCCINATE ER 25 MG PO TB24
25.0000 mg | ORAL_TABLET | Freq: Every day | ORAL | Status: DC
  Filled 2014-11-04: qty 1

## 2014-11-04 MED ORDER — ZOLPIDEM TARTRATE 5 MG PO TABS: 5.0000 mg | ORAL_TABLET | Freq: Once | ORAL | Status: DC

## 2014-11-04 MED ORDER — GI COCKTAIL ~~LOC~~
30.0000 mL | Freq: Once | ORAL | Status: AC
  Administered 2014-11-04: 30 mL via ORAL
  Filled 2014-11-04: qty 30

## 2014-11-04 NOTE — Progress Notes (Signed)
11/04/2014 patient was transfer from [facility] to [facility] at 1850. He is alert, oriented, ambulatory. Patient had no c/o chest pain. He was place on telemetry and cardiology was made aware of patient arrival to unit. [facility] RN.

## 2014-11-04 NOTE — H&P (Signed)
Primary Physician: Dr. Larose Kells Primary Cardiologist:  Burt Knack   HPI: Patient is a 49 yo who presents for CP.  He has a history of CAD, HL, HTN and DM   He initially presented with an inferior wall MI in 2010(N/V, back pain) He was treated with stenting of the PDA branch of the right coronary artery with a DES. He took dual antiplatelet therapy for one year and has remained on aspirin. He had nonobstructive disease of the left coronary artery and LCx and preserved left ventricular function. Last night after watching the football game around 10:35pm the patient developed tingling of the face and neck on both sides that got very intense but no CP. He took a NTG around 11pm with no improvement in the tingling of his face.  He got very panicked.  He went to bed around 1:30am and still had the tingling and awakened at 2:30am and it was still present.  He went back to sleep and  when he awakened at 5am it was gone but he felt very weak and he was jittery.  He went on to work in [place] and while driving to work he felt nauseated with some mild back pain that was very similar to the discomfort he had with his MI. He says that the nausea and back pain lasted about 45 minutes.   He felt weak, diaphoretic and jittery after getting to work in [place].  His co workers said he looked pale and he went back home to [facility].   Since getting back to [place] today he has been clammy but his other symptoms have resolved.  He denies any symptoms in his arm, shoulder or jaw. His facial tingling has stopped and he denies chest pain or shortness of breath. He denies any symptoms of CP or SOB since his MI.       Past Medical History  Diagnosis Date  . CAD (coronary artery disease)     prior inferior MI in 2010 treated with stenting of the right PDA; had nonobstructive disease in the LAD and LCX with approximately 40% lesions. EF normal  . Hyperlipidemia   . Hypertension     white coat   . Anxiety   . Obesity   .  Diabetes mellitus without complication   . Myocardial infarction      No Known Allergies  History   Social History  . Marital Status: Married    Spouse Name: N/A    Number of Children: 1  . Years of Education: N/A   Occupational History  . former [employer]   . CUSTOMER SERVICE [employer]   Social History Main Topics  . Smoking status: Never Smoker   . Smokeless tobacco: Former Systems developer     Comment: quit chewing tobacco 2010   . Alcohol Use: Yes     Comment: socially   . Drug Use: No  . Sexual Activity: Not on file   Other Topics Concern  . Not on file   Social History Narrative   Lives w/ wife and daughter                  Family History  Problem Relation Age of Onset  . CAD Other     GGF GF   . Colon cancer Neg Hx   . Prostate cancer Neg Hx   . Diabetes Neg Hx   . Hypertension Mother     REVIEW OF SYSTEMS:  All systems reviewed  Negative to the above problem except as noted above.    PHYSICAL EXAM: Filed Vitals:   11/04/14 1907  BP: 115/65  Pulse:   Temp: 97.6 F (36.4 C)  Resp:     No intake or output data in the 24 hours ending 11/04/14 1937  General:  Well appearing. No respiratory difficulty HEENT: normal Neck: supple. no JVD. Carotids 2+ bilat; no bruits. No lymphadenopathy or thryomegaly appreciated. Cor: PMI nondisplaced. Regular rate & rhythm. No rubs, gallops or murmurs. Lungs: clear Abdomen: soft, nontender, nondistended. No hepatosplenomegaly. No bruits or masses. Good bowel sounds. Extremities: no cyanosis, clubbing, rash, edema Neuro: alert & oriented x 3, cranial nerves grossly intact. moves all 4 extremities w/o difficulty. Affect pleasant.  ECG: NSR with no ST changes  Results for orders placed or performed during the hospital encounter of 11/04/14 (from the past 24 hour(s))  Troponin I     Status: None   Collection Time: 11/04/14 12:20 PM  Result Value Ref Range   Troponin I <0.03 <0.031 ng/mL  CBC     Status: None   Collection  Time: 11/04/14 12:20 PM  Result Value Ref Range   WBC 7.2 4.0 - 10.5 K/uL   RBC 4.88 4.22 - 5.81 MIL/uL   Hemoglobin 14.9 13.0 - 17.0 g/dL   HCT 44.2 39.0 - 52.0 %   MCV 90.6 78.0 - 100.0 fL   MCH 30.5 26.0 - 34.0 pg   MCHC 33.7 30.0 - 36.0 g/dL   RDW 12.6 11.5 - 15.5 %   Platelets 254 150 - 400 K/uL  Basic metabolic panel     Status: Abnormal   Collection Time: 11/04/14 12:20 PM  Result Value Ref Range   Sodium 137 135 - 145 mmol/L   Potassium 4.0 3.5 - 5.1 mmol/L   Chloride 107 96 - 112 mmol/L   CO2 24 19 - 32 mmol/L   Glucose, Bld 127 (H) 70 - 99 mg/dL   BUN 15 6 - 23 mg/dL   Creatinine, Ser 1.04 0.50 - 1.35 mg/dL   Calcium 9.2 8.4 - 10.5 mg/dL   GFR calc non Af Amer 83 (L) >90 mL/min   GFR calc Af Amer >90 >90 mL/min   Anion gap 6 5 - 15  APTT     Status: None   Collection Time: 11/04/14 12:20 PM  Result Value Ref Range   aPTT 28 24 - 37 seconds  Protime-INR     Status: None   Collection Time: 11/04/14 12:20 PM  Result Value Ref Range   Prothrombin Time 12.4 11.6 - 15.2 seconds   INR 0.92 0.00 - 1.49   Dg Chest 2 View  11/04/2014   CLINICAL DATA:  3 hr history of facial numbness. Weakness and sweating. History of prior myocardial infarct.  EXAM: CHEST  2 VIEW  COMPARISON:  October 27, 2012  FINDINGS: There is no edema or consolidation. Heart size and pulmonary vascularity are normal. No adenopathy. No bone lesions. Mild anterior eventration of the right hemidiaphragm is stable.  IMPRESSION: No edema or consolidation.   Electronically Signed   By: Lowella Grip M.D.   On: 11/04/2014 13:34     ASSESSMENT: 1.  Possible anginal equivalent with mild back pain, nausea and prolonged episode of facial tingling.  The back pain was mild and only occurred today.  The nausea lasted all day but is gone now.  The facial tingling only occurred last night and throughout the night for a total of about  7 hours.  He has felt weak, jittery and diaphoretic throughout the day.  Initial  troponin was normal and EKG is nonischemic.  He denied any chest pain or SOB but did not have CP with his prior MI.  He stressed that his back pain was very mild.   2.  ASCAD with remote MI with occlusion of the PDA s/p PCI with residual nonobstructive disease of the LAD and LCx of 40%.  Normal LVF. 3.  Type II DM 4.  HTN 5.  Dyslipidemia  PLAN: 1.  Admit to tele bed 2.  Cycle cardiac enzymes 3.  Hold on full dose anticoagulation unless enzymes become positive or he has recurrent CP 4.  NPO after MN 5.  Stress myoview in am to rule out ischemia if enzymes negative 6.  Check 2D echo 7.  Check Head CT without contrast given neurological changes tonight

## 2014-11-04 NOTE — ED Notes (Signed)
Patient transported to X-ray 

## 2014-11-04 NOTE — ED Provider Notes (Signed)
CSN: [id number]     Arrival date & time 11/04/14  1157 History   First MD Initiated Contact with Patient 11/04/14 1210     Chief Complaint  Patient presents with  . Fatigue     (Consider location/radiation/quality/duration/timing/severity/associated sxs/prior Treatment) HPI Comments: The patient is a 49 year old male, prior myocardial infarction of the inferior wall requiring stenting of the PDA off of the right coronary in 2010. He was placed on dual and antiplatelet therapy for one year and has since then been maintained on a baby aspirin. He states that his initial symptoms were nausea, sweating, back discomfort. Last night after watching the football game the patient developed tingling of the face followed by nausea and an intense back pain. This has since improved but is still present. He denies any symptoms in his arm, shoulder or jaw. His facial tingling has stopped and he denies chest pain or shortness of breath. He did take a nitroglycerin at 1:00 in the morning which she believes did help his symptoms, he has had a baby aspirin. He denies swelling of the legs, reports that he has stress tests every couple of years, has not had any catheterizations since his initial heart attack. He does have high cholesterol, hypertension and diabetes, he does not smoke cigarettes, he does have early family heart disease.  The history is provided by the patient, the spouse and medical records.    Past Medical History  Diagnosis Date  . CAD (coronary artery disease)     prior inferior MI in 2010 treated with stenting of the right PDA; had nonobstructive disease in the LAD and LCX with approximately 40% lesions. EF normal  . Hyperlipidemia   . Hypertension     white coat   . Anxiety   . Obesity   . Diabetes mellitus without complication   . Myocardial infarction    Past Surgical History  Procedure Laterality Date  . Knee surgery  12-2010    LEFT, meniscus . arthroscopy   Family History  Problem  Relation Age of Onset  . CAD Other     GGF GF   . Colon cancer Neg Hx   . Prostate cancer Neg Hx   . Diabetes Neg Hx    History  Substance Use Topics  . Smoking status: Never Smoker   . Smokeless tobacco: Former Systems developer     Comment: quit chewing tobacco 2010   . Alcohol Use: Yes     Comment: socially     Review of Systems  All other systems reviewed and are negative.     Allergies  Review of patient's allergies indicates no known allergies.  Home Medications   Prior to Admission medications   Medication Sig Start Date End Date Taking? Authorizing Provider  aspirin 81 MG tablet Take 1 tablet (81 mg total) by mouth daily. 01/08/13   Blane Ohara, MD  atorvastatin (LIPITOR) 40 MG tablet Take 1 tablet (40 mg total) by mouth daily. 08/12/14   Colon Branch, MD  buPROPion [facility] SR) 150 MG 12 hr tablet Take 1 tablet by mouth twice a day. 08/12/14   Colon Branch, MD  glucose blood (ONE TOUCH ULTRA TEST) test strip Use as instructed 11/22/12   Colon Branch, MD  metFORMIN (GLUCOPHAGE) 850 MG tablet Take 1 tablet (850mg  total) by mouth 2 (two) times daily with meal 08/12/14   Colon Branch, MD  metoprolol succinate (TOPROL-XL) 25 MG 24 hr tablet TAKE 1 TABLET (25 MG TOTAL)  BY MOUTH DAILY. 08/12/14   Colon Branch, MD  nitroGLYCERIN (NITROSTAT) 0.4 MG SL tablet Place 1 tablet (0.4 mg total) under the tongue every 5 (five) minutes as needed. 01/08/13   Blane Ohara, MD  ONE [facility] LANCETS MISC Check once daily. 11/22/12   Colon Branch, MD   BP 128/72 mmHg  Pulse 57  Temp(Src) 97.9 F (36.6 C) (Oral)  Resp 18  SpO2 100% Physical Exam  Constitutional: He appears well-developed and well-nourished. No distress.  HENT:  Head: Normocephalic and atraumatic.  Mouth/Throat: Oropharynx is clear and moist. No oropharyngeal exudate.  Eyes: Conjunctivae and EOM are normal. Pupils are equal, round, and reactive to light. Right eye exhibits no discharge. Left eye exhibits no discharge. No scleral  icterus.  Neck: Normal range of motion. Neck supple. No JVD present. No thyromegaly present.  Cardiovascular: Normal rate, regular rhythm, normal heart sounds and intact distal pulses.  Exam reveals no gallop and no friction rub.   No murmur heard. Pulmonary/Chest: Effort normal and breath sounds normal. No respiratory distress. He has no wheezes. He has no rales.  Abdominal: Soft. Bowel sounds are normal. He exhibits no distension and no mass. There is no tenderness.  Musculoskeletal: Normal range of motion. He exhibits no edema or tenderness.  Lymphadenopathy:    He has no cervical adenopathy.  Neurological: He is alert. Coordination normal.  Skin: Skin is warm and dry. No rash noted. No erythema.  Psychiatric: He has a normal mood and affect. His behavior is normal.  Nursing note and vitals reviewed.   ED Course  Procedures (including critical care time) Labs Review Labs Reviewed  BASIC METABOLIC PANEL - Abnormal; Notable for the following:    Glucose, Bld 127 (*)    GFR calc non Af Amer 83 (*)    All other components within normal limits  TROPONIN I  CBC  APTT  PROTIME-INR    Imaging Review Dg Chest 2 View  11/04/2014   CLINICAL DATA:  3 hr history of facial numbness. Weakness and sweating. History of prior myocardial infarct.  EXAM: CHEST  2 VIEW  COMPARISON:  October 27, 2012  FINDINGS: There is no edema or consolidation. Heart size and pulmonary vascularity are normal. No adenopathy. No bone lesions. Mild anterior eventration of the right hemidiaphragm is stable.  IMPRESSION: No edema or consolidation.   Electronically Signed   By: Lowella Grip M.D.   On: 11/04/2014 13:34     EKG Interpretation   Date/Time:  Monday November 04 2014 12:56:55 EST Ventricular Rate:  55 PR Interval:  174 QRS Duration: 86 QT Interval:  424 QTC Calculation: 405 R Axis:   106 Text Interpretation:  Sinus bradycardia Rightward axis Borderline ECG  since last tracing no significant  change Confirmed by Ayris Carano  MD, Stephen Turnbaugh  [phone]) on 11/04/2014 1:01:13 PM      MDM   Final diagnoses:  Chest pain    The patient has a normal cardiovascular exam, he does feel slightly tacky on the skin, vital signs are normal but his symptoms are concerning for recurrent anginal pains. The EKG does not reveal acute myocardial infarction. We'll check a troponin, give full dose of aspirin and anticipate consultation with cardiology.  Discussed case with Dr. Dorris Carnes who agrees that pt should be admitted to hospital for further evaluation.  He has neg trop, neg ECG, ongoing mild sx.    Will admit to [facility] overnight  Meds given in ED:  Medications  aspirin chewable tablet 324 mg (324 mg Oral Given 11/04/14 1303)  gi cocktail (Maalox,Lidocaine,Donnatal) (30 mLs Oral Given 11/04/14 1354)      Johnna Acosta, MD 11/04/14 [phone]

## 2014-11-04 NOTE — ED Notes (Signed)
Pt reports face, neck and jaw tingling "like pins and needles" last night, "intense", pt states tingling resolved over night while he slept. This morning, felt "shaky and tired" so decided to come in to get checked out. Some nausea this am.

## 2014-11-04 NOTE — ED Notes (Signed)
Pt states he took one sl ntg last night, with no relief.

## 2014-11-05 ENCOUNTER — Observation Stay (HOSPITAL_COMMUNITY): Payer: 59

## 2014-11-05 DIAGNOSIS — I2510 Atherosclerotic heart disease of native coronary artery without angina pectoris: Secondary | ICD-10-CM

## 2014-11-05 DIAGNOSIS — E119 Type 2 diabetes mellitus without complications: Secondary | ICD-10-CM | POA: Diagnosis not present

## 2014-11-05 DIAGNOSIS — I25119 Atherosclerotic heart disease of native coronary artery with unspecified angina pectoris: Secondary | ICD-10-CM | POA: Diagnosis not present

## 2014-11-05 DIAGNOSIS — R200 Anesthesia of skin: Secondary | ICD-10-CM | POA: Diagnosis not present

## 2014-11-05 DIAGNOSIS — R0789 Other chest pain: Secondary | ICD-10-CM

## 2014-11-05 DIAGNOSIS — I209 Angina pectoris, unspecified: Secondary | ICD-10-CM

## 2014-11-05 DIAGNOSIS — R5383 Other fatigue: Secondary | ICD-10-CM | POA: Diagnosis not present

## 2014-11-05 LAB — TROPONIN I: Troponin I: <0.03 ng/mL (ref <0.031)

## 2014-11-05 LAB — LIPID PANEL
CHOL/HDL RATIO: 2.7 ratio
Cholesterol: 107 mg/dL (ref 0–200)
HDL: 39 mg/dL — ABNORMAL LOW (ref >39)
LDL Cholesterol: 50 mg/dL (ref 0–99)
Triglycerides: 92 mg/dL (ref <150)
VLDL: 18 mg/dL (ref 0–40)

## 2014-11-05 LAB — BASIC METABOLIC PANEL
ANION GAP: 8 (ref 5–15)
BUN: 14 mg/dL (ref 6–23)
CALCIUM: 9.8 mg/dL (ref 8.4–10.5)
CHLORIDE: 107 mmol/L (ref 96–112)
CO2: 28 mmol/L (ref 19–32)
Creatinine, Ser: 1.27 mg/dL (ref 0.50–1.35)
GFR calc non Af Amer: 65 mL/min — ABNORMAL LOW (ref >90)
GFR, EST AFRICAN AMERICAN: 76 mL/min — AB (ref >90)
Glucose, Bld: 134 mg/dL — ABNORMAL HIGH (ref 70–99)
POTASSIUM: 4.7 mmol/L (ref 3.5–5.1)
SODIUM: 143 mmol/L (ref 135–145)

## 2014-11-05 LAB — CBC
HCT: 44 % (ref 39.0–52.0)
Hemoglobin: 14.8 g/dL (ref 13.0–17.0)
MCH: 31 pg (ref 26.0–34.0)
MCHC: 33.6 g/dL (ref 30.0–36.0)
MCV: 92.2 fL (ref 78.0–100.0)
Platelets: 235 10*3/uL (ref 150–400)
RBC: 4.77 MIL/uL (ref 4.22–5.81)
RDW: 12.9 % (ref 11.5–15.5)
WBC: 5 10*3/uL (ref 4.0–10.5)

## 2014-11-05 LAB — GLUCOSE, CAPILLARY
Glucose-Capillary: 127 mg/dL — ABNORMAL HIGH (ref 70–99)
Glucose-Capillary: 174 mg/dL — ABNORMAL HIGH (ref 70–99)

## 2014-11-05 LAB — CBG MONITORING, ED: GLUCOSE-CAPILLARY: 128 mg/dL — AB (ref 70–99)

## 2014-11-05 LAB — PROTIME-INR
INR: 0.98 (ref 0.00–1.49)
Prothrombin Time: 13.1 seconds (ref 11.6–15.2)

## 2014-11-05 LAB — TSH: TSH: 3.247 u[IU]/mL (ref 0.350–4.500)

## 2014-11-05 MED ORDER — TECHNETIUM TC 99M SESTAMIBI GENERIC - CARDIOLITE
10.0000 | Freq: Once | INTRAVENOUS | Status: AC | PRN
  Administered 2014-11-05: 10 via INTRAVENOUS

## 2014-11-05 MED ORDER — TECHNETIUM TC 99M SESTAMIBI GENERIC - CARDIOLITE
30.0000 | Freq: Once | INTRAVENOUS | Status: AC | PRN
  Administered 2014-11-05: 30 via INTRAVENOUS

## 2014-11-05 MED ORDER — ACETAMINOPHEN 325 MG PO TABS: 650.0000 mg | ORAL_TABLET | ORAL | Status: DC | PRN

## 2014-11-05 NOTE — Progress Notes (Signed)
  Echocardiogram 2D Echocardiogram has been performed.  William Mckenzie 11/05/2014, 2:02 PM

## 2014-11-05 NOTE — Progress Notes (Signed)
UR completed 

## 2014-11-05 NOTE — Discharge Summary (Signed)
Patient ID: William Mckenzie,  MRN: [id number], DOB/AGE: [birth year] 49 y.o.  Admit date: 11/04/2014 Discharge date: 11/05/2014  Primary Care Provider: Kathlene November, MD Primary Cardiologist: Dr Burt Knack  Discharge Diagnoses Principal Problem:   Angina pectoris, variant Active Problems:   Numbness of face   CAD S/P PCI 2010   DM II (diabetes mellitus, type II), controlled   Hyperlipidemia    Procedures: Exercise Myoview 11/05/14   Hospital Course:       49 yo who . He has a history of CAD, HL, HTN and DM He initially presented with an inferior wall MI in 2010(N/V, back pain) He was treated with stenting of the PDA branch of the right coronary artery with a DES. He took dual antiplatelet therapy for one year and has remained on aspirin. He had nonobstructive disease of the left coronary artery and LCx and preserved left ventricular function. On the night of admission,  after watching the [employer] Bowl, around 10:35pm the patient developed tingling of the face and neck on both sides that got very intense but no CP. He took a NTG around 11pm with no improvement in the tingling of his face. He got very panicked. He went to bed around 1:30am and still had the tingling and awakened at 2:30am and it was still present. He went back to sleep and when he awakened at 5am it was gone but he felt very weak and he was jittery. He went on to work in [place] and while driving to work he felt nauseated with some mild back pain that was very similar to the discomfort he had with his MI. He says that the nausea and back pain lasted about 45 minutes. He felt weak, diaphoretic and jittery after getting to work in [place].          He presented to the ED for further evaluation 11/04/14 for further evaluation. He was admitted to telemetry and ruled out for an MI. Troponin negative. Echo showed normal LVF and Myoview was low risk with scar but  no ischemia.    Discharge Vitals:  Blood pressure 175/84,  pulse 78, temperature 98 F (36.7 C), temperature source Oral, resp. rate 14, height 6' (1.829 m), weight 236 lb 15.9 oz (107.5 kg), SpO2 97 %.    Labs: Results for orders placed or performed during the hospital encounter of 11/04/14 (from the past 24 hour(s))  MRSA PCR Screening     Status: None   Collection Time: 11/04/14  7:11 PM  Result Value Ref Range   MRSA by PCR NEGATIVE NEGATIVE  Magnesium     Status: None   Collection Time: 11/04/14  9:20 PM  Result Value Ref Range   Magnesium 2.1 1.5 - 2.5 mg/dL  Comprehensive metabolic panel     Status: Abnormal   Collection Time: 11/04/14  9:20 PM  Result Value Ref Range   Sodium 140 135 - 145 mmol/L   Potassium 3.7 3.5 - 5.1 mmol/L   Chloride 106 96 - 112 mmol/L   CO2 30 19 - 32 mmol/L   Glucose, Bld 133 (H) 70 - 99 mg/dL   BUN 16 6 - 23 mg/dL   Creatinine, Ser 1.29 0.50 - 1.35 mg/dL   Calcium 9.0 8.4 - 10.5 mg/dL   Total Protein 6.2 6.0 - 8.3 g/dL   Albumin 3.9 3.5 - 5.2 g/dL   AST 25 0 - 37 U/L   ALT 32 0 - 53 U/L   Alkaline Phosphatase  61 39 - 117 U/L   Total Bilirubin 0.5 0.3 - 1.2 mg/dL   GFR calc non Af Amer 64 (L) >90 mL/min   GFR calc Af Amer 74 (L) >90 mL/min   Anion gap 4 (L) 5 - 15  TSH     Status: None   Collection Time: 11/04/14  9:20 PM  Result Value Ref Range   TSH 3.247 0.350 - 4.500 uIU/mL  Troponin I-(serum)     Status: None   Collection Time: 11/04/14  9:20 PM  Result Value Ref Range   Troponin I <0.03 <0.031 ng/mL  CBC WITH DIFFERENTIAL     Status: Abnormal   Collection Time: 11/04/14  9:20 PM  Result Value Ref Range   WBC 6.1 4.0 - 10.5 K/uL   RBC 4.55 4.22 - 5.81 MIL/uL   Hemoglobin 13.9 13.0 - 17.0 g/dL   HCT 40.8 39.0 - 52.0 %   MCV 89.7 78.0 - 100.0 fL   MCH 30.5 26.0 - 34.0 pg   MCHC 34.1 30.0 - 36.0 g/dL   RDW 12.7 11.5 - 15.5 %   Platelets 235 150 - 400 K/uL   Neutrophils Relative % 44 43 - 77 %   Neutro Abs 2.7 1.7 - 7.7 K/uL   Lymphocytes Relative 49 (H) 12 - 46 %   Lymphs Abs 3.0  0.7 - 4.0 K/uL   Monocytes Relative 6 3 - 12 %   Monocytes Absolute 0.4 0.1 - 1.0 K/uL   Eosinophils Relative 1 0 - 5 %   Eosinophils Absolute 0.1 0.0 - 0.7 K/uL   Basophils Relative 0 0 - 1 %   Basophils Absolute 0.0 0.0 - 0.1 K/uL  Protime-INR     Status: None   Collection Time: 11/04/14  9:20 PM  Result Value Ref Range   Prothrombin Time 13.4 11.6 - 15.2 seconds   INR 1.01 0.00 - 1.49  APTT     Status: None   Collection Time: 11/04/14  9:20 PM  Result Value Ref Range   aPTT 29 24 - 37 seconds  Glucose, capillary     Status: Abnormal   Collection Time: 11/04/14  9:58 PM  Result Value Ref Range   Glucose-Capillary 127 (H) 70 - 99 mg/dL  Troponin I-(serum)     Status: None   Collection Time: 11/05/14  3:04 AM  Result Value Ref Range   Troponin I <0.03 <0.031 ng/mL  Lipid panel     Status: Abnormal   Collection Time: 11/05/14  3:04 AM  Result Value Ref Range   Cholesterol 107 0 - 200 mg/dL   Triglycerides 92 <150 mg/dL   HDL 39 (L) >39 mg/dL   Total CHOL/HDL Ratio 2.7 RATIO   VLDL 18 0 - 40 mg/dL   LDL Cholesterol 50 0 - 99 mg/dL  Troponin I-(serum)     Status: None   Collection Time: 11/05/14  8:12 AM  Result Value Ref Range   Troponin I <0.03 <0.031 ng/mL  Basic metabolic panel     Status: Abnormal   Collection Time: 11/05/14  8:12 AM  Result Value Ref Range   Sodium 143 135 - 145 mmol/L   Potassium 4.7 3.5 - 5.1 mmol/L   Chloride 107 96 - 112 mmol/L   CO2 28 19 - 32 mmol/L   Glucose, Bld 134 (H) 70 - 99 mg/dL   BUN 14 6 - 23 mg/dL   Creatinine, Ser 1.27 0.50 - 1.35 mg/dL   Calcium 9.8 8.4 -  10.5 mg/dL   GFR calc non Af Amer 65 (L) >90 mL/min   GFR calc Af Amer 76 (L) >90 mL/min   Anion gap 8 5 - 15  CBC     Status: None   Collection Time: 11/05/14  8:12 AM  Result Value Ref Range   WBC 5.0 4.0 - 10.5 K/uL   RBC 4.77 4.22 - 5.81 MIL/uL   Hemoglobin 14.8 13.0 - 17.0 g/dL   HCT 44.0 39.0 - 52.0 %   MCV 92.2 78.0 - 100.0 fL   MCH 31.0 26.0 - 34.0 pg   MCHC  33.6 30.0 - 36.0 g/dL   RDW 12.9 11.5 - 15.5 %   Platelets 235 150 - 400 K/uL  Protime-INR     Status: None   Collection Time: 11/05/14  8:12 AM  Result Value Ref Range   Prothrombin Time 13.1 11.6 - 15.2 seconds   INR 0.98 0.00 - 1.49  Glucose, capillary     Status: Abnormal   Collection Time: 11/05/14  4:20 PM  Result Value Ref Range   Glucose-Capillary 174 (H) 70 - 99 mg/dL    Disposition:      Follow-up Information    Follow up with Sherren Mocha, MD.   Specialty:  Cardiology   Why:  office will contact you   Contact information:   [address] [place] [address] [phone]       Discharge Medications:    Medication List    TAKE these medications        acetaminophen 325 MG tablet  Commonly known as:  TYLENOL  Take 2 tablets (650 mg total) by mouth every 4 (four) hours as needed for headache or mild pain.     aspirin 81 MG tablet  Take 1 tablet (81 mg total) by mouth daily.     atorvastatin 40 MG tablet  Commonly known as:  LIPITOR  Take 1 tablet (40 mg total) by mouth daily.     buPROPion 150 MG 12 hr tablet  Commonly known as:  WELLBUTRIN SR  Take 1 tablet by mouth twice a day.     glucose blood test strip  Commonly known as:  ONE TOUCH ULTRA TEST  Use as instructed     metFORMIN 850 MG tablet  Commonly known as:  GLUCOPHAGE  Take 1 tablet (850mg  total) by mouth 2 (two) times daily with meal     metoprolol succinate 25 MG 24 hr tablet  Commonly known as:  TOPROL-XL  TAKE 1 TABLET (25 MG TOTAL) BY MOUTH DAILY.     nitroGLYCERIN 0.4 MG SL tablet  Commonly known as:  NITROSTAT  Place 1 tablet (0.4 mg total) under the tongue every 5 (five) minutes as needed.     ONE TOUCH LANCETS Misc  Check once daily.         Duration of Discharge Encounter: Greater than 30 minutes including physician time.  Angelena Form PA-C 11/05/2014 6:19 PM

## 2014-11-05 NOTE — Discharge Instructions (Signed)
Acute Coronary Syndrome  Acute coronary syndrome (ACS) is an urgent problem in which the blood and oxygen supply to the heart is critically deficient. ACS requires hospitalization because one or more coronary arteries may be blocked.  ACS represents a range of conditions including:  · Previous angina that is now unstable, lasts longer, happens at rest, or is more intense.  · A heart attack, with heart muscle cell injury and death.  There are three vital coronary arteries that supply the heart muscle with blood and oxygen so that it can pump blood effectively. If blockages to these arteries develop, blood flow to the heart muscle is reduced. If the heart does not get enough blood, angina may occur as the first warning sign.  SYMPTOMS   · The most common signs of angina include:  ¨ Tightness or squeezing in the chest.  ¨ Feeling of heaviness on the chest.  ¨ Discomfort in the arms, neck, back, or jaw.  ¨ Shortness of breath and nausea.  ¨ Cold, wet skin.  · Angina is usually brought on by physical effort or excitement which increase the oxygen needs of the heart. These states increase the blood flow needs of the heart beyond what can be delivered.  · Other symptoms that are not as common include:  ¨ Fatigue  ¨ Unexplained feelings of nervousness or anxiety  ¨ Weakness  ¨ Diarrhea  · Sometimes, you may not have noticed any symptoms at all but still suffered a cardiac injury.  TREATMENT   · Medicines to help discomfort may include nitroglycerin (nitro) in the form of tablets or a spray for rapid relief, or longer-acting forms such as cream, patches, or capsules. (Be aware that there are many side effects and possible interactions with other drugs).  · Other medicines may be used to help the heart pump better.  · Procedures to open blocked arteries including angioplasty or stent placement to keep the arteries open.  · Open heart surgery may be needed when there are many blockages or they are in critical locations that  are best treated with surgery.  HOME CARE INSTRUCTIONS   · Do not use any tobacco products including cigarettes, chewing tobacco, or electronic cigarettes.  · Take one baby or adult aspirin daily, if your health care provider advises. This helps reduce the risk of a heart attack.  · It is very important that you follow the angina treatment prescribed by your health care provider. Make arrangements for proper follow-up care.  · Eat a heart healthy diet with salt and fat restrictions as advised.  · Regular exercise is good for you as long as it does not cause discomfort. Do not begin any new type of exercise until you check with your health care provider.  · If you are overweight, you should lose weight.  · Try to maintain normal blood lipid levels.  · Keep your blood pressure under control as recommended by your health care provider.  · You should tell your health care provider right away about any increase in the severity or frequency of your chest discomfort or angina attacks. When you have angina, you should stop what you are doing and sit down. This may bring relief in 3 to 5 minutes. If your health care provider has prescribed nitro, take it as directed.  · If your health care provider has given you a follow-up appointment, it is very important to keep that appointment. Not keeping the appointment could result in a chronic or   permanent injury, pain, and disability. If there is any problem keeping the appointment, you must call back to this facility for assistance.  SEEK IMMEDIATE MEDICAL CARE IF:   · You develop nausea, vomiting, or shortness of breath.  · You feel faint, lightheaded, or pass out.  · Your chest discomfort gets worse.  · You are sweating or experience sudden profound fatigue.  · You do not get relief of your chest pain after 3 doses of nitro.  · Your discomfort lasts longer than 15 minutes.  MAKE SURE YOU:   · Understand these instructions.  · Will watch your condition.  · Will get help right  away if you are not doing well or get worse.  · Take all medicines as directed by your health care provider.  Document Released: 09/13/2005 Document Revised: 09/18/2013 Document Reviewed: 01/15/2014  Exit[employer]® Patient Information ©2015 [employer]. This information is not intended to replace advice given to you by your health care provider. Make sure you discuss any questions you have with your health care provider.

## 2014-11-05 NOTE — Progress Notes (Signed)
TELEMETRY: Reviewed telemetry pt in NSR: Filed Vitals:   11/04/14 2000 11/04/14 2012 11/05/14 0000 11/05/14 0455  BP: 119/65  121/76 109/71  Pulse:    56  Temp:   98.2 F (36.8 C) 97.8 F (36.6 C)  TempSrc:   Oral Oral  Resp: 13  18 16   Height:      Weight:      SpO2: 97% 98% 93% 96%    Intake/Output Summary (Last 24 hours) at 11/05/14 0715 Last data filed at 11/04/14 2225  Gross per 24 hour  Intake    220 ml  Output    200 ml  Net     20 ml   Filed Weights   11/04/14 1907  Weight: 236 lb 15.9 oz (107.5 kg)    Subjective No complaints this am. Denies CP, SOB, facial tingling, or back pain.  Marland Kitchen aspirin EC  81 mg Oral Daily  . atorvastatin  40 mg Oral Daily  . buPROPion  150 mg Oral BID  . heparin  5,000 Units Subcutaneous 3 times per day  . metFORMIN  850 mg Oral BID WC  . metoprolol succinate  25 mg Oral Daily  . sodium chloride  3 mL Intravenous Q12H      LABS: Basic Metabolic Panel:  Recent Labs  11/04/14 1220 11/04/14 2120  NA 137 140  K 4.0 3.7  CL 107 106  CO2 24 30  GLUCOSE 127* 133*  BUN 15 16  CREATININE 1.04 1.29  CALCIUM 9.2 9.0  MG  --  2.1   Liver Function Tests:  Recent Labs  11/04/14 2120  AST 25  ALT 32  ALKPHOS 61  BILITOT 0.5  PROT 6.2  ALBUMIN 3.9   No results for input(s): LIPASE, AMYLASE in the last 72 hours. CBC:  Recent Labs  11/04/14 1220 11/04/14 2120  WBC 7.2 6.1  NEUTROABS  --  2.7  HGB 14.9 13.9  HCT 44.2 40.8  MCV 90.6 89.7  PLT 254 235   Cardiac Enzymes:  Recent Labs  11/04/14 1220 11/04/14 2120 11/05/14 0304  TROPONINI <0.03 <0.03 <0.03   BNP: No results for input(s): PROBNP in the last 72 hours. D-Dimer: No results for input(s): DDIMER in the last 72 hours. Hemoglobin A1C: No results for input(s): HGBA1C in the last 72 hours. Fasting Lipid Panel:  Recent Labs  11/05/14 0304  CHOL 107  HDL 39*  LDLCALC 50  TRIG 92  CHOLHDL 2.7   Thyroid Function Tests:  Recent Labs  11/04/14 2120  TSH 3.247     Radiology/Studies:  Dg Chest 2 View  11/04/2014   CLINICAL DATA:  3 hr history of facial numbness. Weakness and sweating. History of prior myocardial infarct.  EXAM: CHEST  2 VIEW  COMPARISON:  October 27, 2012  FINDINGS: There is no edema or consolidation. Heart size and pulmonary vascularity are normal. No adenopathy. No bone lesions. Mild anterior eventration of the right hemidiaphragm is stable.  IMPRESSION: No edema or consolidation.   Electronically Signed   By: Lowella Grip M.D.   On: 11/04/2014 13:34   Ct Head Wo Contrast  11/04/2014   CLINICAL DATA:  Face, neck, and jaw tingling last night. This morning felt shaky and tired. Nausea.  EXAM: CT HEAD WITHOUT CONTRAST  TECHNIQUE: Contiguous axial images were obtained from the base of the skull through the vertex without intravenous contrast.  COMPARISON:  09/10/2007  FINDINGS: Ventricles and sulci appear symmetrical. No mass effect or midline shift.  No abnormal extra-axial fluid collections. Gray-white matter junctions are distinct. Basal cisterns are not effaced. No evidence of acute intracranial hemorrhage. No depressed skull fractures. Visualized paranasal sinuses and mastoid air cells are not opacified.  IMPRESSION: No acute intracranial abnormalities.  Normal examination.   Electronically Signed   By: Lucienne Capers M.D.   On: 11/04/2014 22:20    PHYSICAL EXAM General: Well developed, well nourished, in no acute distress. Head: Normocephalic, atraumatic, sclera non-icteric, oropharynx is clear Neck: Negative for carotid bruits. JVD not elevated. No adenopathy Lungs: Clear bilaterally to auscultation without wheezes, rales, or rhonchi. Breathing is unlabored. Heart: RRR S1 S2 without murmurs, rubs, or gallops.  Abdomen: Soft, non-tender, non-distended with normoactive bowel sounds. No hepatomegaly. No rebound/guarding. No obvious abdominal masses. Msk:  Strength and tone appears normal for  age. Extremities: No clubbing, cyanosis or edema.  Distal pedal pulses are 2+ and equal bilaterally. Neuro: Alert and oriented X 3. Moves all extremities spontaneously. Psych:  Responds to questions appropriately with a normal affect.  ASSESSMENT AND PLAN: 1. Atypical chest pain. ? Similar to prior heart pain. Cardiac enzymes are negative. Ecg is normal. Plan stress Myoview today. Check Echo. If studies are normal plan DC later today. 2. Facial tingling. Resolved. Head CT normal. 3. Hyperlipidemia. Well controlled on lipitor.  4. DM type 2- on metformin.  Present on Admission:  . Hyperlipidemia . CAD, NATIVE VESSEL . Angina pectoris, variant  Signed, Peter Martinique, [place] 11/05/2014 7:15 AM

## 2014-11-06 LAB — GLUCOSE, CAPILLARY: Glucose-Capillary: 112 mg/dL — ABNORMAL HIGH (ref 70–99)

## 2014-11-25 ENCOUNTER — Encounter: Payer: 59 | Admitting: Physician Assistant

## 2014-11-26 HISTORY — PX: KNEE SURGERY: SHX244

## 2014-12-05 ENCOUNTER — Encounter: Payer: Self-pay | Admitting: Physician Assistant

## 2014-12-05 ENCOUNTER — Other Ambulatory Visit: Payer: Self-pay

## 2014-12-05 ENCOUNTER — Ambulatory Visit (INDEPENDENT_AMBULATORY_CARE_PROVIDER_SITE_OTHER): Payer: 59 | Admitting: Physician Assistant

## 2014-12-05 VITALS — BP 116/78 | HR 76 | Ht 72.0 in | Wt 246.4 lb

## 2014-12-05 DIAGNOSIS — I2510 Atherosclerotic heart disease of native coronary artery without angina pectoris: Secondary | ICD-10-CM

## 2014-12-05 DIAGNOSIS — R202 Paresthesia of skin: Secondary | ICD-10-CM

## 2014-12-05 DIAGNOSIS — I10 Essential (primary) hypertension: Secondary | ICD-10-CM

## 2014-12-05 DIAGNOSIS — E785 Hyperlipidemia, unspecified: Secondary | ICD-10-CM

## 2014-12-05 DIAGNOSIS — I25119 Atherosclerotic heart disease of native coronary artery with unspecified angina pectoris: Secondary | ICD-10-CM

## 2014-12-05 MED ORDER — METOPROLOL SUCCINATE ER 25 MG PO TB24: ORAL_TABLET | ORAL | Status: DC

## 2014-12-05 MED ORDER — NITROGLYCERIN 0.4 MG SL SUBL: 0.4000 mg | SUBLINGUAL_TABLET | SUBLINGUAL | Status: DC | PRN

## 2014-12-05 NOTE — Patient Instructions (Addendum)
Your physician wants you to follow-up in: [place]. You will receive a reminder letter in the mail two months in advance. If you don't receive a letter, please call our office to schedule the follow-up appointment.  Your physician recommends that you continue on your current medications as directed. Please refer to the Current Medication list given to you today.  A REFILL WAS SENT IN FOR THE NTG AND METOPROLOL

## 2014-12-05 NOTE — Progress Notes (Signed)
Cardiology Office Note   Date:  12/05/2014   ID:  [name], DOB 1966-09-09, MRN [id number]  PCP:  Kathlene November, MD  Cardiologist:  Dr. Sherren Mocha    Chief Complaint  Patient presents with  . Hospitalization Follow-up    back pain >> r/o for MI, Nuc study neg for ischemia  . Coronary Artery Disease     History of Present Illness: William Mckenzie is a 49 y.o. male with a hx of CAD status post inferior MI in 2010 treated with DES to the PDA, HTN, HL.  Admitted 2/8-2/9 with complaints of nausea, back pain and neck/facial tingling. His back discomfort reminded him of his symptoms with his myocardial infarction. He came to the emergency room. Cardiac enzymes remained normal. Myoview demonstrated prior scar but no ischemia. Study was called intermediate risk due to his EF of 44%. However, echocardiogram demonstrated normal LV function with an EF of 55-60%. No further workup was needed and medical Rx was continued. He returns for follow-up.  Since DC he has felt well.  No further paresthesias or back pain.  The patient denies chest pain, shortness of breath, syncope, orthopnea, PND or significant pedal edema.    Studies/Reports Reviewed Today:  Myoview 11/05/14 IMPRESSION: 1. Moderate sized, medium intensity fixed defect in the inferior wall consistent with diaphragmatic attenuation. No reversible ischemia or infarction. 2. Mildly reduced left ventricular wall motion. 3. Left ventricular ejection fraction 44% 4. Intermediate-risk stress test findings*.  Echo 11/05/14 - EF 55-60%. Wall motion was normal - Right ventricle: The cavity size was normal. Systolic function was normal.  Cardiac Cath/PCI 02/2009 LAD:  prox 40%, mid 40% LCx:  Mid 40% RCA:  PCA 100% >> PCI:  2.5- x 85mm Xience DES EF:  55% with inf-apical AK   Past Medical History  Diagnosis Date  . CAD (coronary artery disease)     prior inferior MI in 2010 treated with stenting of the right PDA;  had nonobstructive disease in the LAD and LCX with approximately 40% lesions. EF normal  . Hyperlipidemia   . Hypertension     white coat   . Anxiety   . Obesity   . Diabetes mellitus without complication   . Myocardial infarction     Past Surgical History  Procedure Laterality Date  . Knee surgery  12-2010    LEFT, meniscus . arthroscopy  . Cardiac catheterization    . Coronary angioplasty       Current Outpatient Prescriptions  Medication Sig Dispense Refill  . acetaminophen (TYLENOL) 325 MG tablet Take 2 tablets (650 mg total) by mouth every 4 (four) hours as needed for headache or mild pain.    Marland Kitchen aspirin 81 MG tablet Take 1 tablet (81 mg total) by mouth daily.    Marland Kitchen atorvastatin (LIPITOR) 40 MG tablet Take 1 tablet (40 mg total) by mouth daily. 90 tablet 2  . buPROPion (WELLBUTRIN SR) 150 MG 12 hr tablet Take 1 tablet by mouth twice a day. 180 tablet 2  . glucose blood (ONE TOUCH ULTRA TEST) test strip Use as instructed 100 each 12  . metFORMIN (GLUCOPHAGE) 850 MG tablet Take 1 tablet (850mg  total) by mouth 2 (two) times daily with meal 180 tablet 2  . metoprolol succinate (TOPROL-XL) 25 MG 24 hr tablet TAKE 1 TABLET (25 MG TOTAL) BY MOUTH DAILY. 90 tablet 2  . nitroGLYCERIN (NITROSTAT) 0.4 MG SL tablet Place 1 tablet (0.4 mg total) under the tongue every 5 (five)  minutes as needed. (Patient taking differently: Place 0.4 mg under the tongue every 5 (five) minutes as needed for chest pain (MAX 3 TABLETS). ) 25 tablet 2  . ONE TOUCH LANCETS MISC Check once daily. 200 each 3   No current facility-administered medications for this visit.    Allergies:   Review of patient's allergies indicates no known allergies.    Social History:  The patient  reports that he has never smoked. He has quit using smokeless tobacco. He reports that he drinks alcohol. He reports that he does not use illicit drugs.   Family History:  The patient's family history includes CAD in his other;  Hypertension in his mother. There is no history of Colon cancer, Prostate cancer, or Diabetes.    ROS:   Please see the history of present illness.   Review of Systems  Musculoskeletal: Positive for back pain, joint pain and joint swelling.       L knee meniscus tear  Gastrointestinal: Positive for nausea and vomiting.  All other systems reviewed and are negative.    PHYSICAL EXAM: VS:  BP 116/78 mmHg  Pulse 76  Ht 6' (1.829 m)  Wt 246 lb 6.4 oz (111.766 kg)  BMI 33.41 kg/m2    Wt Readings from Last 3 Encounters:  12/05/14 246 lb 6.4 oz (111.766 kg)  11/04/14 236 lb 15.9 oz (107.5 kg)  08/12/14 227 lb 6 oz (103.137 kg)     GEN: Well nourished, well developed, in no acute distress HEENT: normal Neck: no JVD, no carotid bruits, no masses Cardiac:  Normal S1/S2, RRR; no murmur ,  no rubs or gallops, no edema  Respiratory:  clear to auscultation bilaterally, no wheezing, rhonchi or rales. GI: soft, nontender, nondistended, + BS MS: no deformity or atrophy Skin: warm and dry  Neuro:  CNs II-XII intact, Strength and sensation are intact Psych: Normal affect   EKG:  EKG is ordered today.  It demonstrates:   NSR, HR 76, normal axis, no ST changes   Recent Labs: 11/04/2014: ALT 32; Magnesium 2.1; TSH 3.247 11/05/2014: BUN 14; Creatinine 1.27; Hemoglobin 14.8; Platelets 235; Potassium 4.7; Sodium 143    Lipid Panel    Component Value Date/Time   CHOL 107 11/05/2014 0304   TRIG 92 11/05/2014 0304   HDL 39* 11/05/2014 0304   CHOLHDL 2.7 11/05/2014 0304   VLDL 18 11/05/2014 0304   LDLCALC 50 11/05/2014 0304   LDLDIRECT 179.4 11/21/2007 1156    Recent Labs  11/04/14 1220 11/04/14 2120 11/05/14 0304 11/05/14 0812  TROPONINI <0.03 <0.03 <0.03 <0.03    ASSESSMENT AND PLAN:  Paresthesias:  Resolved.  Head CT in the hospital was unremarkable.  No symptoms to suggest TIA.  I suspect his symptoms are explained by positional compression of peripheral nerves and indigestion.   No further workup.    Coronary artery disease involving native coronary artery of native heart without angina pectoris:  No angina.  Continue ASA, statin, beta-blocker.   Hyperlipidemia:  LDL optimal by labs in 2/16.  Continue statin.   Essential hypertension:  Controlled.    Current medicines are reviewed at length with the patient today.  The patient does not have concerns regarding medicines.  The following changes have been made:  no change  Labs/ tests ordered today include:   Orders Placed This Encounter  Procedures  . EKG 12-Lead    Disposition:   FU with Dr. Sherren Mocha 12 mos.   Signed, Richardson Dopp, PA-C, MHS  12/05/2014 9:02 AM    [facility] [address], [place], [place]  [address] Phone: [phone]; Fax: [phone]

## 2014-12-05 NOTE — Addendum Note (Signed)
Addended by: Michae Kava on: 12/05/2014 09:16 AM   Modules accepted: Orders

## 2015-03-03 ENCOUNTER — Telehealth: Payer: Self-pay | Admitting: Internal Medicine

## 2015-03-03 NOTE — Telephone Encounter (Signed)
Pre Visit letter sent  °

## 2015-03-05 ENCOUNTER — Telehealth: Payer: Self-pay | Admitting: Internal Medicine

## 2015-03-05 NOTE — Telephone Encounter (Signed)
Pre Visit letter sent  °

## 2015-03-17 ENCOUNTER — Encounter: Payer: Self-pay | Admitting: Internal Medicine

## 2015-03-17 ENCOUNTER — Ambulatory Visit (INDEPENDENT_AMBULATORY_CARE_PROVIDER_SITE_OTHER): Payer: 59 | Admitting: Internal Medicine

## 2015-03-17 VITALS — BP 128/78 | HR 79 | Temp 98.1°F | Ht 72.0 in | Wt 253.1 lb

## 2015-03-17 DIAGNOSIS — J069 Acute upper respiratory infection, unspecified: Secondary | ICD-10-CM | POA: Diagnosis not present

## 2015-03-17 MED ORDER — HYDROCODONE-HOMATROPINE 5-1.5 MG/5ML PO SYRP: 5.0000 mL | ORAL_SOLUTION | Freq: Every evening | ORAL | Status: DC | PRN

## 2015-03-17 MED ORDER — AMOXICILLIN 500 MG PO CAPS: 1000.0000 mg | ORAL_CAPSULE | Freq: Two times a day (BID) | ORAL | Status: DC

## 2015-03-17 NOTE — Patient Instructions (Addendum)
Rest, fluids , tylenol  Take Mucinex DM twice a day as needed   You ay also like to try one of the other OTC medicines you alrady have. If the cough is severe at night, take the hydrocodone syrup i'm prescribing.Will cause drowsiness.  If nasal  congestion use OTC Nasocort or Flonase : 2 nasal sprays on each side of the nose daily until you feel better   Take the antibiotic as prescribed  (Amoxicillin) only if no better in few days   Call if not gradually better over the next  10 days Call anytime if the symptoms are severe   You are due for a physical, please schedule it

## 2015-03-17 NOTE — Progress Notes (Signed)
Subjective:    Patient ID: William Mckenzie, male    DOB: 10-15-1965, 49 y.o.   MRN: [id number]  DOS:  03/17/2015 Type of visit - description : acute visit Interval history:  Patient is a 49 year old male with a history of DM, HLD, and anxiety in today c/o three weeks of a dry cough which transitioned into a productive cough with green/gray sputum over the last two days.   Patient states that the cough is typically constant, not worsening or improving throughout the day. Patient notes two days of subjective fever, chills, rhinorrhea, sore throat, congestion, and frontal headache. Patient also notes significant muscle aches for the past two days. Patient denies any acid reflux or history of asthma. Patient denies any wheezing/shortness of breath. No nausea/vomiting.  Review of Systems  Constitutional: No fever. No chills.   HEENT: As per HPI   Respiratory: As per HPI.  Cardiovascular: No CP, no leg swelling  GI: no nausea, no vomiting, no diarrhea , no  abdominal pain.  No blood in the stools. No dysphagia, no odynophagia    GU: No dysuria, gross hematuria, difficulty urinating. No urinary urgency, no frequency.  Musculoskeletal: +General myalgia.  Neurological: No dizziness. No diplopia.   Past Medical History  Diagnosis Date  . CAD (coronary artery disease)     prior inferior MI in 2010 treated with stenting of the right PDA; had nonobstructive disease in the LAD and LCX with approximately 40% lesions. EF normal  . Hyperlipidemia   . Hypertension     white coat   . Anxiety   . Obesity   . Diabetes mellitus without complication   . Myocardial infarction   . Acute meniscal tear, medial     Left knee    Past Surgical History  Procedure Laterality Date  . Knee surgery  12-2010    LEFT, meniscus . arthroscopy  . Cardiac catheterization    . Coronary angioplasty    . Knee surgery Left 11/2014    Meniscus, medial    History   Social History  . Marital  Status: Married    Spouse Name: N/A  . Number of Children: 1  . Years of Education: N/A   Occupational History  . [employer]   . CUSTOMER SERVICE [employer]   Social History Main Topics  . Smoking status: Never Smoker   . Smokeless tobacco: Former Systems developer     Comment: quit chewing tobacco 2010   . Alcohol Use: Yes     Comment: socially   . Drug Use: No  . Sexual Activity: Not on file   Other Topics Concern  . Not on file   Social History Narrative   Lives w/ wife and daughter                   Family History  Problem Relation Age of Onset  . CAD Other     GGF GF   . Colon cancer Neg Hx   . Prostate cancer Neg Hx   . Diabetes Neg Hx   . Hypertension Mother        Medication List       This list is accurate as of: 03/17/15  1:04 PM.  Always use your most recent med list.               acetaminophen 325 MG tablet  Commonly known as:  TYLENOL  Take 2 tablets (650 mg total) by mouth every 4 (four)  hours as needed for headache or mild pain.     amoxicillin 500 MG capsule  Commonly known as:  AMOXIL  Take 2 capsules (1,000 mg total) by mouth 2 (two) times daily.     aspirin 81 MG tablet  Take 1 tablet (81 mg total) by mouth daily.     atorvastatin 40 MG tablet  Commonly known as:  LIPITOR  Take 1 tablet (40 mg total) by mouth daily.     buPROPion 150 MG 12 hr tablet  Commonly known as:  WELLBUTRIN SR  Take 1 tablet by mouth twice a day.     glucose blood test strip  Commonly known as:  ONE TOUCH ULTRA TEST  Use as instructed     HYDROcodone-homatropine 5-1.5 MG/5ML syrup  Commonly known as:  HYCODAN  Take 5 mLs by mouth at bedtime as needed for cough.     metFORMIN 850 MG tablet  Commonly known as:  GLUCOPHAGE  Take 1 tablet (850mg  total) by mouth 2 (two) times daily with meal     metoprolol succinate 25 MG 24 hr tablet  Commonly known as:  TOPROL-XL  TAKE 1 TABLET (25 MG TOTAL) BY MOUTH DAILY.     nitroGLYCERIN 0.4 MG SL tablet  Commonly known  as:  NITROSTAT  Place 1 tablet (0.4 mg total) under the tongue every 5 (five) minutes as needed.     ONE TOUCH LANCETS Misc  Check once daily.           Objective:   Physical Exam BP 128/78 mmHg  Pulse 79  Temp(Src) 98.1 F (36.7 C) (Oral)  Ht 6' (1.829 m)  Wt 253 lb 2 oz (114.817 kg)  BMI 34.32 kg/m2  SpO2 98%  General:   Well developed, well nourished . NAD.  HEENT:  Normocephalic . Face symmetric, atraumatic L and R  TM-- bulge, no red  Throat exam normal Lungs:  CTA B Normal respiratory effort, no intercostal retractions, no accessory muscle use. Heart: RRR,  no murmur.  No pretibial edema bilaterally  Skin: Not pale. Not jaundice Abdomen:  Not distended, soft, non-tender. No rebound or rigidity. No mass, organomegaly Neurologic:  alert & oriented X3.  Speech normal, gait appropriate for age and unassisted Psych--  Cognition and judgment appear intact.  Cooperative with normal attention span and concentration.  Behavior appropriate. No anxious or depressed appearing.     Assessment & Plan:   (Patient seen along with   Aletta Edouard, medical student)  URI Patient presents to clinic c/o 3 weeks of dry, nonproductive cough which transitioned into productive cough with greenish/gray sputum two days ago (Likely d/t a  URI).  Patient denies wheezing/shortness of breath. Plan: Patient instructed to rest, drink fluids, and take tylenol/Mucinex DM as needed. Nasocort/Flonase OTC prn Will rx Hycodan for cough prn. Will rx amoxicillin for patient to take if symptoms do not resolve in the next few days. Patient informed to contact us if symptoms worsen or do not improve over the next ten days.

## 2015-03-17 NOTE — Progress Notes (Signed)
Pre visit review using our clinic review tool, if applicable. No additional management support is needed unless otherwise documented below in the visit note. 

## 2015-03-24 ENCOUNTER — Encounter: Payer: Self-pay | Admitting: Internal Medicine

## 2015-03-24 ENCOUNTER — Ambulatory Visit (INDEPENDENT_AMBULATORY_CARE_PROVIDER_SITE_OTHER): Payer: 59 | Admitting: Internal Medicine

## 2015-03-24 VITALS — BP 116/64 | HR 68 | Temp 98.3°F | Ht 72.0 in | Wt 252.2 lb

## 2015-03-24 DIAGNOSIS — Z0000 Encounter for general adult medical examination without abnormal findings: Secondary | ICD-10-CM

## 2015-03-24 DIAGNOSIS — Z23 Encounter for immunization: Secondary | ICD-10-CM | POA: Diagnosis not present

## 2015-03-24 LAB — TSH: TSH: 0.94 u[IU]/mL (ref 0.35–4.50)

## 2015-03-24 LAB — MICROALBUMIN / CREATININE URINE RATIO
Creatinine,U: 233.1 mg/dL
MICROALB/CREAT RATIO: 0.3 mg/g (ref 0.0–30.0)
Microalb, Ur: 0.8 mg/dL (ref 0.0–1.9)

## 2015-03-24 LAB — BASIC METABOLIC PANEL WITH GFR
BUN: 11 mg/dL (ref 6–23)
CO2: 28 meq/L (ref 19–32)
Calcium: 9.1 mg/dL (ref 8.4–10.5)
Chloride: 107 meq/L (ref 96–112)
Creatinine, Ser: 1.03 mg/dL (ref 0.40–1.50)
GFR: 81.54 mL/min
Glucose, Bld: 142 mg/dL — ABNORMAL HIGH (ref 70–99)
Potassium: 4.6 meq/L (ref 3.5–5.1)
Sodium: 140 meq/L (ref 135–145)

## 2015-03-24 LAB — HIV ANTIBODY (ROUTINE TESTING W REFLEX): HIV: NONREACTIVE

## 2015-03-24 LAB — LIPID PANEL
Cholesterol: 125 mg/dL (ref 0–200)
HDL: 43.5 mg/dL
LDL Cholesterol: 63 mg/dL (ref 0–99)
NonHDL: 81.5
Total CHOL/HDL Ratio: 3
Triglycerides: 94 mg/dL (ref 0.0–149.0)
VLDL: 18.8 mg/dL (ref 0.0–40.0)

## 2015-03-24 LAB — HM DIABETES FOOT EXAM: HM Diabetic Foot Exam: NORMAL

## 2015-03-24 LAB — HEMOGLOBIN A1C: Hgb A1c MFr Bld: 6.8 % — ABNORMAL HIGH (ref 4.6–6.5)

## 2015-03-24 MED ORDER — ATORVASTATIN CALCIUM 40 MG PO TABS: 40.0000 mg | ORAL_TABLET | Freq: Every day | ORAL | Status: DC

## 2015-03-24 MED ORDER — BUPROPION HCL ER (SR) 150 MG PO TB12: 150.0000 mg | ORAL_TABLET | Freq: Two times a day (BID) | ORAL | Status: DC

## 2015-03-24 MED ORDER — METFORMIN HCL 850 MG PO TABS: 850.0000 mg | ORAL_TABLET | Freq: Two times a day (BID) | ORAL | Status: DC

## 2015-03-24 NOTE — Patient Instructions (Signed)
Get your blood work before you leave    

## 2015-03-24 NOTE — Assessment & Plan Note (Addendum)
Tdap 2009 pnm 23 shot 2015 Prevnar today Never had a cscope No family history of   colon cancer or prostate cancer Lifestyle is very good , as long as he is active he keeps his weight stable Labs : BMP, FLP, HIV, microalbumin, A1c, TSH  Chronic medical problems seem well-controlled Follow-up 6 months

## 2015-03-24 NOTE — Progress Notes (Signed)
Subjective:    Patient ID: William Mckenzie, male    DOB: 1965/11/03, 49 y.o.   MRN: [id number]  DOS:  03/24/2015 Type of visit - description : Complete physical exam Interval history: Recently seen with URI, improved   Review of Systems Constitutional: No fever. No chills. No unexplained wt changes. No unusual sweats  HEENT: No dental problems, no ear discharge, no facial swelling, no voice changes. No eye discharge, no eye  redness , no  intolerance to light   Respiratory: Respiratory symptoms from a URI definitely improved  Cardiovascular: No CP, no leg swelling , no  Palpitations  GI: no nausea, no vomiting, no diarrhea , no  abdominal pain.  No blood in the stools. No dysphagia, no odynophagia    Endocrine: No polyphagia, no polyuria , no polydipsia  GU: No dysuria, gross hematuria, difficulty urinating. No urinary urgency, no frequency.  Musculoskeletal: No joint swellings or unusual aches or pains, had meniscal tear repair, doing better  Skin: No change in the color of the skin, palor , no  Rash  Allergic, immunologic: No environmental allergies , no  food allergies  Neurological: No dizziness no  syncope. No headaches. No diplopia, no slurred, no slurred speech, no motor deficits, no facial  Numbness  Hematological: No enlarged lymph nodes, no easy bruising , no unusual bleedings  Psychiatry: No suicidal ideas, no hallucinations, no beavior problems, no confusion.  No unusual/severe anxiety, no depression    Past Medical History  Diagnosis Date  . CAD (coronary artery disease)     prior inferior MI in 2010 treated with stenting of the right PDA; had nonobstructive disease in the LAD and LCX with approximately 40% lesions. EF normal  . Hyperlipidemia   . Hypertension     white coat   . Anxiety   . Obesity   . Diabetes mellitus without complication   . Myocardial infarction   . Acute meniscal tear, medial     Left knee    Past Surgical History    Procedure Laterality Date  . Knee surgery  12-2010    LEFT, meniscus . arthroscopy  . Cardiac catheterization    . Coronary angioplasty    . Knee surgery Left 11/2014    Meniscus, medial    History   Social History  . Marital Status: Married    Spouse Name: N/A  . Number of Children: 1  . Years of Education: N/A   Occupational History  . [employer]   . CUSTOMER SERVICE [employer]   Social History Main Topics  . Smoking status: Never Smoker   . Smokeless tobacco: Former Systems developer     Comment: quit chewing tobacco 2010   . Alcohol Use: Yes     Comment: socially   . Drug Use: No  . Sexual Activity: Not on file   Other Topics Concern  . Not on file   Social History Narrative   Lives w/ wife and daughter        Active martial arts           Family History  Problem Relation Age of Onset  . CAD Other     GGF GF   . Colon cancer Neg Hx   . Prostate cancer Neg Hx   . Diabetes Neg Hx   . Hypertension Mother        Medication List       This list is accurate as of: 03/24/15  3:11 PM.  Always  use your most recent med list.               amoxicillin 500 MG capsule  Commonly known as:  AMOXIL  Take 2 capsules (1,000 mg total) by mouth 2 (two) times daily.     aspirin 81 MG tablet  Take 1 tablet (81 mg total) by mouth daily.     atorvastatin 40 MG tablet  Commonly known as:  LIPITOR  Take 1 tablet (40 mg total) by mouth daily.     buPROPion 150 MG 12 hr tablet  Commonly known as:  WELLBUTRIN SR  Take 1 tablet (150 mg total) by mouth 2 (two) times daily.     glucose blood test strip  Commonly known as:  ONE TOUCH ULTRA TEST  Use as instructed     HYDROcodone-homatropine 5-1.5 MG/5ML syrup  Commonly known as:  HYCODAN  Take 5 mLs by mouth at bedtime as needed for cough.     metFORMIN 850 MG tablet  Commonly known as:  GLUCOPHAGE  Take 1 tablet (850 mg total) by mouth 2 (two) times daily with a meal.     metoprolol succinate 25 MG 24 hr tablet  Commonly  known as:  TOPROL-XL  TAKE 1 TABLET (25 MG TOTAL) BY MOUTH DAILY.     nitroGLYCERIN 0.4 MG SL tablet  Commonly known as:  NITROSTAT  Place 1 tablet (0.4 mg total) under the tongue every 5 (five) minutes as needed.     ONE TOUCH LANCETS Misc  Check once daily.           Objective:   Physical Exam BP 116/64 mmHg  Pulse 68  Temp(Src) 98.3 F (36.8 C) (Oral)  Ht 6' (1.829 m)  Wt 252 lb 4 oz (114.42 kg)  BMI 34.20 kg/m2  SpO2 98% General:   Well developed, well nourished . NAD.  Neck:  Full range of motion. Supple. No  thyromegaly , normal carotid pulse HEENT:  Normocephalic . Face symmetric, atraumatic Lungs:  CTA B Normal respiratory effort, no intercostal retractions, no accessory muscle use. Heart: RRR,  no murmur.  No pretibial edema bilaterally  Abdomen:  Not distended, soft, non-tender. No rebound or rigidity. No mass,organomegaly Skin: Exposed areas without rash. Not pale. Not jaundice Neurologic:  alert & oriented X3.  Speech normal, gait appropriate for age and unassisted Strength symmetric and appropriate for age.  Psych: Cognition and judgment appear intact.  Cooperative with normal attention span and concentration.  Behavior appropriate. No anxious or depressed appearing.       Assessment & Plan:

## 2015-03-24 NOTE — Progress Notes (Signed)
Pre visit review using our clinic review tool, if applicable. No additional management support is needed unless otherwise documented below in the visit note. 

## 2015-05-05 ENCOUNTER — Encounter: Payer: Self-pay | Admitting: Internal Medicine

## 2015-05-05 ENCOUNTER — Ambulatory Visit (INDEPENDENT_AMBULATORY_CARE_PROVIDER_SITE_OTHER): Payer: 59 | Admitting: Internal Medicine

## 2015-05-05 VITALS — BP 156/92 | HR 75 | Temp 98.3°F | Ht 72.0 in | Wt 254.5 lb

## 2015-05-05 DIAGNOSIS — J0120 Acute ethmoidal sinusitis, unspecified: Secondary | ICD-10-CM

## 2015-05-05 MED ORDER — AZELASTINE HCL 0.1 % NA SOLN: 2.0000 | Freq: Every evening | NASAL | Status: DC | PRN

## 2015-05-05 MED ORDER — HYDROCODONE-HOMATROPINE 5-1.5 MG/5ML PO SYRP: 5.0000 mL | ORAL_SOLUTION | Freq: Every evening | ORAL | Status: DC | PRN

## 2015-05-05 MED ORDER — AMOXICILLIN 500 MG PO CAPS: 1000.0000 mg | ORAL_CAPSULE | Freq: Two times a day (BID) | ORAL | Status: DC

## 2015-05-05 NOTE — Patient Instructions (Signed)
Rest, fluids , tylenol  For cough:  Take Mucinex DM twice a day as needed until better Hydrocodone if cough persists  For nasal congestion  Use OTC Nasocort or Flonase : 2 nasal sprays on each side of the nose every AM until you feel better ASTELIN at night    Take the antibiotic as prescribed  (Amoxicillin)  Call if not gradually better over the next  10 days  Call anytime if the symptoms are severe

## 2015-05-05 NOTE — Progress Notes (Signed)
Pre visit review using our clinic review tool, if applicable. No additional management support is needed unless otherwise documented below in the visit note. 

## 2015-05-05 NOTE — Progress Notes (Signed)
Subjective:    Patient ID: William Mckenzie, male    DOB: 1966/08/18, 49 y.o.   MRN: [id number]  DOS:  05/05/2015 Type of visit - description :  Acute Interval history: Symptoms started a few days ago with cough and mild headache. [place] and was able to do all the activities but symptoms are gradually worse. He returned home 2 days ago and since then is having severe facial pain and pressure and the cough has increased.   Review of Systems No fever chills No chest pain or difficulty breathing No nasal discharge but he has a small amount of greenish sputum. Some chest congestion. No nausea, vomiting, diarrhea  Past Medical History  Diagnosis Date  . CAD (coronary artery disease)     prior inferior MI in 2010 treated with stenting of the right PDA; had nonobstructive disease in the LAD and LCX with approximately 40% lesions. EF normal  . Hyperlipidemia   . Hypertension     white coat   . Anxiety   . Obesity   . Diabetes mellitus without complication   . Myocardial infarction   . Acute meniscal tear, medial     Left knee    Past Surgical History  Procedure Laterality Date  . Knee surgery  12-2010    LEFT, meniscus . arthroscopy  . Cardiac catheterization    . Coronary angioplasty    . Knee surgery Left 11/2014    Meniscus, medial    History   Social History  . Marital Status: Married    Spouse Name: N/A  . Number of Children: 1  . Years of Education: N/A   Occupational History  . [employer]   . CUSTOMER SERVICE [employer]   Social History Main Topics  . Smoking status: Never Smoker   . Smokeless tobacco: Former Systems developer     Comment: quit chewing tobacco 2010   . Alcohol Use: Yes     Comment: socially   . Drug Use: No  . Sexual Activity: Not on file   Other Topics Concern  . Not on file   Social History Narrative   Lives w/ wife and daughter        Active martial arts              Medication List       This  list is accurate as of: 05/05/15  6:34 PM.  Always use your most recent med list.               amoxicillin 500 MG capsule  Commonly known as:  AMOXIL  Take 2 capsules (1,000 mg total) by mouth 2 (two) times daily.     aspirin 81 MG tablet  Take 1 tablet (81 mg total) by mouth daily.     atorvastatin 40 MG tablet  Commonly known as:  LIPITOR  Take 1 tablet (40 mg total) by mouth daily.     azelastine 0.1 % nasal spray  Commonly known as:  ASTELIN  Place 2 sprays into both nostrils at bedtime as needed for rhinitis. Use in each nostril as directed     buPROPion 150 MG 12 hr tablet  Commonly known as:  WELLBUTRIN SR  Take 1 tablet (150 mg total) by mouth 2 (two) times daily.     glucose blood test strip  Commonly known as:  ONE TOUCH ULTRA TEST  Use as instructed     HYDROcodone-homatropine 5-1.5 MG/5ML syrup  Commonly known as:  HYCODAN  Take 5 mLs by mouth at bedtime as needed for cough.     metFORMIN 850 MG tablet  Commonly known as:  GLUCOPHAGE  Take 1 tablet (850 mg total) by mouth 2 (two) times daily with a meal.     metoprolol succinate 25 MG 24 hr tablet  Commonly known as:  TOPROL-XL  TAKE 1 TABLET (25 MG TOTAL) BY MOUTH DAILY.     nitroGLYCERIN 0.4 MG SL tablet  Commonly known as:  NITROSTAT  Place 1 tablet (0.4 mg total) under the tongue every 5 (five) minutes as needed.     ONE TOUCH LANCETS Misc  Check once daily.           Objective:   Physical Exam BP 156/92 mmHg  Pulse 75  Temp(Src) 98.3 F (36.8 C) (Oral)  Ht 6' (1.829 m)  Wt 254 lb 8 oz (115.44 kg)  BMI 34.51 kg/m2  SpO2 96% General:   Well developed, well nourished . NAD.  HEENT:  Normocephalic . Face symmetric, atraumatic. + TTP at both maxillary and frontal sinuses. Both TMs are bulge, no red, no discharge. Nose is quite congested Lungs:  CTA B Normal respiratory effort, no intercostal retractions, no accessory muscle use. Frequent cough noted Heart: RRR,  no murmur.  No  pretibial edema bilaterally  Skin: Not pale. Not jaundice Neurologic:  alert & oriented X3.  Speech normal, gait appropriate for age and unassisted Psych--  Cognition and judgment appear intact.  Cooperative with normal attention span and concentration.  Behavior appropriate. No anxious or depressed appearing.      Assessment & Plan:   Acute sinusitis Symptoms consistent with sinusitis, see instructions.  BP was elevated upon arrival to 156/92, it was rechecked, 156/90.

## 2015-09-23 ENCOUNTER — Ambulatory Visit: Payer: 59 | Admitting: Internal Medicine

## 2015-10-07 ENCOUNTER — Ambulatory Visit: Payer: 59

## 2015-11-04 ENCOUNTER — Other Ambulatory Visit: Payer: Self-pay | Admitting: Cardiovascular Disease

## 2015-12-17 ENCOUNTER — Encounter: Payer: Self-pay | Admitting: Internal Medicine

## 2015-12-17 ENCOUNTER — Ambulatory Visit (INDEPENDENT_AMBULATORY_CARE_PROVIDER_SITE_OTHER): Payer: 59 | Admitting: Internal Medicine

## 2015-12-17 VITALS — BP 116/74 | HR 76 | Temp 98.2°F | Ht 72.0 in | Wt 254.0 lb

## 2015-12-17 DIAGNOSIS — I2510 Atherosclerotic heart disease of native coronary artery without angina pectoris: Secondary | ICD-10-CM | POA: Diagnosis not present

## 2015-12-17 DIAGNOSIS — E1159 Type 2 diabetes mellitus with other circulatory complications: Secondary | ICD-10-CM

## 2015-12-17 DIAGNOSIS — Z9861 Coronary angioplasty status: Secondary | ICD-10-CM

## 2015-12-17 DIAGNOSIS — Z09 Encounter for follow-up examination after completed treatment for conditions other than malignant neoplasm: Secondary | ICD-10-CM

## 2015-12-17 DIAGNOSIS — E119 Type 2 diabetes mellitus without complications: Secondary | ICD-10-CM | POA: Diagnosis not present

## 2015-12-17 NOTE — Progress Notes (Signed)
Subjective:    Patient ID: William Mckenzie, male    DOB: [birth year], 49 y.o.   MRN: [id number]  DOS:  12/17/2015 Type of visit - description : f/u  Interval history:  Since the last time he was here he is taking his medications correctly. Ambulatory blood sugars 90, 100. In the last few months, he has been very busy at work, unable to exercise, diet has not been the best.  Review of Systems  denies chest pain and difficulty breathing No lower extremity paresthesias Past Medical History  Diagnosis Date  . CAD (coronary artery disease)     prior inferior MI in 2010 treated with stenting of the right PDA; had nonobstructive disease in the LAD and LCX with approximately 40% lesions. EF normal  . Hyperlipidemia   . Hypertension     white coat   . Anxiety   . Obesity   . Diabetes mellitus without complication ([place])   . Myocardial infarction ([place])   . Acute meniscal tear, medial     Left knee    Past Surgical History  Procedure Laterality Date  . Knee surgery  12-2010    LEFT, meniscus . arthroscopy  . Cardiac catheterization    . Coronary angioplasty    . Knee surgery Left 11/2014    Meniscus, medial    Social History   Social History  . Marital Status: Married    Spouse Name: N/A  . Number of Children: 1  . Years of Education: N/A   Occupational History  . [employer]   . CUSTOMER SERVICE [employer]   Social History Main Topics  . Smoking status: Never Smoker   . Smokeless tobacco: Former Systems developer     Comment: quit chewing tobacco 2010   . Alcohol Use: Yes     Comment: socially   . Drug Use: No  . Sexual Activity: Not on file   Other Topics Concern  . Not on file   Social History Narrative   Lives w/ wife and daughter     Active martial arts              Medication List       This list is accurate as of: 12/17/15 11:59 PM.  Always use your most recent med list.               aspirin 81 MG tablet  Take 1 tablet (81 mg total) by mouth daily.      atorvastatin 40 MG tablet  Commonly known as:  LIPITOR  Take 1 tablet (40 mg total) by mouth daily.     buPROPion 150 MG 12 hr tablet  Commonly known as:  WELLBUTRIN SR  Take 1 tablet (150 mg total) by mouth 2 (two) times daily.     metFORMIN 850 MG tablet  Commonly known as:  GLUCOPHAGE  Take 1 tablet (850 mg total) by mouth 2 (two) times daily with a meal.     metoprolol succinate 25 MG 24 hr tablet  Commonly known as:  TOPROL-XL  TAKE 1 TABLET (25 MG TOTAL) BY MOUTH DAILY.     nitroGLYCERIN 0.4 MG SL tablet  Commonly known as:  NITROSTAT  Place 1 tablet (0.4 mg total) under the tongue every 5 (five) minutes as needed.     ONE TOUCH LANCETS Misc  Check once daily.           Objective:   Physical Exam BP 116/74 mmHg  Pulse 76  Temp(Src)  98.2 F (36.8 C) (Oral)  Ht 6' (1.829 m)  Wt 254 lb (115.214 kg)  BMI 34.44 kg/m2  SpO2 97% General:   Well developed, well nourished . NAD.  HEENT:  Normocephalic . Face symmetric, atraumatic Lungs:  CTA B Normal respiratory effort, no intercostal retractions, no accessory muscle use. Heart: RRR,  no murmur.  No pretibial edema bilaterally  Skin: Not pale. Not jaundice DIABETIC FEET EXAM: No lower extremity edema Normal pedal pulses bilaterally Skin normal, nails normal, no calluses Pinprick examination of the feet normal. Neurologic:  alert & oriented X3.  Speech normal, gait appropriate for age and unassisted Psych--  Cognition and judgment appear intact.  Cooperative with normal attention span and concentration.  Behavior appropriate. No anxious or depressed appearing.      Assessment & Plan:  Assessment DM w/ CAD HTN Hyperlipidemia Depression-Anxiety CAD, MI, angioplasty   Plan: DM: Previous A1c is discussed with the patient, last A1c 6.8, goal 6.5. He has not been able to eat healthier and exercise lately but is ready to go back to a healthy lifestyle. Continue metformin, check a A1c. Consider add  another medication if A1c above 7. HTN: Well-controlled CAD: Asymptomatic RTC 3-4 months, CPX

## 2015-12-17 NOTE — Progress Notes (Signed)
Pre visit review using our clinic review tool, if applicable. No additional management support is needed unless otherwise documented below in the visit note. 

## 2015-12-17 NOTE — Patient Instructions (Signed)
GO TO THE LAB :      Get the blood work     GO TO THE FRONT DESK Schedule your next appointment for a  Physical exam  When?   3 or 4 months  Fasting?  Yes    .

## 2015-12-18 DIAGNOSIS — Z09 Encounter for follow-up examination after completed treatment for conditions other than malignant neoplasm: Secondary | ICD-10-CM | POA: Insufficient documentation

## 2015-12-18 LAB — BASIC METABOLIC PANEL
BUN: 15 mg/dL (ref 6–23)
CO2: 30 mEq/L (ref 19–32)
Calcium: 9.7 mg/dL (ref 8.4–10.5)
Chloride: 105 mEq/L (ref 96–112)
Creatinine, Ser: 1.07 mg/dL (ref 0.40–1.50)
GFR: 77.8 mL/min (ref >60.00)
Glucose, Bld: 135 mg/dL — ABNORMAL HIGH (ref 70–99)
Potassium: 4.1 mEq/L (ref 3.5–5.1)
SODIUM: 142 meq/L (ref 135–145)

## 2015-12-18 LAB — HEMOGLOBIN A1C: HEMOGLOBIN A1C: 7.9 % — AB (ref 4.6–6.5)

## 2015-12-18 NOTE — Assessment & Plan Note (Signed)
DM: Previous A1c is discussed with the patient, last A1c 6.8, goal 6.5. He has not been able to eat healthier and exercise lately but is ready to go back to a healthy lifestyle. Continue metformin, check a A1c. Consider add another medication if A1c above 7. HTN: Well-controlled CAD: Asymptomatic RTC 3-4 months, CPX

## 2015-12-21 ENCOUNTER — Other Ambulatory Visit: Payer: Self-pay | Admitting: Internal Medicine

## 2015-12-22 LAB — HM DIABETES EYE EXAM

## 2015-12-22 MED ORDER — SITAGLIPTIN PHOSPHATE 100 MG PO TABS: 100.0000 mg | ORAL_TABLET | Freq: Every day | ORAL | Status: DC

## 2015-12-22 MED ORDER — METFORMIN HCL 1000 MG PO TABS: 1000.0000 mg | ORAL_TABLET | Freq: Two times a day (BID) | ORAL | Status: DC

## 2015-12-22 NOTE — Addendum Note (Signed)
Addended by: Damita Dunnings D on: 12/22/2015 09:23 AM   Modules accepted: Orders, Medications

## 2015-12-29 ENCOUNTER — Encounter: Payer: Self-pay | Admitting: Internal Medicine

## 2016-02-01 ENCOUNTER — Other Ambulatory Visit: Payer: Self-pay | Admitting: Cardiovascular Disease

## 2016-02-23 ENCOUNTER — Other Ambulatory Visit: Payer: Self-pay | Admitting: Cardiovascular Disease

## 2016-03-06 ENCOUNTER — Other Ambulatory Visit: Payer: Self-pay | Admitting: Internal Medicine

## 2016-03-15 IMAGING — CR DG CHEST 2V
2 series · 2 of 2 positions shown · non-contrast
Comparison: October 27, 2012

CLINICAL DATA: 3 hr history of facial numbness. Weakness and
sweating. History of prior myocardial infarct.

EXAM:
CHEST  2 VIEW

[w chest pa]
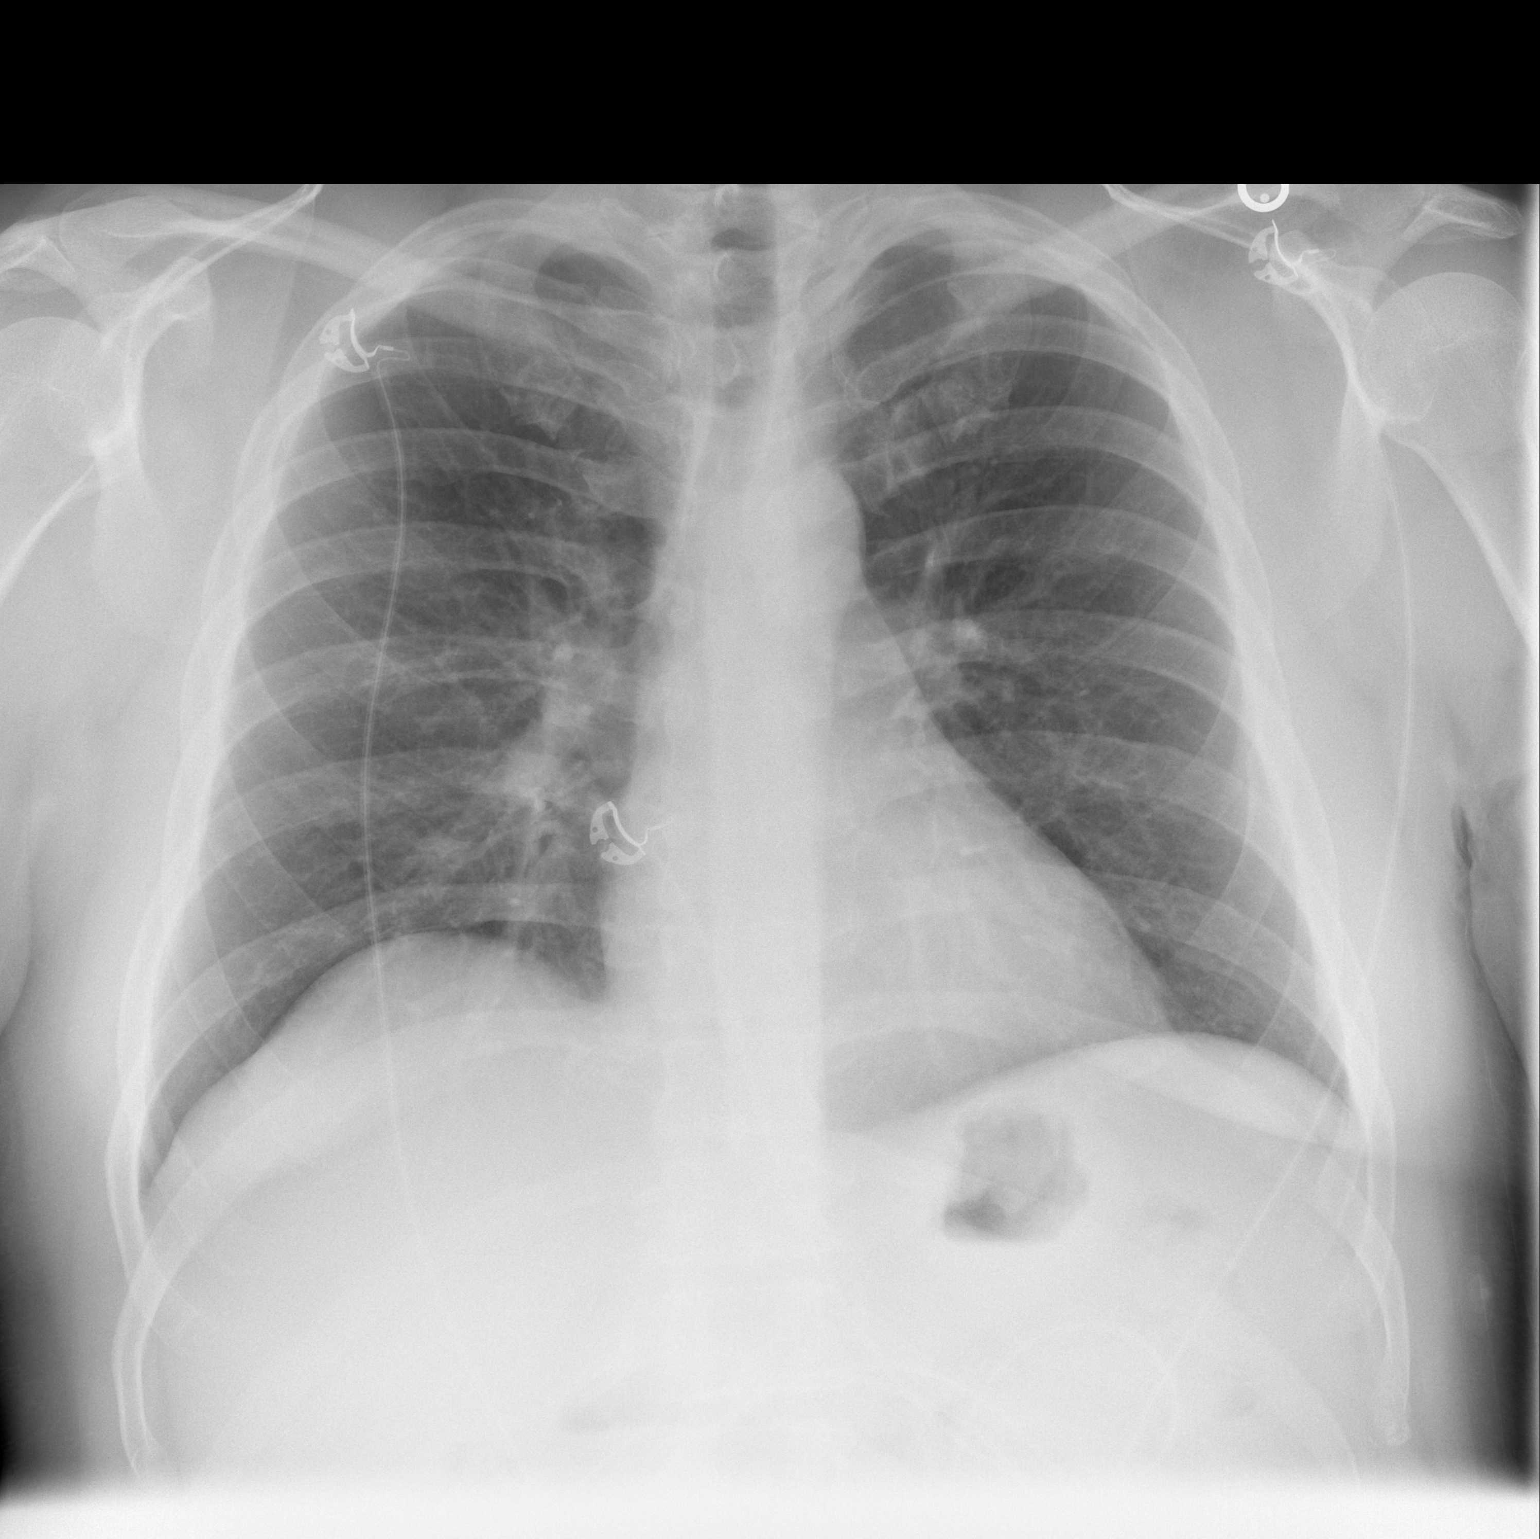

[w chest lat]
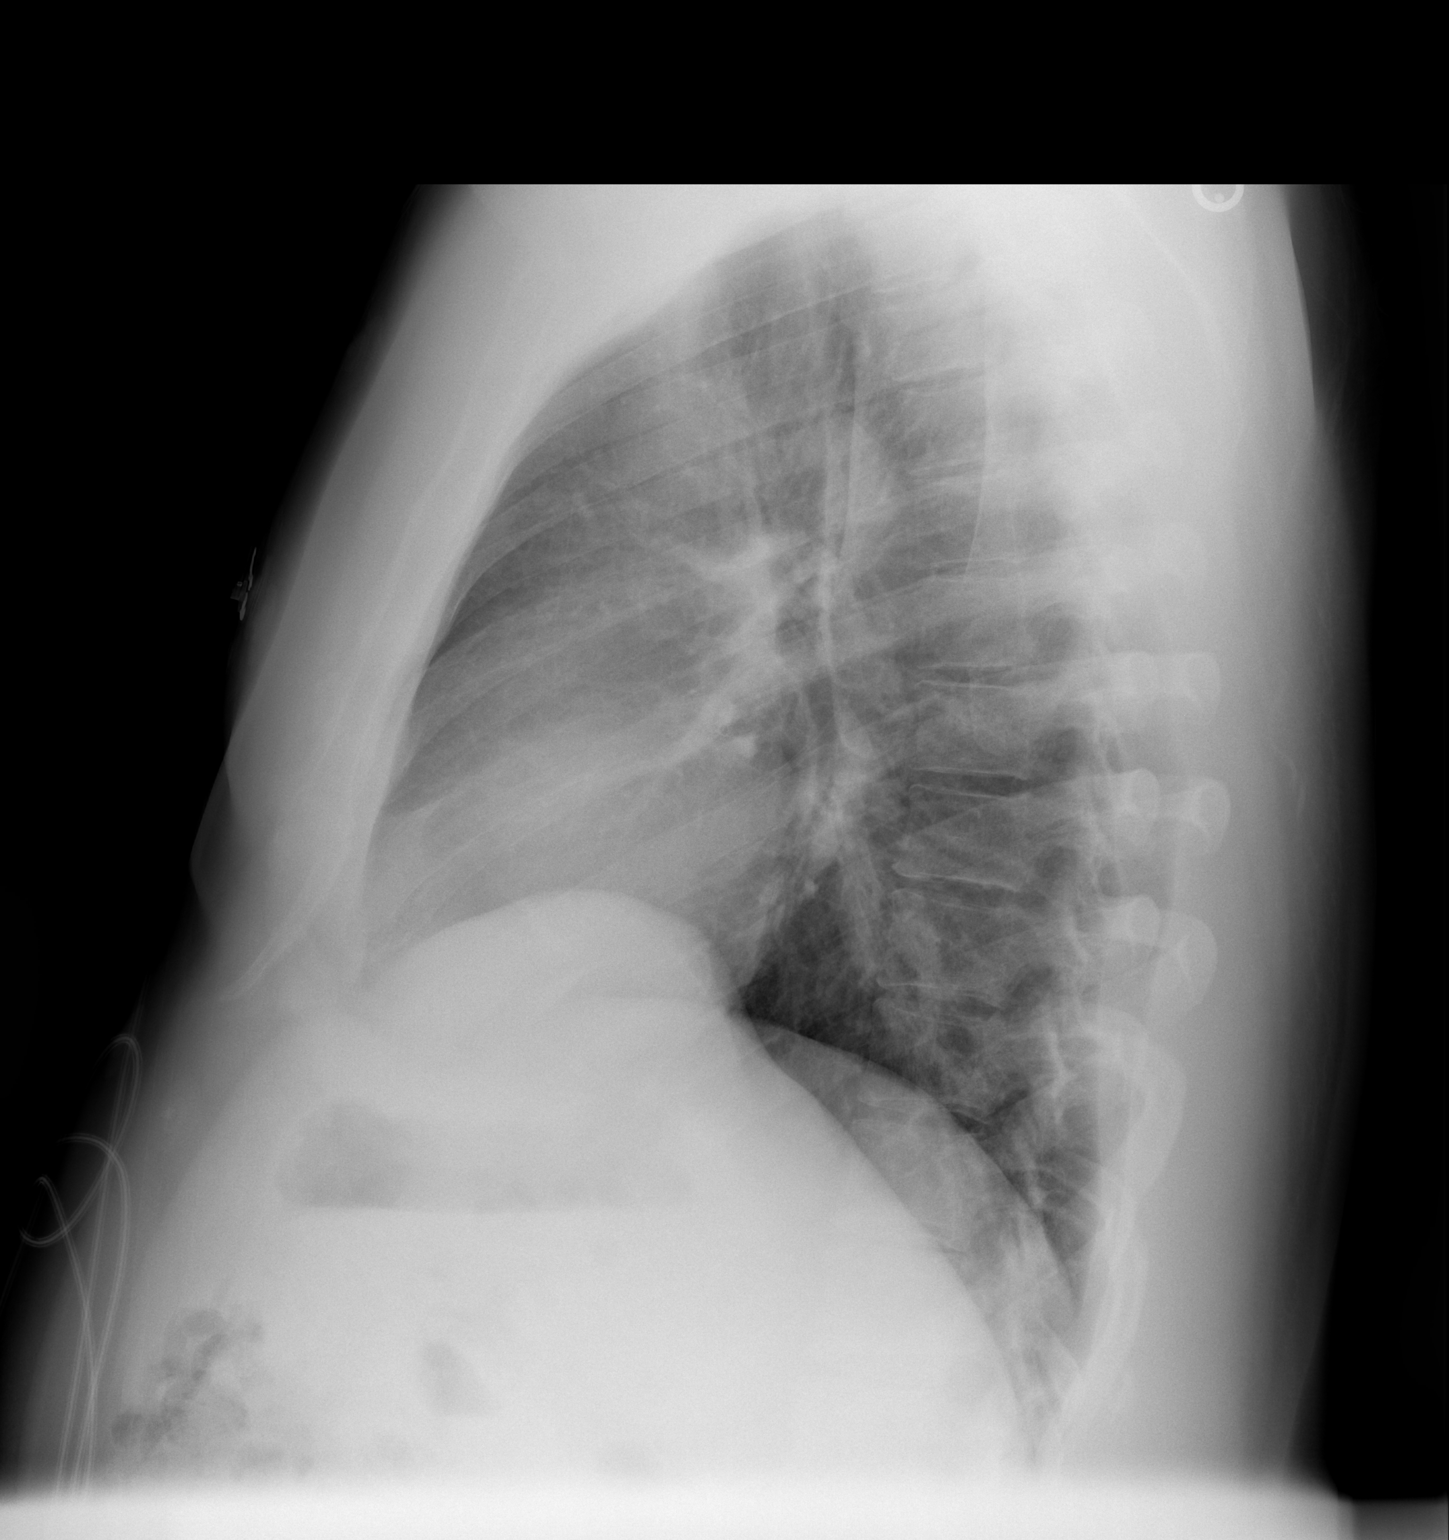

[2 of 2 positions shown; findings below may reference images not displayed]

FINDINGS: There is no edema or consolidation. Heart size and pulmonary
vascularity are normal. No adenopathy. No bone lesions. Mild
anterior eventration of the right hemidiaphragm is stable.
IMPRESSION: No edema or consolidation.

## 2016-03-15 IMAGING — CT CT HEAD W/O CM
1 series · 16 of 30 positions shown, 20 images · non-contrast
Comparison: 09/10/2007

CLINICAL DATA: Face, neck, and jaw tingling last night. This
morning felt shaky and tired. Nausea.

EXAM:
CT HEAD WITHOUT CONTRAST
TECHNIQUE: Contiguous axial images were obtained from the base of the skull
through the vertex without intravenous contrast.

[Series 2: head 5.0 h30s · axial · 0.46mm/px · z∈[-153,-3]mm · 16 of 34 slices shown, 20 images]
[im 2/34  brain]
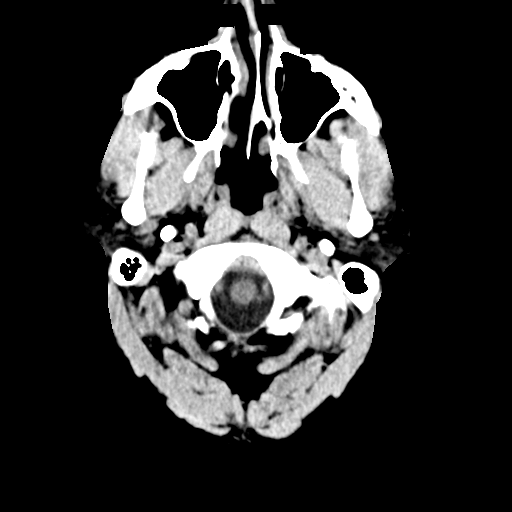
[im 2/34  bone]
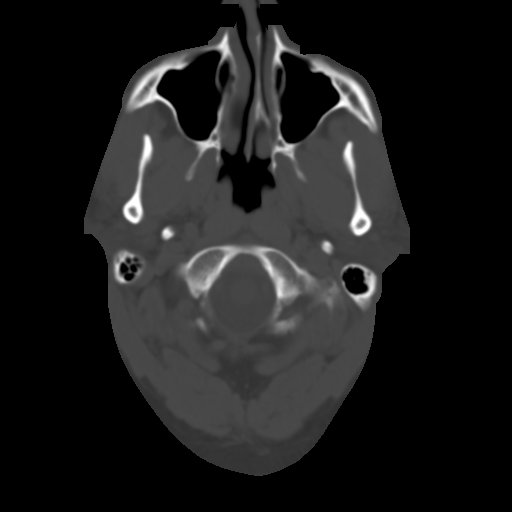
[im 4/34  brain]
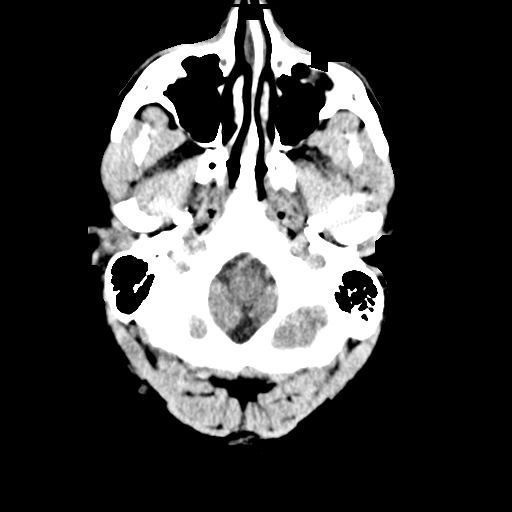
[im 6/34  brain]
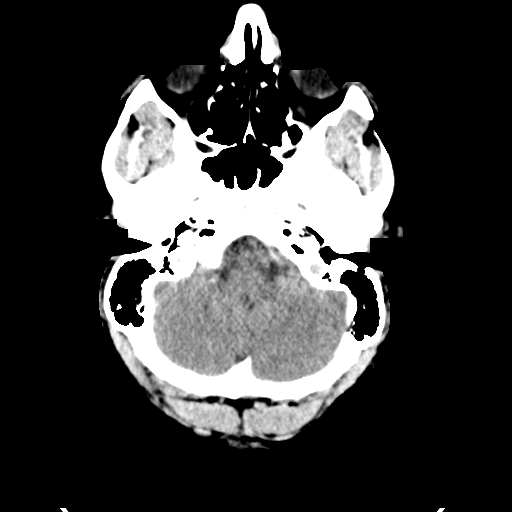
[im 8/34  brain]
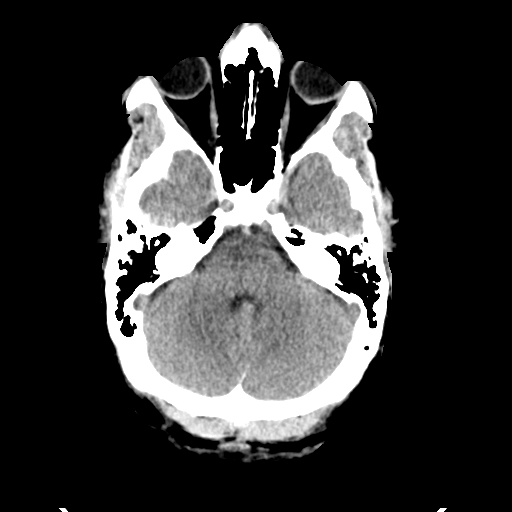
[im 10/34  brain]
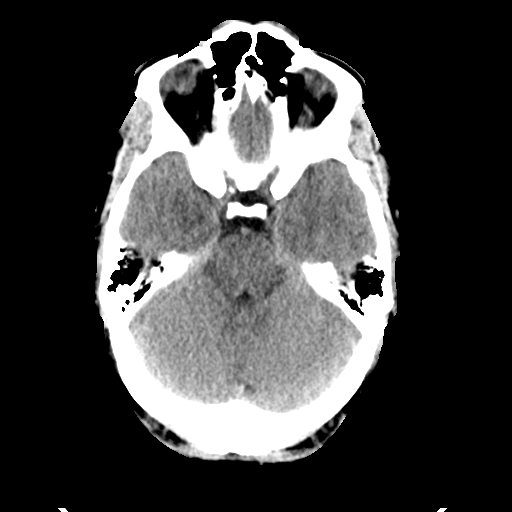
[im 10/34  bone]
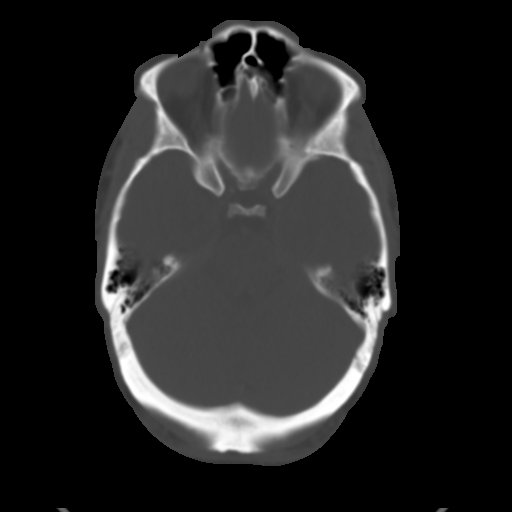
[im 12/34  brain]
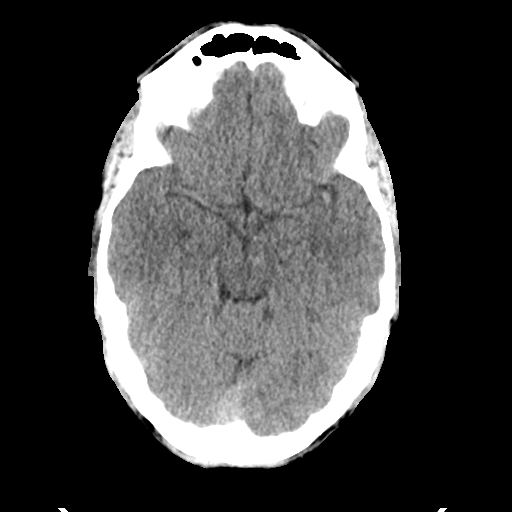
[im 14/34  brain]
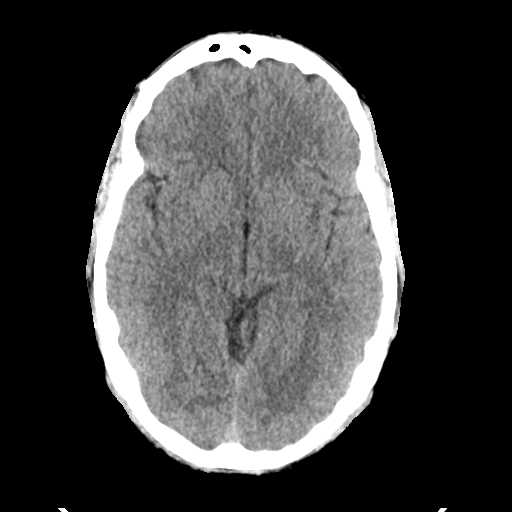
[im 16/34  brain]
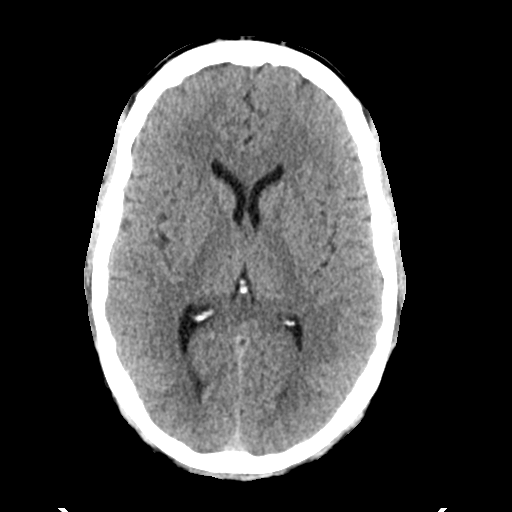
[im 18/34  brain]
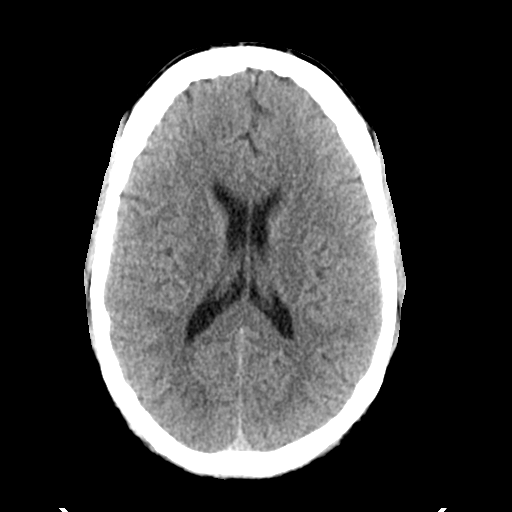
[im 18/34  bone]
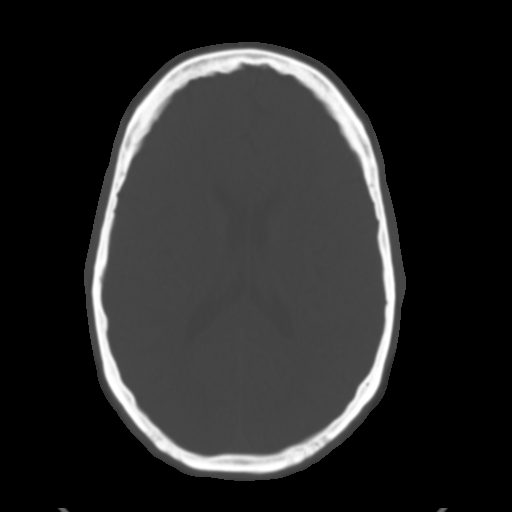
[im 20/34  brain]
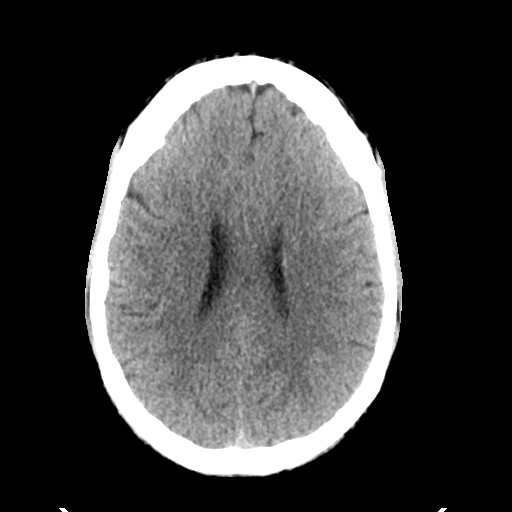
[im 22/34  brain]
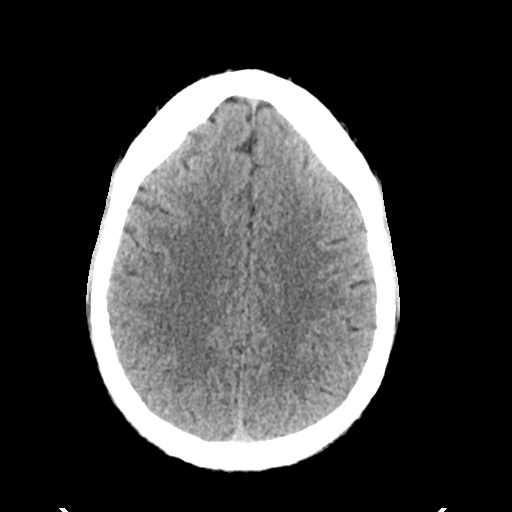
[im 24/34  brain]
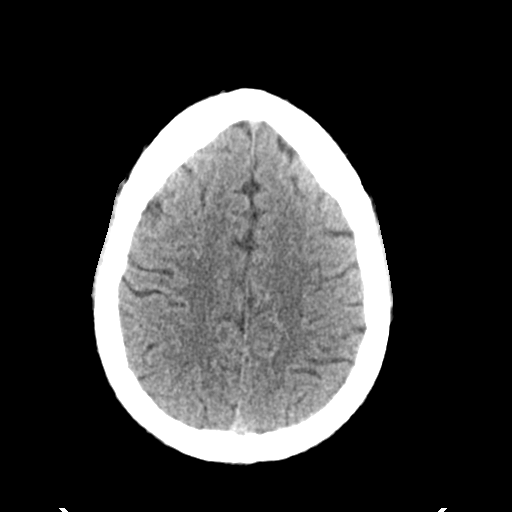
[im 26/34  brain]
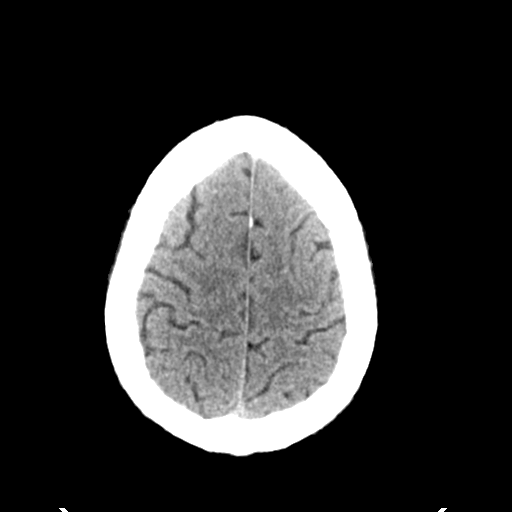
[im 26/34  bone]
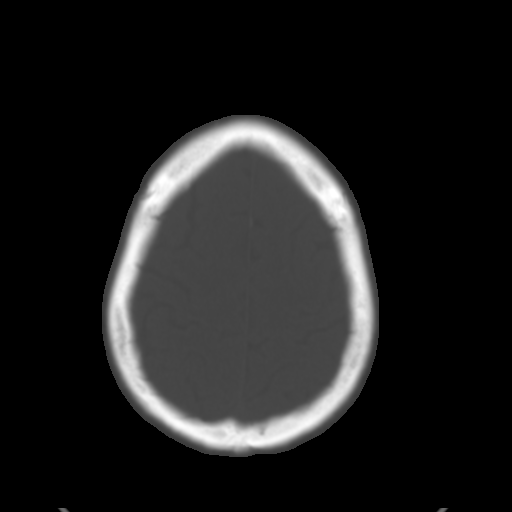
[im 28/34  brain]
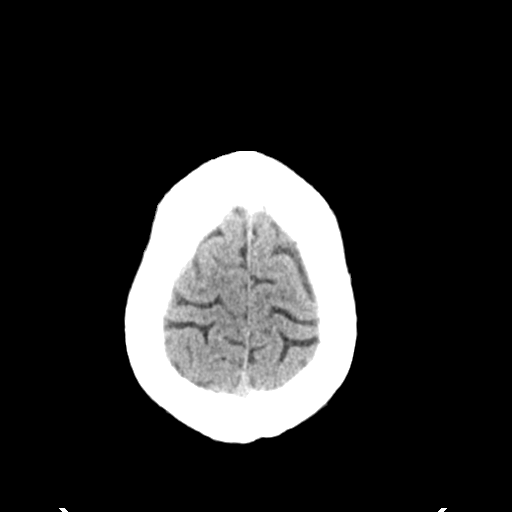
[im 30/34  brain]
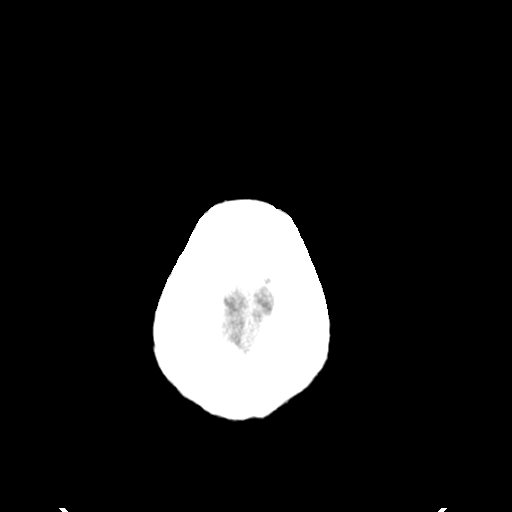
[im 32/34  brain]
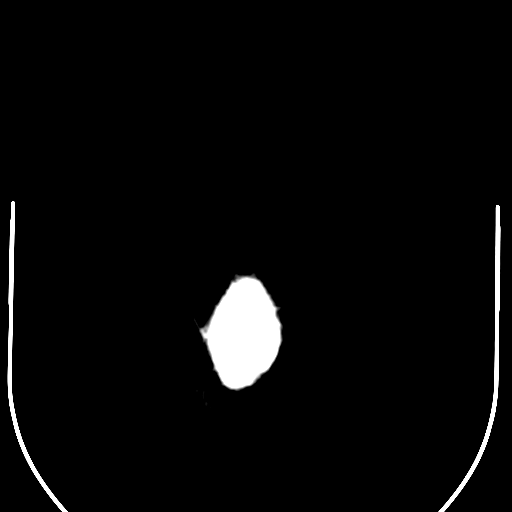

[16 of 30 positions shown; findings below may reference images not displayed]

FINDINGS: Ventricles and sulci appear symmetrical. No mass effect or midline
shift. No abnormal extra-axial fluid collections. Gray-white matter
junctions are distinct. Basal cisterns are not effaced. No evidence
of acute intracranial hemorrhage. No depressed skull fractures.
Visualized paranasal sinuses and mastoid air cells are not
opacified.
IMPRESSION: No acute intracranial abnormalities.  Normal examination.

## 2016-03-29 ENCOUNTER — Other Ambulatory Visit: Payer: Self-pay | Admitting: *Deleted

## 2016-03-29 MED ORDER — METOPROLOL SUCCINATE ER 25 MG PO TB24: 25.0000 mg | ORAL_TABLET | Freq: Every day | ORAL | Status: DC

## 2016-04-06 ENCOUNTER — Encounter: Payer: 59 | Admitting: Internal Medicine

## 2016-04-13 ENCOUNTER — Other Ambulatory Visit: Payer: Self-pay | Admitting: Cardiovascular Disease

## 2016-04-15 NOTE — Telephone Encounter (Signed)
I left the pt a voicemail that he needs to contact the office to arrange a first available appointment with Dr Burt Knack (currently October) and then we can authorize enough refills to last until scheduled appointment. I did go ahead and send 7 additional tablets to the pharmacy.

## 2016-04-26 ENCOUNTER — Telehealth: Payer: Self-pay | Admitting: Cardiovascular Disease

## 2016-04-26 MED ORDER — METOPROLOL SUCCINATE ER 25 MG PO TB24: ORAL_TABLET | ORAL | 0 refills | Status: DC

## 2016-04-26 NOTE — Telephone Encounter (Signed)
New message     *STAT* If patient is at the pharmacy, call can be transferred to refill team.   1. Which medications need to be refilled? (please list name of each medication and dose if known) metroprolol 25mg   2. Which pharmacy/location (including street and city if local pharmacy) is medication to be sent to? [facility] on [address] and [address]  3. Do they need a 30 day or 90 day supply? [place]

## 2016-04-27 ENCOUNTER — Encounter: Payer: Self-pay | Admitting: Cardiovascular Disease

## 2016-04-28 ENCOUNTER — Encounter: Payer: Self-pay | Admitting: Cardiovascular Disease

## 2016-04-28 ENCOUNTER — Encounter: Payer: Self-pay | Admitting: Physician Assistant

## 2016-04-28 ENCOUNTER — Other Ambulatory Visit (HOSPITAL_COMMUNITY)
Admission: RE | Admit: 2016-04-28 | Discharge: 2016-04-28 | Disposition: A | Discharge status: Home / Self Care | Payer: 59 | Source: Ambulatory Visit | Attending: Physician Assistant | Admitting: Physician Assistant

## 2016-04-28 ENCOUNTER — Ambulatory Visit (INDEPENDENT_AMBULATORY_CARE_PROVIDER_SITE_OTHER): Payer: 59 | Admitting: Cardiovascular Disease

## 2016-04-28 ENCOUNTER — Ambulatory Visit (INDEPENDENT_AMBULATORY_CARE_PROVIDER_SITE_OTHER): Payer: 59 | Admitting: Physician Assistant

## 2016-04-28 ENCOUNTER — Encounter (INDEPENDENT_AMBULATORY_CARE_PROVIDER_SITE_OTHER): Payer: Self-pay

## 2016-04-28 VITALS — BP 122/82 | HR 96 | Temp 98.1°F | Resp 16 | Ht 72.0 in | Wt 260.2 lb

## 2016-04-28 VITALS — BP 124/86 | HR 79 | Ht 72.0 in | Wt 258.6 lb

## 2016-04-28 DIAGNOSIS — I10 Essential (primary) hypertension: Secondary | ICD-10-CM

## 2016-04-28 DIAGNOSIS — E785 Hyperlipidemia, unspecified: Secondary | ICD-10-CM

## 2016-04-28 DIAGNOSIS — L918 Other hypertrophic disorders of the skin: Secondary | ICD-10-CM | POA: Insufficient documentation

## 2016-04-28 DIAGNOSIS — I2510 Atherosclerotic heart disease of native coronary artery without angina pectoris: Secondary | ICD-10-CM | POA: Diagnosis not present

## 2016-04-28 DIAGNOSIS — Z23 Encounter for immunization: Secondary | ICD-10-CM | POA: Diagnosis not present

## 2016-04-28 MED ORDER — METOPROLOL SUCCINATE ER 25 MG PO TB24: ORAL_TABLET | ORAL | 3 refills | Status: DC

## 2016-04-28 NOTE — Progress Notes (Signed)
Patient presents to clinic today c/o skin in the R axillary region. Endorses area gets irritated intermittently from friction. Denies drainage from lesion. Also endorses skin tag of LLQ abdomen that is asymptomatic. Would like to have both areas removed today if possible. Denies allergies to medications. Has tolerated local anesthesia well before.   Past Medical History:  Diagnosis Date  . Acute meniscal tear, medial    Left knee  . Anxiety   . CAD (coronary artery disease)    prior inferior MI in 2010 treated with stenting of the right PDA; had nonobstructive disease in the LAD and LCX with approximately 40% lesions. EF normal  . Diabetes mellitus without complication ([place])   . Hyperlipidemia   . Hypertension    white coat   . Myocardial infarction ([place])   . Obesity     Current Outpatient Prescriptions on File Prior to Visit  Medication Sig Dispense Refill  . aspirin 81 MG tablet Take 1 tablet (81 mg total) by mouth daily.    Marland Kitchen atorvastatin (LIPITOR) 40 MG tablet Take 1 tablet (40 mg total) by mouth daily. 90 tablet 2  . buPROPion (WELLBUTRIN SR) 150 MG 12 hr tablet Take 1 tablet (150 mg total) by mouth 2 (two) times daily. 180 tablet 2  . metFORMIN (GLUCOPHAGE) 1000 MG tablet Take 1 tablet (1,000 mg total) by mouth 2 (two) times daily with a meal. 180 tablet 1  . metoprolol succinate (TOPROL-XL) 25 MG 24 hr tablet TAKE 1 TABLET BY MOUTH ONCE DAILY 3 tablet 0  . nitroGLYCERIN (NITROSTAT) 0.4 MG SL tablet Place 1 tablet (0.4 mg total) under the tongue every 5 (five) minutes as needed. 25 tablet 2  . ONE TOUCH LANCETS MISC Check once daily. 200 each 3  . sitaGLIPtin (JANUVIA) 100 MG tablet Take 1 tablet (100 mg total) by mouth daily. 90 tablet 1   No current facility-administered medications on file prior to visit.     No Known Allergies  Family History  Problem Relation Age of Onset  . Hypertension Mother   . CAD Other     GGF GF   . Colon cancer Neg Hx   . Prostate  cancer Neg Hx   . Diabetes Neg Hx     Social History   Social History  . Marital status: Married    Spouse name: N/A  . Number of children: 1  . Years of education: N/A   Occupational History  . former [employer]   . CUSTOMER SERVICE [employer]   Social History Main Topics  . Smoking status: Never Smoker  . Smokeless tobacco: Former Systems developer     Comment: quit chewing tobacco 2010   . Alcohol use Yes     Comment: socially   . Drug use: No  . Sexual activity: Not Asked   Other Topics Concern  . None   Social History Narrative   Lives w/ wife and daughter     Active martial arts         Review of Systems - See HPI.  All other ROS are negative.  BP 122/82 (BP Location: Right Arm, Patient Position: Sitting, Cuff Size: Large)   Pulse 96   Temp 98.1 F (36.7 C) (Oral)   Resp 16   Ht 6' (1.829 m)   Wt 260 lb 4 oz (118 kg)   SpO2 98%   BMI 35.30 kg/m   Physical Exam  Skin:       Assessment/Plan: 1. Cutaneous  skin tags Two wanting removed. R axillary tag with some atypical characteristics -- likely from constant friction and irritation. Tag of lower abdomen without abnormal characteristics. Tags removed today. Areas were both prepped with betadine. Local anesthesia with 1% lidocaine with epi applied to the base of tags. Tags removed with sterile scissors. No bleeding. Bandage applied. R axillary tag will be sent to pathology for further assessment giving atypical characteristics. Will call patient with results. Wound care reviewed. Return precautions discussed with patient.     Leeanne Rio, PA-C

## 2016-04-28 NOTE — Patient Instructions (Signed)

## 2016-04-28 NOTE — Addendum Note (Signed)
Addended by: Rockwell Germany on: 04/28/2016 09:58 AM   Modules accepted: Orders

## 2016-04-28 NOTE — Patient Instructions (Signed)
The tags were removed today. I have sent the one from your arm pit to the lab just to make sure this is a run-of-the-mill skin tag, due to its atypical characteristics.  Please keep skin clean and dry. Wash with soapy water. If you develop any redness, warmth or tenderness at the site, call me as these are signs of infection.  Have a great week off!

## 2016-04-28 NOTE — Progress Notes (Signed)
Cardiology Office Note Date:  04/28/2016   ID:  William Mckenzie, DOB 1966/07/03, MRN [id number]  PCP:  Kathlene November, MD  Cardiologist:  Sherren Mocha, MD    Chief Complaint  Patient presents with  . Follow-up    CAD   History of Present Illness: William Mckenzie is a 50 y.o. male who presents for follow-up of coronary artery disease. The patient initially presented with an inferior wall MI in 2010. He was treated with primary PCI of the PDA branch with a drug-eluting stent. Other problems include hypertension, type 2 diabetes, and hyperlipidemia. The patient was hospitalized in February 2016 with back pain and nausea. Symptoms were similar to those of his MI. He ruled out by serial enzymes. A nuclear scan demonstrated scar without ischemia. An echocardiogram showed normal LV systolic function with an ejection fraction of 55-60%. No further cardiac evaluation was recommended at that time. He presents today for follow-up evaluation.  Last lipids from June 2016 showed cholesterol of 125, triglycerides 94, HDL 44, and LDL 63. Hemoglobin A1c from March 2017 is 7.9.  The patient is doing well. He hasn't been exercising as much lately because of a hamstring pull. He does Tae-Kwan-Do and normally has no exertional symptoms. Today, he denies symptoms of palpitations, chest pain, shortness of breath, orthopnea, PND, lower extremity edema, dizziness, or syncope.  Past Medical History:  Diagnosis Date  . Acute meniscal tear, medial    Left knee  . Anxiety   . CAD (coronary artery disease)    prior inferior MI in 2010 treated with stenting of the right PDA; had nonobstructive disease in the LAD and LCX with approximately 40% lesions. EF normal  . Diabetes mellitus without complication ([place])   . Hyperlipidemia   . Hypertension    white coat   . Myocardial infarction ([facility])   . Obesity     Past Surgical History:  Procedure Laterality Date  . CARDIAC CATHETERIZATION    . CORONARY  ANGIOPLASTY    . KNEE SURGERY  12-2010   LEFT, meniscus . arthroscopy  . KNEE SURGERY Left 11/2014   Meniscus, medial    Current Outpatient Prescriptions  Medication Sig Dispense Refill  . aspirin 81 MG tablet Take 1 tablet (81 mg total) by mouth daily.    Marland Kitchen atorvastatin (LIPITOR) 40 MG tablet Take 1 tablet (40 mg total) by mouth daily. 90 tablet 2  . buPROPion (WELLBUTRIN SR) 150 MG 12 hr tablet Take 1 tablet (150 mg total) by mouth 2 (two) times daily. 180 tablet 2  . metFORMIN (GLUCOPHAGE) 1000 MG tablet Take 1 tablet (1,000 mg total) by mouth 2 (two) times daily with a meal. 180 tablet 1  . metoprolol succinate (TOPROL-XL) 25 MG 24 hr tablet TAKE 1 TABLET BY MOUTH ONCE DAILY 3 tablet 0  . nitroGLYCERIN (NITROSTAT) 0.4 MG SL tablet Place 1 tablet (0.4 mg total) under the tongue every 5 (five) minutes as needed. 25 tablet 2  . ONE TOUCH LANCETS MISC Check once daily. 200 each 3  . sitaGLIPtin (JANUVIA) 100 MG tablet Take 1 tablet (100 mg total) by mouth daily. 90 tablet 1   No current facility-administered medications for this visit.     Allergies:   Review of patient's allergies indicates no known allergies.   Social History:  The patient  reports that he has never smoked. He has quit using smokeless tobacco. He reports that he drinks alcohol. He reports that he does not use drugs.  Family History:  The patient's family history includes CAD in his other; Hypertension in his mother.    ROS:  Please see the history of present illness.  Otherwise, review of systems is positive for recent pulled hamstring.  All other systems are reviewed and negative.    PHYSICAL EXAM: VS:  BP 124/86   Pulse 79   Ht 6' (1.829 m)   Wt 258 lb 9.6 oz (117.3 kg)   BMI 35.07 kg/m  , BMI Body mass index is 35.07 kg/m. GEN: Well nourished, well developed, in no acute distress  HEENT: normal  Neck: no JVD, no masses. No carotid bruits Cardiac: RRR without murmur or gallop                Respiratory:   clear to auscultation bilaterally, normal work of breathing GI: soft, nontender, nondistended, + BS MS: no deformity or atrophy  Ext: no pretibial edema, pedal pulses 2+= bilaterally Skin: warm and dry, no rash Neuro:  Strength and sensation are intact Psych: euthymic mood, full affect  EKG:  EKG is ordered today. The ekg ordered today shows normal sinus rhythm, rightward axis, possible age-indeterminate inferior infarct, heart rate 79 beats  Recent Labs: 12/17/2015: BUN 15; Creatinine, Ser 1.07; Potassium 4.1; Sodium 142   Lipid Panel     Component Value Date/Time   CHOL 125 03/24/2015 0838   TRIG 94.0 03/24/2015 0838   HDL 43.50 03/24/2015 0838   CHOLHDL 3 03/24/2015 0838   VLDL 18.8 03/24/2015 0838   LDLCALC 63 03/24/2015 0838   LDLDIRECT 179.4 11/21/2007 1156      Wt Readings from Last 3 Encounters:  04/28/16 258 lb 9.6 oz (117.3 kg)  04/28/16 260 lb 4 oz (118 kg)  12/17/15 254 lb (115.2 kg)     Cardiac Studies Reviewed: Myoview 11/05/14 IMPRESSION: 1. Moderate sized, medium intensity fixed defect in the inferior wall consistent with diaphragmatic attenuation. No reversible ischemia or infarction. 2. Mildly reduced left ventricular wall motion. 3. Left ventricular ejection fraction 44% 4. Intermediate-risk stress test findings*.  Echo 11/05/14 - EF 55-60%. Wall motion was normal - Right ventricle: The cavity size was normal. Systolic function was normal.  Cardiac Cath/PCI 02/2009 LAD:  prox 40%, mid 40% LCx:  Mid 40% RCA:  PCA 100% >> PCI:  2.5- x 96mm Xience DES EF:  55% with inf-apical AK  ASSESSMENT AND PLAN: 1.  CAD, native vessel, without symptoms of angina: The patient's medications are reviewed and no changes recommended. We discussed his good prognosis now that he is 7 years out from his MI with no recurrent events. His other cardiovascular risk factors appear to be well controlled.  2. Essential hypertension: Blood pressure is at goal. We discussed the  importance of diet and exercise.  3. Hyperlipidemia: Lipids as reviewed above. LDL is at goal. The patient is on a statin drug.  4. Type 2 diabetes: Followed by Dr. Larose Kells. His oral hypoglycemics were intensified after his last hemoglobin A1c was elevated. He is working on weight loss.  Current medicines are reviewed with the patient today.  The patient does not have concerns regarding medicines.  Labs/ tests ordered today include:  No orders of the defined types were placed in this encounter.  Disposition:   FU one year  Signed, Sherren Mocha, MD  04/28/2016 3:24 PM    [facility] [address], [place], [place]  [address] Phone: [phone]; Fax: [phone]

## 2016-05-03 ENCOUNTER — Encounter: Payer: Self-pay | Admitting: Internal Medicine

## 2016-05-03 ENCOUNTER — Ambulatory Visit (INDEPENDENT_AMBULATORY_CARE_PROVIDER_SITE_OTHER): Payer: 59 | Admitting: Internal Medicine

## 2016-05-03 VITALS — BP 124/78 | HR 76 | Temp 98.0°F | Resp 12 | Ht 72.0 in | Wt 254.4 lb

## 2016-05-03 DIAGNOSIS — E785 Hyperlipidemia, unspecified: Secondary | ICD-10-CM | POA: Diagnosis not present

## 2016-05-03 DIAGNOSIS — K648 Other hemorrhoids: Secondary | ICD-10-CM | POA: Diagnosis not present

## 2016-05-03 DIAGNOSIS — Z125 Encounter for screening for malignant neoplasm of prostate: Secondary | ICD-10-CM | POA: Diagnosis not present

## 2016-05-03 DIAGNOSIS — Z0001 Encounter for general adult medical examination with abnormal findings: Secondary | ICD-10-CM | POA: Diagnosis not present

## 2016-05-03 DIAGNOSIS — K625 Hemorrhage of anus and rectum: Secondary | ICD-10-CM

## 2016-05-03 DIAGNOSIS — I10 Essential (primary) hypertension: Secondary | ICD-10-CM

## 2016-05-03 DIAGNOSIS — Z0000 Encounter for general adult medical examination without abnormal findings: Secondary | ICD-10-CM

## 2016-05-03 DIAGNOSIS — E118 Type 2 diabetes mellitus with unspecified complications: Secondary | ICD-10-CM

## 2016-05-03 DIAGNOSIS — I2510 Atherosclerotic heart disease of native coronary artery without angina pectoris: Secondary | ICD-10-CM

## 2016-05-03 DIAGNOSIS — Z1211 Encounter for screening for malignant neoplasm of colon: Secondary | ICD-10-CM

## 2016-05-03 NOTE — Assessment & Plan Note (Addendum)
Tdap 2009; pnm 23 shot 2015;  Prevnar 2016 CCS: Never had a cscope, no FH, different modalities of the screen discussed, elected GI ref for cscope RBPR: likely a benign etiology , to have a cscope , see above  Prostate cancer screening: DRE normal today, check a PSA Lifestyle : Discussed. Labs: BMP, AST, ALT, FLP, CBC, PSA, A1c, micro

## 2016-05-03 NOTE — Patient Instructions (Signed)
GO TO THE LAB : Get the blood work     GO TO THE FRONT DESK Schedule your next appointment for a  checkup in 3 months  

## 2016-05-03 NOTE — Progress Notes (Signed)
Subjective:    Patient ID: William Mckenzie, male    DOB: 1966/02/11, 50 y.o.   MRN: [id number]  DOS:  05/03/2016 Type of visit - description : cpx Interval history: We also discussed other issues. See below.   Review of Systems Constitutional: No fever. No chills. No unexplained wt changes. No unusual sweats  HEENT: No dental problems, no ear discharge, no facial swelling, no voice changes. No eye discharge, no eye  redness , no  intolerance to light   Respiratory: No wheezing , no  difficulty breathing. No cough , no mucus production  Cardiovascular: No CP, no leg swelling , no  Palpitations  GI: no nausea, no vomiting, no diarrhea , no  abdominal pain.  No dysphagia, no odynophagia    For 3 or 4 years has seen occasionally red fresh blood per rectum when he has a bowel movement, sometimes in the toilet paper sometimes in the bowl. No weight loss, fever, chills. Stools are brown and normal appearing otherwice  Endocrine: No polyphagia, no polyuria , no polydipsia  GU: No dysuria, gross hematuria, difficulty urinating. No urinary urgency, no frequency.  Musculoskeletal: No joint swellings or unusual aches or pains  Skin: No change in the color of the skin, palor , no  Rash  Allergic, immunologic: No environmental allergies , no  food allergies  Neurological: No dizziness no  syncope. No headaches. No diplopia, no slurred, no slurred speech, no motor deficits, no facial  Numbness  Hematological: No enlarged lymph nodes, no easy bruising , no unusual bleedings  Psychiatry: No suicidal ideas, no hallucinations, no beavior problems, no confusion.  No unusual/severe anxiety, no depression   Past Medical History:  Diagnosis Date  . Acute meniscal tear, medial    Left knee  . Anxiety   . CAD (coronary artery disease)    prior inferior MI in 2010 treated with stenting of the right PDA; had nonobstructive disease in the LAD and LCX with approximately 40% lesions. EF  normal  . Diabetes mellitus without complication ([facility])   . Hyperlipidemia   . Hypertension    white coat   . Myocardial infarction ([facility])   . Obesity     Past Surgical History:  Procedure Laterality Date  . CARDIAC CATHETERIZATION    . CORONARY ANGIOPLASTY    . KNEE SURGERY  12-2010   LEFT, meniscus . arthroscopy  . KNEE SURGERY Left 11/2014   Meniscus, medial    Social History   Social History  . Marital status: Married    Spouse name: N/A  . Number of children: 1  . Years of education: N/A   Occupational History  . former [employer]   . 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 Smoking status: Never Smoker  . Smokeless tobacco: Former Systems developer     Comment: quit chewing tobacco 2010   . Alcohol use Yes     Comment: socially   . Drug use: No  . Sexual activity: Not on file   Other Topics Concern  . Not on file   Social History Narrative   Lives w/ wife and daughter     Active martial arts         Family History  Problem Relation Age of Onset  . Hypertension Mother   . CAD Other     GGF GF   . Colon cancer Neg Hx   . Prostate cancer Neg Hx   . Diabetes Neg Hx  Medication List       Accurate as of 05/03/16 11:59 PM. Always use your most recent med list.          aspirin 81 MG tablet Take 1 tablet (81 mg total) by mouth daily.   atorvastatin 40 MG tablet Commonly known as:  LIPITOR Take 1 tablet (40 mg total) by mouth daily.   buPROPion 150 MG 12 hr tablet Commonly known as:  WELLBUTRIN SR Take 1 tablet (150 mg total) by mouth 2 (two) times daily.   metFORMIN 1000 MG tablet Commonly known as:  GLUCOPHAGE Take 1 tablet (1,000 mg total) by mouth 2 (two) times daily with a meal.   metoprolol succinate 25 MG 24 hr tablet Commonly known as:  TOPROL-XL TAKE 1 TABLET BY MOUTH ONCE DAILY   nitroGLYCERIN 0.4 MG SL tablet Commonly known as:  NITROSTAT Place 1 tablet (0.4 mg total) under the tongue every 5 (five) minutes as needed.     ONE TOUCH LANCETS Misc Check once daily.   sitaGLIPtin 100 MG tablet Commonly known as:  JANUVIA Take 1 tablet (100 mg total) by mouth daily.          Objective:   Physical Exam BP 124/78 (BP Location: Left Arm, Patient Position: Sitting, Cuff Size: Normal)   Pulse 76   Temp 98 F (36.7 C) (Oral)   Resp 12   Ht 6' (1.829 m)   Wt 254 lb 6 oz (115.4 kg)   SpO2 96%   BMI 34.50 kg/m    General:   Well developed, well nourished . NAD.  Neck: No  thyromegaly  HEENT:  Normocephalic . Face symmetric, atraumatic Lungs:  CTA B Normal respiratory effort, no intercostal retractions, no accessory muscle use. Heart: RRR,  no murmur.  No pretibial edema bilaterally  Abdomen:  Not distended, soft, non-tender. No rebound or rigidity.   Rectal:  External abnormalities: none. Normal sphincter tone. No rectal masses or tenderness.  No stools found Prostate: Prostate hard to reach, does not seem enlarged, nontender Anoscopy: Several small internal hemorrhoids without acute bleeding. Skin: Exposed areas without rash. Not pale. Not jaundice Neurologic:  alert & oriented X3.  Speech normal, gait appropriate for age and unassisted Strength symmetric and appropriate for age.  Psych: Cognition and judgment appear intact.  Cooperative with normal attention span and concentration.  Behavior appropriate. No anxious or depressed appearing.    Assessment & Plan:   Assessment DM w/ CAD HTN Hyperlipidemia Depression-Anxiety CAD, MI, angioplasty  PLAN: DM: Last A1c elevated, not exercising in the last 5 weeks due to a injury, CBGs usually 110, 130 occasionally has seen a 254 reading. Cont metformin, Januvia. Recommend a healthy lifestyle, check A1c and micro. Long-term consequences of poorly controlled diabetes discussed HTN: Controlled, continue Toprol. Hyperlipidemia: Continue Lipitor, check labs. CAD: Stable, note from cardiology reviewed Depression anxiety: Controlled on  Wellbutrin RTC 3 months.

## 2016-05-03 NOTE — Progress Notes (Signed)
Pre visit review using our clinic review tool, if applicable. No additional management support is needed unless otherwise documented below in the visit note. 

## 2016-05-04 LAB — CBC WITH DIFFERENTIAL/PLATELET
BASOS PCT: 0.5 % (ref 0.0–3.0)
Basophils Absolute: 0 10*3/uL (ref 0.0–0.1)
EOS PCT: 1.2 % (ref 0.0–5.0)
Eosinophils Absolute: 0.1 10*3/uL (ref 0.0–0.7)
HEMATOCRIT: 43.8 % (ref 39.0–52.0)
HEMOGLOBIN: 15.1 g/dL (ref 13.0–17.0)
LYMPHS PCT: 42.8 % (ref 12.0–46.0)
Lymphs Abs: 3 10*3/uL (ref 0.7–4.0)
MCHC: 34.4 g/dL (ref 30.0–36.0)
MCV: 90.4 fl (ref 78.0–100.0)
MONO ABS: 0.5 10*3/uL (ref 0.1–1.0)
MONOS PCT: 6.9 % (ref 3.0–12.0)
Neutro Abs: 3.5 10*3/uL (ref 1.4–7.7)
Neutrophils Relative %: 48.6 % (ref 43.0–77.0)
Platelets: 237 10*3/uL (ref 150.0–400.0)
RBC: 4.84 Mil/uL (ref 4.22–5.81)
RDW: 13 % (ref 11.5–15.5)
WBC: 7.1 10*3/uL (ref 4.0–10.5)

## 2016-05-04 LAB — MICROALBUMIN / CREATININE URINE RATIO
Creatinine,U: 372.3 mg/dL
MICROALB UR: 1.3 mg/dL (ref 0.0–1.9)
MICROALB/CREAT RATIO: 0.3 mg/g (ref 0.0–30.0)

## 2016-05-04 LAB — BASIC METABOLIC PANEL
BUN: 16 mg/dL (ref 6–23)
CALCIUM: 9.9 mg/dL (ref 8.4–10.5)
CO2: 24 meq/L (ref 19–32)
CREATININE: 1.19 mg/dL (ref 0.40–1.50)
Chloride: 104 mEq/L (ref 96–112)
GFR: 68.71 mL/min (ref >60.00)
Glucose, Bld: 125 mg/dL — ABNORMAL HIGH (ref 70–99)
Potassium: 4.1 mEq/L (ref 3.5–5.1)
SODIUM: 140 meq/L (ref 135–145)

## 2016-05-04 LAB — LIPID PANEL
CHOLESTEROL: 113 mg/dL (ref 0–200)
HDL: 39 mg/dL — ABNORMAL LOW (ref >39.00)
LDL Cholesterol: 58 mg/dL (ref 0–99)
NONHDL: 74.32
Total CHOL/HDL Ratio: 3
Triglycerides: 84 mg/dL (ref 0.0–149.0)
VLDL: 16.8 mg/dL (ref 0.0–40.0)

## 2016-05-04 LAB — AST: AST: 35 U/L (ref 0–37)

## 2016-05-04 LAB — PSA: PSA: 0.2 ng/mL (ref 0.10–4.00)

## 2016-05-04 LAB — ALT: ALT: 46 U/L (ref 0–53)

## 2016-05-04 LAB — HEMOGLOBIN A1C: HEMOGLOBIN A1C: 7.9 % — AB (ref 4.6–6.5)

## 2016-05-04 NOTE — Assessment & Plan Note (Signed)
DM: Last A1c elevated, not exercising in the last 5 weeks due to a injury, CBGs usually 110, 130 occasionally has seen a 254 reading. Cont metformin, Januvia. Recommend a healthy lifestyle, check A1c and micro. Long-term consequences of poorly controlled diabetes discussed HTN: Controlled, continue Toprol. Hyperlipidemia: Continue Lipitor, check labs. CAD: Stable, note from cardiology reviewed Depression anxiety: Controlled on Wellbutrin RTC 3 months.

## 2016-05-06 MED ORDER — PIOGLITAZONE HCL 30 MG PO TABS: 30.0000 mg | ORAL_TABLET | Freq: Every day | ORAL | 3 refills | Status: DC

## 2016-05-06 NOTE — Addendum Note (Signed)
Addended byDamita Dunnings D on: 05/06/2016 01:58 PM   Modules accepted: Orders

## 2016-06-03 ENCOUNTER — Encounter: Payer: Self-pay | Admitting: Gastroenterology

## 2016-06-21 ENCOUNTER — Ambulatory Visit (HOSPITAL_BASED_OUTPATIENT_CLINIC_OR_DEPARTMENT_OTHER)
Admission: RE | Admit: 2016-06-21 | Discharge: 2016-06-21 | Disposition: A | Discharge status: Home / Self Care | Payer: 59 | Source: Ambulatory Visit | Attending: Family Medicine | Admitting: Family Medicine

## 2016-06-21 ENCOUNTER — Ambulatory Visit (INDEPENDENT_AMBULATORY_CARE_PROVIDER_SITE_OTHER): Payer: 59 | Admitting: Family Medicine

## 2016-06-21 ENCOUNTER — Encounter: Payer: Self-pay | Admitting: Family Medicine

## 2016-06-21 VITALS — BP 110/70 | HR 72 | Temp 98.6°F | Ht 72.0 in | Wt 258.6 lb

## 2016-06-21 DIAGNOSIS — S92411A Displaced fracture of proximal phalanx of right great toe, initial encounter for closed fracture: Secondary | ICD-10-CM | POA: Diagnosis not present

## 2016-06-21 DIAGNOSIS — S92911A Unspecified fracture of right toe(s), initial encounter for closed fracture: Secondary | ICD-10-CM

## 2016-06-21 DIAGNOSIS — X58XXXA Exposure to other specified factors, initial encounter: Secondary | ICD-10-CM | POA: Diagnosis not present

## 2016-06-21 NOTE — Progress Notes (Signed)
Musculoskeletal Exam  Patient: William Mckenzie DOB: 1966/03/05  DOS: 06/21/2016  SUBJECTIVE:  Chief Complaint:   Chief Complaint  Patient presents with  . Toe Injury    (R) great-happened last [place] is a 50 y.o.  male for evaluation and treatment of R toe pain.   Onset:  4 days ago. 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 Someone's shin hit his great toe straight on.  Location: R foot, medial Character:  aching  Progression of issue: Largely unchanged Associated symptoms: Bruising Treatment: to date has been rest.   Neurovascular symptoms: no  ROS: Musculoskeletal/Extremities: +R treat toe/foot pain Neurologic: no numbness, tingling no weakness   Past Medical History:  Diagnosis Date  . Acute meniscal tear, medial    Left knee  . Anxiety   . CAD (coronary artery disease)    prior inferior MI in 2010 treated with stenting of the right PDA; had nonobstructive disease in the LAD and LCX with approximately 40% lesions. EF normal  . Diabetes mellitus without complication ([place])   . Hyperlipidemia   . Hypertension    white coat   . Myocardial infarction ([place])   . Obesity    Past Surgical History:  Procedure Laterality Date  . CARDIAC CATHETERIZATION    . CORONARY ANGIOPLASTY    . KNEE SURGERY  12-2010   LEFT, meniscus . arthroscopy  . KNEE SURGERY Left 11/2014   Meniscus, medial   Family History  Problem Relation Age of Onset  . Hypertension Mother   . CAD Other     GGF GF   . Colon cancer Neg Hx   . Prostate cancer Neg Hx   . Diabetes Neg Hx    Current Outpatient Prescriptions  Medication Sig Dispense Refill  . aspirin 81 MG tablet Take 1 tablet (81 mg total) by mouth daily.    Marland Kitchen atorvastatin (LIPITOR) 40 MG tablet Take 1 tablet (40 mg total) by mouth daily. 90 tablet 2  . buPROPion (WELLBUTRIN SR) 150 MG 12 hr tablet Take 1 tablet (150 mg total) by mouth 2 (two) times daily. 180 tablet 2  .  metFORMIN (GLUCOPHAGE) 1000 MG tablet Take 1 tablet (1,000 mg total) by mouth 2 (two) times daily with a meal. 180 tablet 1  . metoprolol succinate (TOPROL-XL) 25 MG 24 hr tablet TAKE 1 TABLET BY MOUTH ONCE DAILY 90 tablet 3  . nitroGLYCERIN (NITROSTAT) 0.4 MG SL tablet Place 1 tablet (0.4 mg total) under the tongue every 5 (five) minutes as needed. 25 tablet 2  . ONE TOUCH LANCETS MISC Check once daily. 200 each 3  . pioglitazone (ACTOS) 30 MG tablet Take 1 tablet (30 mg total) by mouth daily. 30 tablet 3  . sitaGLIPtin (JANUVIA) 100 MG tablet Take 1 tablet (100 mg total) by mouth daily. 90 tablet 1   No Known Allergies Social History   Social History  . Marital status: Married  . Number of children: 1   Occupational History  . former [employer]   . CUSTOMER SERVICE [employer]   Social History Main Topics  . Smoking status: Never Smoker  . Smokeless tobacco: Former Systems developer     Comment: quit chewing tobacco 2010   . Alcohol use Yes     Comment: socially   . Drug use: No   Social History Narrative   Lives w/ wife and daughter     Active martial arts  Objective:  VITAL SIGNS: BP 110/70 (BP Location: Left Arm, Patient Position: Sitting, Cuff Size: Large)   Pulse 72   Temp 98.6 F (37 C) (Oral)   Ht 6' (1.829 m)   Wt 258 lb 9.6 oz (117.3 kg)   SpO2 98%   BMI 35.07 kg/m  Constitutional: Well formed, well developed. No acute distress. Neck:  Full range of motion.   Cardiovascular: RRR, brisk cap refill, DP and TP pulses 2+ on R Thorax & Lungs:  No accessory muscle use Extremities: No clubbing. No cyanosis. No edema.  Skin: Warm. Dry. No erythema. No rash.  Musculoskeletal: R Foot.   Normal active range of motion: yes.   Normal passive range of motion: yes Tenderness to palpation: yes- medial portion of distal metatarsal on the right Deformity: no Ecchymosis: yes Gait with a limp favoring RLE Pain with resisted extension of the great toe Neurologic: Normal sensory  function. No focal deficits noted. DTR's equal and symmetry in LE's. No clonus. Psychiatric: Normal mood. Age appropriate judgment and insight. Alert & oriented x 3.    Assessment:  Toe fracture, right, closed, initial encounter - Plan: DG Foot Complete Right  Plan: Orders as above. XR unofficially shows a nondisplaced fx of the 1st prox phalanx medially, await final read. Post-op shoe given to offload pressure of the affected area. Offered crutches, but the patient declined. Motrin and Tylenol for pain, ice as needed for swelling and inflammation. Rest. Follow-up in 2 weeks to recheck fx and re-image to insure proper healing. The patient voiced understanding and agreement to the plan.   [place], DO 06/21/16  11:25 AM

## 2016-06-21 NOTE — Addendum Note (Signed)
Addended by: Harl Bowie on: 06/21/2016 04:50 PM   Modules accepted: Orders

## 2016-06-21 NOTE — Progress Notes (Signed)
Pre visit review using our clinic review tool, if applicable. No additional management support is needed unless otherwise documented below in the visit note. 

## 2016-06-22 ENCOUNTER — Other Ambulatory Visit: Payer: Self-pay | Admitting: Internal Medicine

## 2016-07-05 ENCOUNTER — Ambulatory Visit (INDEPENDENT_AMBULATORY_CARE_PROVIDER_SITE_OTHER): Payer: 59 | Admitting: Family Medicine

## 2016-07-05 ENCOUNTER — Encounter: Payer: Self-pay | Admitting: Family Medicine

## 2016-07-05 ENCOUNTER — Ambulatory Visit (HOSPITAL_BASED_OUTPATIENT_CLINIC_OR_DEPARTMENT_OTHER)
Admission: RE | Admit: 2016-07-05 | Discharge: 2016-07-05 | Disposition: A | Discharge status: Home / Self Care | Payer: 59 | Source: Ambulatory Visit | Attending: Family Medicine | Admitting: Family Medicine

## 2016-07-05 VITALS — BP 112/74 | HR 82 | Temp 98.4°F | Ht 72.0 in | Wt 259.2 lb

## 2016-07-05 DIAGNOSIS — S92911A Unspecified fracture of right toe(s), initial encounter for closed fracture: Secondary | ICD-10-CM

## 2016-07-05 DIAGNOSIS — X58XXXA Exposure to other specified factors, initial encounter: Secondary | ICD-10-CM | POA: Insufficient documentation

## 2016-07-05 NOTE — Progress Notes (Signed)
Chief Complaint  Patient presents with  . Follow-up    Right foot fracture    Pt here for f/u R foot fx. Doing well overall, still having some pain. Wearing flat-soled shoe. No numbness or tingling.  BP 112/74 (BP Location: Right Arm, Patient Position: Sitting, Cuff Size: Large)   Pulse 82   Temp 98.4 F (36.9 C) (Oral)   Ht 6' (1.829 m)   Wt 259 lb 3.2 oz (117.6 kg)   SpO2 96%   BMI 35.15 kg/m  Msk: No deformity or ecchymosis, no swelling, TTP over the prox 1st phalanx Psych: Age appropriate judgment and insight.  Closed fracture of proximal phalanx of toe of right foot - Plan: DG Foot Complete Right  2 more weeks of flat-soled shoe. Unofficial XR shows no further displacement of fx fragment. F/u in 2 weeks for final X-ray, will take out of shoe at that time.  Pt voiced understanding and agreement to the plan.  C[place]by Oyster [place], [place] 9:20 AM 07/05/16

## 2016-07-19 ENCOUNTER — Ambulatory Visit (INDEPENDENT_AMBULATORY_CARE_PROVIDER_SITE_OTHER): Payer: 59 | Admitting: Family Medicine

## 2016-07-19 ENCOUNTER — Encounter: Payer: Self-pay | Admitting: Family Medicine

## 2016-07-19 ENCOUNTER — Ambulatory Visit (HOSPITAL_BASED_OUTPATIENT_CLINIC_OR_DEPARTMENT_OTHER)
Admission: RE | Admit: 2016-07-19 | Discharge: 2016-07-19 | Disposition: A | Discharge status: Home / Self Care | Payer: 59 | Source: Ambulatory Visit | Attending: Family Medicine | Admitting: Family Medicine

## 2016-07-19 VITALS — BP 118/70 | HR 89 | Temp 98.2°F | Ht 72.0 in | Wt 262.4 lb

## 2016-07-19 DIAGNOSIS — S92414G Nondisplaced fracture of proximal phalanx of right great toe, subsequent encounter for fracture with delayed healing: Secondary | ICD-10-CM | POA: Insufficient documentation

## 2016-07-19 DIAGNOSIS — Z23 Encounter for immunization: Secondary | ICD-10-CM

## 2016-07-19 DIAGNOSIS — X58XXXD Exposure to other specified factors, subsequent encounter: Secondary | ICD-10-CM | POA: Insufficient documentation

## 2016-07-19 NOTE — Progress Notes (Signed)
Chief Complaint  Patient presents with  . Follow-up    Pr broke foot x4 weeks ago and came in to follow up    Has been in flat shoe. Pain continues to improve. No numbness or tingling. No swelling or bruising.  BP 118/70 (BP Location: Left Arm, Patient Position: Sitting, Cuff Size: Large)   Pulse 89   Temp 98.2 F (36.8 C) (Oral)   Ht 6' (1.829 m)   Wt 262 lb 6.4 oz (119 kg)   SpO2 96%   BMI 35.59 kg/m  Gen- awake, alert MSK-  Minimal TTP on the plantar surface of MTP joint on R. No swelling or erythema. 35- age appropriate judgment and insight  Closed nondisplaced fracture of proximal phalanx of right great toe with delayed healing, subsequent encounter  Orders as above. Progressing well clinically.  OK to come out of surgical shoe if pain permits.  F/u prn at this point. If he starts having worsening pain or fails to improve, will consider referral to podiatry vs ortho foot/ankle. Pt voiced understanding and agreement to the plan.  [place], [place] 8:19 AM 07/19/16

## 2016-07-19 NOTE — Patient Instructions (Signed)
I would wait 2 more weeks before trying Tae Kwon Do. When you do return, do what you can tolerate. Light workouts to start with and gradually increase activity.  Padding in the shoe may be beneficial initially when you go back to your regular shoe.

## 2016-07-19 NOTE — Progress Notes (Signed)
Pre visit review using our clinic review tool, if applicable. No additional management support is needed unless otherwise documented below in the visit note. 

## 2016-08-03 ENCOUNTER — Encounter: Payer: Self-pay | Admitting: Internal Medicine

## 2016-08-03 ENCOUNTER — Ambulatory Visit (INDEPENDENT_AMBULATORY_CARE_PROVIDER_SITE_OTHER): Payer: 59 | Admitting: Internal Medicine

## 2016-08-03 VITALS — BP 122/68 | HR 81 | Temp 98.2°F | Resp 12 | Ht 72.0 in | Wt 263.5 lb

## 2016-08-03 DIAGNOSIS — E1159 Type 2 diabetes mellitus with other circulatory complications: Secondary | ICD-10-CM | POA: Diagnosis not present

## 2016-08-03 DIAGNOSIS — S92414G Nondisplaced fracture of proximal phalanx of right great toe, subsequent encounter for fracture with delayed healing: Secondary | ICD-10-CM

## 2016-08-03 LAB — ALT: ALT: 55 U/L — AB (ref 0–53)

## 2016-08-03 LAB — AST: AST: 30 U/L (ref 0–37)

## 2016-08-03 LAB — HEMOGLOBIN A1C: HEMOGLOBIN A1C: 7.2 % — AB (ref 4.6–6.5)

## 2016-08-03 NOTE — Assessment & Plan Note (Signed)
DM: Last A1c 7.9, added Actos to metformin and Januvia. Diet with minimal improvement, unable to exercise for the last few weeks due to a fracture. Will check A1c, AST, ALT, continue same meds, strongly recommend to improve diet and go back to exercise as soon as he can. If A1c has dropped to around 7, no change  as I hope his lifestyle will improve. Toe fracture: Last x-ray showed a nonunion, he re injured  toe yesterday, refer to ortho. RTC 3-4 months

## 2016-08-03 NOTE — Progress Notes (Signed)
Subjective:    Patient ID: William Mckenzie, male    DOB: 28-Apr-1966, 50 y.o.   MRN: [id number]  DOS:  08/03/2016 Type of visit - description : rov Interval history: DM: We added Actos, good compliance, initially had nausea but that resolved. He fractured a toe, was doing better until yesterday, he reinjured, wearing a boot again.   Review of Systems Diet: Mild improvement over the last few weeks but in the last 3 days he really started to eat healthier. Unable to exercise due to recent fracture No ambulatory CBGs   Past Medical History:  Diagnosis Date  . Acute meniscal tear, medial    Left knee  . Anxiety   . CAD (coronary artery disease)    prior inferior MI in 2010 treated with stenting of the right PDA; had nonobstructive disease in the LAD and LCX with approximately 40% lesions. EF normal  . Diabetes mellitus without complication ([place])   . Hyperlipidemia   . Hypertension    white coat   . Myocardial infarction   . Obesity     Past Surgical History:  Procedure Laterality Date  . CARDIAC CATHETERIZATION    . CORONARY ANGIOPLASTY    . KNEE SURGERY  12-2010   LEFT, meniscus . arthroscopy  . KNEE SURGERY Left 11/2014   Meniscus, medial    Social History   Social History  . Marital status: Married    Spouse name: N/A  . Number of children: 1  . Years of education: N/A   Occupational History  . [employer]   . CUSTOMER SERVICE [employer]   Social History Main Topics  . Smoking status: Never Smoker  . Smokeless tobacco: Former Systems developer     Comment: quit chewing tobacco 2010   . Alcohol use Yes     Comment: socially   . Drug use: No  . Sexual activity: Not on file   Other Topics Concern  . Not on file   Social History Narrative   Lives w/ wife and daughter     Active martial arts              Medication List       Accurate as of 08/03/16  8:47 AM. Always use your most recent med list.          aspirin 81 MG tablet Take 1 tablet (81 mg  total) by mouth daily.   atorvastatin 40 MG tablet Commonly known as:  LIPITOR Take 1 tablet (40 mg total) by mouth daily.   buPROPion 150 MG 12 hr tablet Commonly known as:  WELLBUTRIN SR Take 1 tablet (150 mg total) by mouth 2 (two) times daily.   metFORMIN 1000 MG tablet Commonly known as:  GLUCOPHAGE Take 1 tablet (1,000 mg total) by mouth 2 (two) times daily with a meal.   metoprolol succinate 25 MG 24 hr tablet Commonly known as:  TOPROL-XL TAKE 1 TABLET BY MOUTH ONCE DAILY   nitroGLYCERIN 0.4 MG SL tablet Commonly known as:  NITROSTAT Place 1 tablet (0.4 mg total) under the tongue every 5 (five) minutes as needed.   ONE TOUCH LANCETS Misc Check once daily.   pioglitazone 30 MG tablet Commonly known as:  ACTOS Take 1 tablet (30 mg total) by mouth daily.   sitaGLIPtin 100 MG tablet Commonly known as:  JANUVIA Take 1 tablet (100 mg total) by mouth daily.          Objective:   Physical Exam BP 122/68 (  BP Location: Left Arm, Patient Position: Sitting, Cuff Size: Normal)   Pulse 81   Temp 98.2 F (36.8 C) (Oral)   Resp 12   Ht 6' (1.829 m)   Wt 263 lb 8 oz (119.5 kg)   SpO2 97%   BMI 35.74 kg/m  General:   Well developed, well nourished . NAD.  HEENT:  Normocephalic . Face symmetric, atraumatic Lungs:  CTA B Normal respiratory effort, no intercostal retractions, no accessory muscle use. Heart: RRR,  no murmur.  No pretibial edema bilaterally  MSK: Has a boot on Skin: Not pale. Not jaundice Neurologic:  alert & oriented X3.  Speech normal  Psych--  Cognition and judgment appear intact.  Cooperative with normal attention span and concentration.  Behavior appropriate. No anxious or depressed appearing.      Assessment & Plan:    Assessment DM w/ CAD HTN Hyperlipidemia Depression-Anxiety CAD, MI, angioplasty  PLAN: DM: Last A1c 7.9, added Actos to metformin and Januvia. Diet with minimal improvement, unable to exercise for the last few  weeks due to a fracture. Will check A1c, AST, ALT, continue same meds, strongly recommend to improve diet and go back to exercise as soon as he can. If A1c has dropped to around 7, no change  as I hope his lifestyle will improve. Toe fracture: Last x-ray showed a nonunion, he re injured  toe yesterday, refer to ortho. RTC 3-4 months

## 2016-08-03 NOTE — Patient Instructions (Signed)
GO TO THE LAB : Get the blood work     GO TO THE FRONT DESK Schedule your next appointment for a  Check up in 3-4 months  

## 2016-08-03 NOTE — Progress Notes (Signed)
Pre visit review using our clinic review tool, if applicable. No additional management support is needed unless otherwise documented below in the visit note. 

## 2016-08-06 ENCOUNTER — Other Ambulatory Visit (INDEPENDENT_AMBULATORY_CARE_PROVIDER_SITE_OTHER): Payer: Self-pay

## 2016-08-06 DIAGNOSIS — M79671 Pain in right foot: Secondary | ICD-10-CM

## 2016-08-09 ENCOUNTER — Ambulatory Visit (INDEPENDENT_AMBULATORY_CARE_PROVIDER_SITE_OTHER): Payer: 59 | Admitting: Orthopaedic Surgery

## 2016-08-09 ENCOUNTER — Ambulatory Visit (HOSPITAL_BASED_OUTPATIENT_CLINIC_OR_DEPARTMENT_OTHER)
Admission: RE | Admit: 2016-08-09 | Discharge: 2016-08-09 | Disposition: A | Discharge status: Home / Self Care | Payer: 59 | Source: Ambulatory Visit | Attending: Orthopaedic Surgery | Admitting: Orthopaedic Surgery

## 2016-08-09 DIAGNOSIS — S92413A Displaced fracture of proximal phalanx of unspecified great toe, initial encounter for closed fracture: Secondary | ICD-10-CM

## 2016-08-09 DIAGNOSIS — S92411D Displaced fracture of proximal phalanx of right great toe, subsequent encounter for fracture with routine healing: Secondary | ICD-10-CM | POA: Diagnosis not present

## 2016-08-09 DIAGNOSIS — X58XXXD Exposure to other specified factors, subsequent encounter: Secondary | ICD-10-CM | POA: Diagnosis not present

## 2016-08-09 DIAGNOSIS — M79671 Pain in right foot: Secondary | ICD-10-CM | POA: Diagnosis present

## 2016-08-09 NOTE — Progress Notes (Signed)
Office Visit Note   Patient: William Mckenzie           Date of Birth: Feb 22, 1966           MRN: [id number] Visit Date: 08/09/2016              Requested by: Colon Branch, MD [address], [place] [address] PCP: Kathlene November, MD   Assessment & Plan: Visit Diagnoses:  1. Closed fracture of proximal phalanx of great toe, initial encounter     Plan: The small fracture that he has at the corner/base of the right proximal phalanx is a minimal fracture. The joint surface itself lines up well. He has already had interval healing compared to x-rays from just to and after 3 weeks ago. At this point I have no further recommendations for him other than good blood glucose control as well as wearing for firm supportive shoes. He should also avoid high-impact aerobic activities and karate for at least 4 more weeks. He'll follow up as needed.  Follow-Up Instructions: Return if symptoms worsen or fail to improve.   Orders:  No orders of the defined types were placed in this encounter.  No orders of the defined types were placed in this encounter.     Procedures: No procedures performed   Clinical Data: No additional findings.   Subjective: Chief Complaint  Patient presents with  . Right Foot - Pain, Edema, Injury    Patient states about 6 weeks ago he broke his foot in karate. States since he's diabetic and his PCP "didn't like the way it was healing", he wanted him to be seen. xrays on canopy. States it is so swollen he feels that he is "walking on a balloon".  Mr. Guebara was referred from his primary care physician to take a second look at his right foot. He injured this foot 6 weeks ago kicking while performing karate. He sustained a fractured of his right great toe proximal phalanx. He is just limited his activity since then. He said his pain is minimal and it feels like it is healing. He has new x-rays today for comparison purposes. He is a diabetic and he  reports his hemoglobin A1c is 7.2. However it has been as high as 13. He denies peripheral neuropathy.  HPI  Review of Systems Negative for headache, shortness of breath, chest pain, fever, chills, nausea, vomiting  Objective: Vital Signs: There were no vitals taken for this visit.  Physical Exam  Ortho Exam Examination of his right foot shows minimal pain over the proximal phalanx of the great toe. There is no redness or swelling. His foot is well perfused with good capillary refill in all the digits and palpable pulses. He has he reports normal sensation and denies peripheral neuropathy. Specialty Comments:  No specialty comments available.  Imaging: Dg Foot Complete Right  Result Date: 08/09/2016 CLINICAL DATA:  Right foot pain. Injury 6 weeks ago. No new injury. EXAM: RIGHT FOOT COMPLETE - 3+ VIEW COMPARISON:  None. FINDINGS: Fracture again noted at the base of the right great toe proximal phalanx. Fracture line remains evident. No new fracture. No subluxation or dislocation. Soft tissues are intact. IMPRESSION: Fracture again noted at the base of the right great toe proximal phalanx. Electronically Signed   By: Rolm Baptise M.D.   On: 08/09/2016 09:36     PMFS History: Patient Active Problem List   Diagnosis Date Noted  . PCP NOTES >>>>>>>>>>>>>>>>>>>>>  12/18/2015  . Chest pain 11/04/2014  . Numbness of face 11/04/2014  . Angina pectoris, variant ([place]) 11/04/2014  . Annual physical exam 03/06/2013  . Type 2 diabetes mellitus with vascular disease ([place]) 11/22/2012  . Cough 10/27/2012  . Anxiety 04/16/2011  . Hyperlipidemia 03/18/2009  . CAD S/P PCI 2010 03/18/2009  . Knee pain, left 07/07/2007  . NICOTINE ADDICTION 06/09/2007  . SLEEP APNEA 11/16/2006   Past Medical History:  Diagnosis Date  . Acute meniscal tear, medial    Left knee  . Anxiety   . CAD (coronary artery disease)    prior inferior MI in 2010 treated with stenting of the right PDA; had nonobstructive  disease in the LAD and LCX with approximately 40% lesions. EF normal  . Diabetes mellitus without complication ([place])   . Hyperlipidemia   . Hypertension    white coat   . Myocardial infarction   . Obesity     Family History  Problem Relation Age of Onset  . Hypertension Mother   . CAD Other     GGF GF   . Colon cancer Neg Hx   . Prostate cancer Neg Hx   . Diabetes Neg Hx     Past Surgical History:  Procedure Laterality Date  . CARDIAC CATHETERIZATION    . CORONARY ANGIOPLASTY    . KNEE SURGERY  12-2010   LEFT, meniscus . arthroscopy  . KNEE SURGERY Left 11/2014   Meniscus, medial   Social History   Occupational History  . [employer]   . 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 Smoking status: Never Smoker  . Smokeless tobacco: Former Systems developer     Comment: quit chewing tobacco 2010   . Alcohol use Yes     Comment: socially   . Drug use: No  . Sexual activity: Not on file

## 2016-08-10 ENCOUNTER — Encounter: Payer: Self-pay | Admitting: Gastroenterology

## 2016-08-10 ENCOUNTER — Ambulatory Visit (INDEPENDENT_AMBULATORY_CARE_PROVIDER_SITE_OTHER): Payer: 59 | Admitting: Gastroenterology

## 2016-08-10 VITALS — BP 156/90 | HR 84 | Ht 71.5 in | Wt 263.5 lb

## 2016-08-10 DIAGNOSIS — Z1212 Encounter for screening for malignant neoplasm of rectum: Secondary | ICD-10-CM

## 2016-08-10 DIAGNOSIS — K921 Melena: Secondary | ICD-10-CM

## 2016-08-10 DIAGNOSIS — Z1211 Encounter for screening for malignant neoplasm of colon: Secondary | ICD-10-CM

## 2016-08-10 MED ORDER — NA SULFATE-K SULFATE-MG SULF 17.5-3.13-1.6 GM/177ML PO SOLN: 1.0000 | Freq: Once | ORAL | 0 refills | Status: AC

## 2016-08-10 NOTE — Progress Notes (Signed)
    History of Present Illness: This is a 50 year old male referred by Kathlene November, MD for the evaluation of hematochezia and CRC screening. Patient relates very infrequent episodes of small amounts of bright red bleeding on the tissue paper after bowel movements for several years. The symptoms have not changed. He has no other gastrointestinal complaints. He will underwent EGD by Dr. Gerhard Munch in September 2004 which was normal. He noted prolonged amnesia following the procedure during which she received Demerol and Versed. Denies weight loss, abdominal pain, constipation, diarrhea, change in stool caliber, melena, nausea, vomiting, dysphagia, reflux symptoms, chest pain.  Review of Systems: Pertinent positive and negative review of systems were noted in the above HPI section. All other review of systems were otherwise negative.  Current Medications, Allergies, Past Medical History, Past Surgical History, Family History and Social History were reviewed in [employer] record.  Physical Exam: General: Well developed, well nourished, no acute distress Head: Normocephalic and atraumatic Eyes:  sclerae anicteric, EOMI Ears: Normal auditory acuity Mouth: No deformity or lesions Neck: Supple, no masses or thyromegaly Lungs: Clear throughout to auscultation Heart: Regular rate and rhythm; no murmurs, rubs or bruits Abdomen: Soft, non tender and non distended. No masses, hepatosplenomegaly or hernias noted. Normal Bowel sounds Rectal: deferred to colonoscopy Musculoskeletal: Symmetrical with no gross deformities  Skin: No lesions on visible extremities Pulses:  Normal pulses noted Extremities: No clubbing, cyanosis, edema or deformities noted Neurological: Alert oriented x 4, grossly nonfocal Cervical Nodes:  No significant cervical adenopathy Inguinal Nodes: No significant inguinal adenopathy Psychological:  Alert and cooperative. Normal mood and affect  Assessment and  Recommendations:  1. CRC screening, average risk. The risks (including bleeding, perforation, infection, missed lesions, medication reactions and possible hospitalization or surgery if complications occur), benefits, and alternatives to colonoscopy with possible biopsy and possible polypectomy were discussed with the patient and they consent to proceed.   2. Small volume hematochezia. Suspected benign source such as hemorrhoids.    cc: Kathlene November, MD

## 2016-08-10 NOTE — Patient Instructions (Signed)
You have been scheduled for a colonoscopy. Please follow written instructions given to you at your visit today.  Please pick up your prep supplies at the pharmacy within the next 1-3 days. If you use inhalers (even only as needed), please bring them with you on the day of your procedure. Your physician has requested that you go to www.startemmi.com and enter the access code given to you at your visit today. This web site gives a general overview about your procedure. However, you should still follow specific instructions given to you by our office regarding your preparation for the procedure.  Normal BMI (Body Mass Index- based on height and weight) is between 19 and 25. Your BMI today is Body mass index is 36.24 kg/m. Marland Kitchen Please consider follow up  regarding your BMI with your Primary Care Provider.  Thank you for choosing me and [facility].  Pricilla Riffle. Dagoberto Ligas., MD., Marval Regal

## 2016-09-15 ENCOUNTER — Other Ambulatory Visit: Payer: Self-pay | Admitting: Internal Medicine

## 2016-09-16 ENCOUNTER — Other Ambulatory Visit: Payer: Self-pay | Admitting: Internal Medicine

## 2016-10-05 ENCOUNTER — Ambulatory Visit (HOSPITAL_BASED_OUTPATIENT_CLINIC_OR_DEPARTMENT_OTHER)
Admission: RE | Admit: 2016-10-05 | Discharge: 2016-10-05 | Disposition: A | Discharge status: Home / Self Care | Payer: 59 | Source: Ambulatory Visit | Attending: Physician Assistant | Admitting: Physician Assistant

## 2016-10-05 ENCOUNTER — Ambulatory Visit: Payer: 59 | Admitting: Internal Medicine

## 2016-10-05 ENCOUNTER — Ambulatory Visit (INDEPENDENT_AMBULATORY_CARE_PROVIDER_SITE_OTHER): Payer: 59 | Admitting: Physician Assistant

## 2016-10-05 ENCOUNTER — Encounter: Payer: Self-pay | Admitting: Physician Assistant

## 2016-10-05 VITALS — BP 118/82 | HR 116 | Temp 103.1°F | Resp 16 | Ht 71.5 in | Wt 261.0 lb

## 2016-10-05 DIAGNOSIS — J069 Acute upper respiratory infection, unspecified: Secondary | ICD-10-CM | POA: Insufficient documentation

## 2016-10-05 DIAGNOSIS — E119 Type 2 diabetes mellitus without complications: Secondary | ICD-10-CM | POA: Diagnosis not present

## 2016-10-05 MED ORDER — DOXYCYCLINE HYCLATE 100 MG PO TABS: 100.0000 mg | ORAL_TABLET | Freq: Two times a day (BID) | ORAL | 0 refills | Status: DC

## 2016-10-05 MED ORDER — BENZONATATE 100 MG PO CAPS: 100.0000 mg | ORAL_CAPSULE | Freq: Two times a day (BID) | ORAL | 0 refills | Status: DC | PRN

## 2016-10-05 NOTE — Patient Instructions (Signed)
It was nice meeting you today!  Please go to the first floor to complete your xray. We will call you with the results and our plan to treat your infection.  Please follow-up with Dr. Larose Kells if no improvement in symptoms by beginning of next week, or sooner if needed.

## 2016-10-05 NOTE — Progress Notes (Signed)
Pre visit review using our clinic review tool, if applicable. No additional management support is needed unless otherwise documented below in the visit note/SLS  

## 2016-10-05 NOTE — Progress Notes (Signed)
Subjective:    Patient ID: William Mckenzie, male    DOB: [birth year], 51 y.o.   MRN: [id number]  HPI   Mr. Lumpp is a 51 y/o male who presents with chief complaint of "cough." He complains of head & chest congestion, sinus pain & pressure, post nasal drainage, cough w/ phlegm x 2 weeks. He denies chest pain, SOB. He has had some poor appetite over the weekend. He did receive a flu shot this year as well as the PNA shot. He has taken Alka Seltzer Cold and Flu with minimal relief. He denies any sick contacts.  He has a history of DM and has been taking his medicines as scheduled. He checked his blood sugars yesterday and they were 91 and 144.  Review of Systems  See HPI  Past Medical History:  Diagnosis Date  . Acute meniscal tear, medial    Left knee  . Anxiety   . CAD (coronary artery disease)    prior inferior MI in 2010 treated with stenting of the right PDA; had nonobstructive disease in the LAD and LCX with approximately 40% lesions. EF normal  . Diabetes mellitus without complication ([place])   . Hyperlipidemia   . Hypertension    white coat   . Myocardial infarction   . Obesity      Social History   Social History  . Marital status: Married    Spouse name: N/A  . Number of children: 1  . Years of education: N/A   Occupational History  . [employer]   . CUSTOMER SERVICE [employer]   Social History Main Topics  . Smoking status: Never Smoker  . Smokeless tobacco: Former Systems developer    Types: [place] date: 09/27/2012     Comment: quit chewing tobacco 2010   . Alcohol use Yes     Comment: socially   . Drug use: No  . Sexual activity: Not on file   Other Topics Concern  . Not on file   Social History Narrative   Lives w/ wife and daughter     Active martial arts          Past Surgical History:  Procedure Laterality Date  . CARDIAC CATHETERIZATION    . CORONARY ANGIOPLASTY    . KNEE SURGERY Left 12/2010   LEFT, meniscus . arthroscopy  . KNEE  SURGERY Left 11/2014   Meniscus, medial    Family History  Problem Relation Age of Onset  . Hypertension Mother   . Heart disease Father   . CAD Other     PGGF  . Heart disease Paternal Grandfather   . Colon cancer Neg Hx   . Prostate cancer Neg Hx   . Diabetes Neg Hx     No Known Allergies  Current Outpatient Prescriptions on File Prior to Visit  Medication Sig Dispense Refill  . aspirin 81 MG tablet Take 1 tablet (81 mg total) by mouth daily.    Marland Kitchen atorvastatin (LIPITOR) 40 MG tablet Take 1 tablet (40 mg total) by mouth daily. 90 tablet 1  . buPROPion (WELLBUTRIN SR) 150 MG 12 hr tablet Take 1 tablet (150 mg total) by mouth 2 (two) times daily. 180 tablet 1  . metFORMIN (GLUCOPHAGE) 1000 MG tablet Take 1 tablet (1,000 mg total) by mouth 2 (two) times daily with a meal. 180 tablet 1  . metoprolol succinate (TOPROL-XL) 25 MG 24 hr tablet TAKE 1 TABLET BY MOUTH ONCE DAILY 90 tablet 3  .  nitroGLYCERIN (NITROSTAT) 0.4 MG SL tablet Place 1 tablet (0.4 mg total) under the tongue every 5 (five) minutes as needed. 25 tablet 2  . ONE TOUCH LANCETS MISC Check once daily. 200 each 3  . pioglitazone (ACTOS) 30 MG tablet Take 1 tablet (30 mg total) by mouth daily. 30 tablet 5  . sitaGLIPtin (JANUVIA) 100 MG tablet Take 1 tablet (100 mg total) by mouth daily. 90 tablet 1   No current facility-administered medications on file prior to visit.     BP 118/82 (BP Location: Right Arm, Patient Position: Sitting, Cuff Size: Large)   Pulse (!) 116   Temp (!) 103.1 F (39.5 C) (Oral)   Resp 16   Ht 5' 11.5" (1.816 m)   Wt 261 lb (118.4 kg)   SpO2 95%   BMI 35.89 kg/m       Objective:   Physical Exam  Constitutional: He appears well-developed. He is cooperative.  HENT:  Head: Normocephalic and atraumatic.  Right Ear: Tympanic membrane, external ear and ear canal normal. Tympanic membrane is not erythematous, not retracted and not bulging.  Left Ear: Tympanic membrane, external ear and ear  canal normal. Tympanic membrane is not erythematous, not retracted and not bulging.  Mouth/Throat: Posterior oropharyngeal erythema present. No posterior oropharyngeal edema.  Post nasal drip present  Eyes: Conjunctivae are normal.  Cardiovascular: Regular rhythm and normal heart sounds.  Tachycardia present.   Pulmonary/Chest: Effort normal and breath sounds normal. No accessory muscle usage. No respiratory distress.  Occasional bronchial breath sounds at bases; no crackles or wheezes  Lymphadenopathy:    He has no cervical adenopathy.  Neurological: He is alert.  Nursing note and vitals reviewed.       Assessment & Plan:  1. Upper respiratory tract infection, unspecified type - Given sudden onset of fevers, question if influenza vs PNA; unable to test for influenza, we are currently without rapid testing kits for the flu - Chest xray to rule out PNA; chest xray does not show PNA - Given DM will treat with Doxycycline to cover sinus/upper respiratory infection, patient is outside of treatment window for tamiflu - Rest and hydrate, Tylenol for fevers - Patient was advised to call us if symptoms worsen or do not improve  Inda Coke PA-C 10/05/16

## 2016-10-14 ENCOUNTER — Encounter: Payer: Self-pay | Admitting: Gastroenterology

## 2016-10-20 ENCOUNTER — Other Ambulatory Visit: Payer: Self-pay | Admitting: Internal Medicine

## 2016-10-20 ENCOUNTER — Encounter: Payer: 59 | Admitting: Gastroenterology

## 2016-10-21 ENCOUNTER — Other Ambulatory Visit: Payer: Self-pay | Admitting: Internal Medicine

## 2016-10-22 ENCOUNTER — Encounter: Payer: Self-pay | Admitting: Gastroenterology

## 2016-10-22 ENCOUNTER — Ambulatory Visit (AMBULATORY_SURGERY_CENTER): Payer: 59 | Admitting: Gastroenterology

## 2016-10-22 VITALS — BP 128/79 | HR 71 | Temp 98.4°F | Resp 14 | Ht 71.5 in | Wt 263.0 lb

## 2016-10-22 DIAGNOSIS — Z1211 Encounter for screening for malignant neoplasm of colon: Secondary | ICD-10-CM | POA: Diagnosis present

## 2016-10-22 DIAGNOSIS — I2510 Atherosclerotic heart disease of native coronary artery without angina pectoris: Secondary | ICD-10-CM | POA: Diagnosis not present

## 2016-10-22 DIAGNOSIS — D128 Benign neoplasm of rectum: Secondary | ICD-10-CM | POA: Diagnosis not present

## 2016-10-22 DIAGNOSIS — K621 Rectal polyp: Secondary | ICD-10-CM | POA: Diagnosis not present

## 2016-10-22 DIAGNOSIS — Z1212 Encounter for screening for malignant neoplasm of rectum: Secondary | ICD-10-CM | POA: Diagnosis not present

## 2016-10-22 DIAGNOSIS — G4733 Obstructive sleep apnea (adult) (pediatric): Secondary | ICD-10-CM | POA: Diagnosis not present

## 2016-10-22 LAB — GLUCOSE, CAPILLARY
GLUCOSE-CAPILLARY: 129 mg/dL — AB (ref 65–99)
Glucose-Capillary: 107 mg/dL — ABNORMAL HIGH (ref 65–99)

## 2016-10-22 MED ORDER — SODIUM CHLORIDE 0.9 % IV SOLN: 500.0000 mL | INTRAVENOUS | Status: DC

## 2016-10-22 NOTE — Progress Notes (Signed)
To recovery vss report to [facility]

## 2016-10-22 NOTE — Progress Notes (Signed)
Called to room to assist during endoscopic procedure.  Patient ID and intended procedure confirmed with present staff. Received instructions for my participation in the procedure from the performing physician.  

## 2016-10-22 NOTE — Op Note (Signed)
[place] Patient Name: William Mckenzie Procedure Date: 10/22/2016 1:32 PM MRN: [id number] Endoscopist: Ladene Artist , MD Age: 51 Referring MD:  Date of Birth: [birth year] Gender: Male Account #: [id number] Procedure:                Colonoscopy Indications:              Screening for colorectal malignant neoplasm Medicines:                Monitored Anesthesia Care Procedure:                Pre-Anesthesia Assessment:                           - Prior to the procedure, a History and Physical                            was performed, and patient medications and                            allergies were reviewed. The patient's tolerance of                            previous anesthesia was also reviewed. The risks                            and benefits of the procedure and the sedation                            options and risks were discussed with the patient.                            All questions were answered, and informed consent                            was obtained. Prior Anticoagulants: The patient has                            taken no previous anticoagulant or antiplatelet                            agents. ASA Grade Assessment: II - A patient with                            mild systemic disease. After reviewing the risks                            and benefits, the patient was deemed in                            satisfactory condition to undergo the procedure.                           After obtaining informed consent, the colonoscope  was passed under direct vision. Throughout the                            procedure, the patient's blood pressure, pulse, and                            oxygen saturations were monitored continuously. The                            Model PCF-H190L [id number]) scope was introduced                            through the anus and advanced to the the cecum,                            identified  by appendiceal orifice and ileocecal                            valve. The ileocecal valve, appendiceal orifice,                            and rectum were photographed. The photo of the                            retroflexed view of the rectum did not capture. The                            quality of the bowel preparation was excellent. The                            colonoscopy was performed without difficulty. The                            patient tolerated the procedure well. Scope In: 1:37:31 PM Scope Out: 1:48:01 PM Scope Withdrawal Time: 0 hours 9 minutes 6 seconds  Total Procedure Duration: 0 hours 10 minutes 30 seconds  Findings:                 The perianal and digital rectal examinations were                            normal.                           A 4 mm polyp was found in the rectum. The polyp was                            sessile. The polyp was removed with a cold biopsy                            forceps. Resection and retrieval were complete.                           Internal hemorrhoids were found during  retroflexion. The hemorrhoids were small and Grade                            I (internal hemorrhoids that do not prolapse).                           The exam was otherwise without abnormality on                            direct and retroflexion views. Complications:            No immediate complications. Estimated blood loss:                            None. Estimated Blood Loss:     Estimated blood loss: none. Impression:               - One 4 mm polyp in the rectum, removed with a cold                            biopsy forceps. Resected and retrieved.                           - Internal hemorrhoids.                           - The examination was otherwise normal on direct                            and retroflexion views. Recommendation:           - Repeat colonoscopy in 5 years if polyp is                            adenomatous  for surveillance, otherwise for 10                            years for screening.                           - Patient has a contact number available for                            emergencies. The signs and symptoms of potential                            delayed complications were discussed with the                            patient. Return to normal activities tomorrow.                            Written discharge instructions were provided to the                            patient.                           -  Resume previous diet.                           - Continue present medications.                           - Await pathology results. Ladene Artist, MD 10/22/2016 1:55:44 PM This report has been signed electronically.

## 2016-10-22 NOTE — Patient Instructions (Signed)
YOU HAD AN ENDOSCOPIC PROCEDURE TODAY AT [facility]:   Refer to the procedure report that was given to you for any specific questions about what was found during the examination.  If the procedure report does not answer your questions, please call your gastroenterologist to clarify.  If you requested that your care partner not be given the details of your procedure findings, then the procedure report has been included in a sealed envelope for you to review at your convenience later.  YOU SHOULD EXPECT: Some feelings of bloating in the abdomen. Passage of more gas than usual.  Walking can help get rid of the air that was put into your GI tract during the procedure and reduce the bloating. If you had a lower endoscopy (such as a colonoscopy or flexible sigmoidoscopy) you may notice spotting of blood in your stool or on the toilet paper. If you underwent a bowel prep for your procedure, you may not have a normal bowel movement for a few days.  Please Note:  You might notice some irritation and congestion in your nose or some drainage.  This is from the oxygen used during your procedure.  There is no need for concern and it should clear up in a day or so.  SYMPTOMS TO REPORT IMMEDIATELY:   Following lower endoscopy (colonoscopy or flexible sigmoidoscopy):  Excessive amounts of blood in the stool  Significant tenderness or worsening of abdominal pains  Swelling of the abdomen that is new, acute  Fever of 100F or higher  For urgent or emergent issues, a gastroenterologist can be reached at any hour by calling [phone].   DIET:  We do recommend a small meal at first, but then you may proceed to your regular diet.  Drink plenty of fluids but you should avoid alcoholic beverages for 24 hours.  ACTIVITY:  You should plan to take it easy for the rest of today and you should NOT DRIVE or use heavy machinery until tomorrow (because of the sedation medicines used during the test).     FOLLOW UP: Our staff will call the number listed on your records the next business day following your procedure to check on you and address any questions or concerns that you may have regarding the information given to you following your procedure. If we do not reach you, we will leave a message.  However, if you are feeling well and you are not experiencing any problems, there is no need to return our call.  We will assume that you have returned to your regular daily activities without incident.  If any biopsies were taken you will be contacted by phone or by letter within the next 1-3 weeks.  Please call us at [phone] if you have not heard about the biopsies in 3 weeks.    Await biopsy results to determined next repeat screening Colonoscopy Polyps (handout given)  Hemorrhoids (handout given)  SIGNATURES/CONFIDENTIALITY: You and/or your care partner have signed paperwork which will be entered into your electronic medical record.  These signatures attest to the fact that that the information above on your After Visit Summary has been reviewed and is understood.  Full responsibility of the confidentiality of this discharge information lies with you and/or your care-partner.

## 2016-10-25 ENCOUNTER — Telehealth: Payer: Self-pay

## 2016-10-25 NOTE — Telephone Encounter (Signed)
  Follow up Call-  Call back number 10/22/2016  Post procedure Call Back phone  # [phone]  Permission to leave phone message Yes  Some recent data might be hidden     Patient questions:  Do you have a fever, pain , or abdominal swelling? No. Pain Score  0 *  Have you tolerated food without any problems? Yes.    Have you been able to return to your normal activities? Yes.    Do you have any questions about your discharge instructions: Diet   No. Medications  No. Follow up visit  No.  Do you have questions or concerns about your Care? No.  Actions: * If pain score is 4 or above: No action needed, pain <4.

## 2016-10-29 ENCOUNTER — Encounter: Payer: Self-pay | Admitting: Gastroenterology

## 2016-12-01 ENCOUNTER — Encounter: Payer: Self-pay | Admitting: Internal Medicine

## 2016-12-01 ENCOUNTER — Ambulatory Visit (INDEPENDENT_AMBULATORY_CARE_PROVIDER_SITE_OTHER): Payer: 59 | Admitting: Internal Medicine

## 2016-12-01 VITALS — BP 126/72 | HR 69 | Temp 97.9°F | Resp 14 | Ht 72.0 in | Wt 264.2 lb

## 2016-12-01 DIAGNOSIS — E1159 Type 2 diabetes mellitus with other circulatory complications: Secondary | ICD-10-CM | POA: Diagnosis not present

## 2016-12-01 NOTE — Patient Instructions (Signed)
GO TO THE LAB : Get the blood work     GO TO THE FRONT DESK Schedule your next appointment for a  Check up in 4 to 5 months  

## 2016-12-01 NOTE — Progress Notes (Signed)
Pre visit review using our clinic review tool, if applicable. No additional management support is needed unless otherwise documented below in the visit note. 

## 2016-12-01 NOTE — Progress Notes (Signed)
Subjective:    Patient ID: William Mckenzie, male    DOB: 09-17-1966, 51 y.o.   MRN: [id number]  DOS:  12/01/2016 Type of visit - description : rov Interval history: No major concerns, good medication compliance. Check CBGs ~ 3-4 times a week. Not exercising lately. He just started to eat healthier 3 days ago, low CHO diet, states already lost 8 pounds.   Wt Readings from Last 3 Encounters:  12/01/16 264 lb 4 oz (119.9 kg)  10/22/16 263 lb (119.3 kg)  10/05/16 261 lb (118.4 kg)     Review of Systems No chest pain, has nitroglycerin, has not used it  Past Medical History:  Diagnosis Date  . Acute meniscal tear, medial    Left knee  . Anxiety   . CAD (coronary artery disease)    prior inferior MI in 2010 treated with stenting of the right PDA; had nonobstructive disease in the LAD and LCX with approximately 40% lesions. EF normal  . Diabetes mellitus without complication ([place])   . Hyperlipidemia   . Hypertension    white coat   . Myocardial infarction   . Obesity     Past Surgical History:  Procedure Laterality Date  . CARDIAC CATHETERIZATION    . CORONARY ANGIOPLASTY    . KNEE SURGERY Left 12/2010   LEFT, meniscus . arthroscopy  . KNEE SURGERY Left 11/2014   Meniscus, medial    Social History   Social History  . Marital status: Married    Spouse name: N/A  . Number of children: 1  . Years of education: N/A   Occupational History  . [employer]   . CUSTOMER SERVICE [employer]   Social History Main Topics  . Smoking status: Never Smoker  . Smokeless tobacco: Former Systems developer    Types: [place] date: 09/27/2012     Comment: quit chewing tobacco 2010   . Alcohol use Yes     Comment: socially   . Drug use: No  . Sexual activity: Not on file   Other Topics Concern  . Not on file   Social History Narrative   Lives w/ wife and daughter     Active martial arts            Allergies as of 12/01/2016   No Known Allergies     Medication List         Accurate as of 12/01/16 11:59 PM. Always use your most recent med list.          aspirin 81 MG tablet Take 1 tablet (81 mg total) by mouth daily.   atorvastatin 40 MG tablet Commonly known as:  LIPITOR Take 1 tablet (40 mg total) by mouth daily.   buPROPion 150 MG 12 hr tablet Commonly known as:  WELLBUTRIN SR Take 1 tablet (150 mg total) by mouth 2 (two) times daily.   metFORMIN 1000 MG tablet Commonly known as:  GLUCOPHAGE Take 1 tablet (1,000 mg total) by mouth 2 (two) times daily with a meal.   metoprolol succinate 25 MG 24 hr tablet Commonly known as:  TOPROL-XL TAKE 1 TABLET BY MOUTH ONCE DAILY   nitroGLYCERIN 0.4 MG SL tablet Commonly known as:  NITROSTAT Place 1 tablet (0.4 mg total) under the tongue every 5 (five) minutes as needed.   ONE TOUCH LANCETS Misc Check once daily.   pioglitazone 30 MG tablet Commonly known as:  ACTOS Take 1 tablet (30 mg total) by mouth daily.  sitaGLIPtin 100 MG tablet Commonly known as:  JANUVIA Take 1 tablet (100 mg total) by mouth daily.          Objective:   Physical Exam BP 126/72 (BP Location: Left Arm, Patient Position: Sitting, Cuff Size: Normal)   Pulse 69   Temp 97.9 F (36.6 C) (Oral)   Resp 14   Ht 6' (1.829 m)   Wt 264 lb 4 oz (119.9 kg)   SpO2 98%   BMI 35.84 kg/m  General:   Well developed, well nourished . NAD.  HEENT:  Normocephalic . Face symmetric, atraumatic Lungs:  CTA B Normal respiratory effort, no intercostal retractions, no accessory muscle use. Heart: RRR,  no murmur.  No pretibial edema bilaterally  Skin: Not pale. Not jaundice Neurologic:  alert & oriented X3.  Speech normal, gait appropriate for age and unassisted Psych--  Cognition and judgment appear intact.  Cooperative with normal attention span and concentration.  Behavior appropriate. No anxious or depressed appearing.      Assessment & Plan:   Assessment DM w/ CAD HTN Hyperlipidemia Depression-Anxiety CAD,  MI, angioplasty  PLAN: DM: Currently on Januvia, Actos and metformin, we reviewed together the last A1cs, he has not been at goal x a while,  explained patient the need to get diabetes under control to prevent more heart trouble. Will check A1c, I asked him if he is willing to adjust his medications if A1c is not under control, he is not in favor of more medication at this point, states that is determined to change his lifestyle. I encouraged him to go back to be physically active and continue with his healthier diet HTN: Controlled on metoprolol. RTC 4-5 months

## 2016-12-02 LAB — BASIC METABOLIC PANEL
BUN: 22 mg/dL (ref 6–23)
CHLORIDE: 103 meq/L (ref 96–112)
CO2: 26 meq/L (ref 19–32)
Calcium: 10.1 mg/dL (ref 8.4–10.5)
Creatinine, Ser: 1.31 mg/dL (ref 0.40–1.50)
GFR: 61.36 mL/min (ref >60.00)
Glucose, Bld: 119 mg/dL — ABNORMAL HIGH (ref 70–99)
POTASSIUM: 4 meq/L (ref 3.5–5.1)
Sodium: 140 mEq/L (ref 135–145)

## 2016-12-02 LAB — HEMOGLOBIN A1C: HEMOGLOBIN A1C: 7.2 % — AB (ref 4.6–6.5)

## 2016-12-02 NOTE — Assessment & Plan Note (Signed)
DM: Currently on Januvia, Actos and metformin, we reviewed together the last A1cs, he has not been at goal x a while,  explained patient the need to get diabetes under control to prevent more heart trouble. Will check A1c, I asked him if he is willing to adjust his medications if A1c is not under control, he is not in favor of more medication at this point, states that is determined to change his lifestyle. I encouraged him to go back to be physically active and continue with his healthier diet HTN: Controlled on metoprolol. RTC 4-5 months

## 2016-12-30 ENCOUNTER — Other Ambulatory Visit: Payer: Self-pay | Admitting: Internal Medicine

## 2017-01-02 ENCOUNTER — Other Ambulatory Visit: Payer: Self-pay | Admitting: Internal Medicine

## 2017-01-17 ENCOUNTER — Encounter: Payer: Self-pay | Admitting: Internal Medicine

## 2017-01-17 ENCOUNTER — Ambulatory Visit (INDEPENDENT_AMBULATORY_CARE_PROVIDER_SITE_OTHER): Payer: 59 | Admitting: Internal Medicine

## 2017-01-17 ENCOUNTER — Ambulatory Visit (HOSPITAL_BASED_OUTPATIENT_CLINIC_OR_DEPARTMENT_OTHER)
Admission: RE | Admit: 2017-01-17 | Discharge: 2017-01-17 | Disposition: A | Discharge status: Home / Self Care | Payer: 59 | Source: Ambulatory Visit | Attending: Internal Medicine | Admitting: Internal Medicine

## 2017-01-17 VITALS — BP 132/78 | HR 72 | Temp 98.1°F | Resp 14 | Ht 72.0 in | Wt 268.2 lb

## 2017-01-17 DIAGNOSIS — S6992XA Unspecified injury of left wrist, hand and finger(s), initial encounter: Secondary | ICD-10-CM | POA: Diagnosis not present

## 2017-01-17 DIAGNOSIS — M79645 Pain in left finger(s): Secondary | ICD-10-CM | POA: Insufficient documentation

## 2017-01-17 NOTE — Progress Notes (Signed)
Pre visit review using our clinic review tool, if applicable. No additional management support is needed unless otherwise documented below in the visit note. 

## 2017-01-17 NOTE — Progress Notes (Signed)
Subjective:    Patient ID: William Mckenzie, male    DOB: [birth year], 51 y.o.   MRN: [id number]  DOS:  01/17/2017 Type of visit - description : acute Interval history:  2 days ago, was working in his yard, had an accidental fall from 2 feet high. Landed on the left, apparently hyperextended his left thumb. No bleeding. Pain is located at the base of the thumb, compared to 2 days ago slightly better today. Pain increase with flexion and extension of the thumb  Review of Systems Denies any neck pain, back pain or headache.  Past Medical History:  Diagnosis Date  . Acute meniscal tear, medial    Left knee  . Anxiety   . CAD (coronary artery disease)    prior inferior MI in 2010 treated with stenting of the right PDA; had nonobstructive disease in the LAD and LCX with approximately 40% lesions. EF normal  . Diabetes mellitus without complication ([place])   . Hyperlipidemia   . Hypertension    white coat   . Myocardial infarction ([place])   . Obesity     Past Surgical History:  Procedure Laterality Date  . CARDIAC CATHETERIZATION    . CORONARY ANGIOPLASTY    . KNEE SURGERY Left 12/2010   LEFT, meniscus . arthroscopy  . KNEE SURGERY Left 11/2014   Meniscus, medial    Social History   Social History  . Marital status: Married    Spouse name: N/A  . Number of children: 1  . Years of education: N/A   Occupational History  . former [employer]   . CUSTOMER SERVICE [employer]   Social History Main Topics  . Smoking status: Never Smoker  . Smokeless tobacco: Former Systems developer    Types: [place] date: 09/27/2012     Comment: quit chewing tobacco 2010   . Alcohol use Yes     Comment: socially   . Drug use: No  . Sexual activity: Not on file   Other Topics Concern  . Not on file   Social History Narrative   Lives w/ wife and daughter     Active martial arts            Allergies as of 01/17/2017   No Known Allergies     Medication List       Accurate as of  01/17/17  1:25 PM. Always use your most recent med list.          aspirin 81 MG tablet Take 1 tablet (81 mg total) by mouth daily.   atorvastatin 40 MG tablet Commonly known as:  LIPITOR Take 1 tablet (40 mg total) by mouth daily.   buPROPion 150 MG 12 hr tablet Commonly known as:  WELLBUTRIN SR Take 1 tablet (150 mg total) by mouth 2 (two) times daily.   metFORMIN 1000 MG tablet Commonly known as:  GLUCOPHAGE Take 1 tablet (1,000 mg total) by mouth 2 (two) times daily with a meal.   metoprolol succinate 25 MG 24 hr tablet Commonly known as:  TOPROL-XL TAKE 1 TABLET BY MOUTH ONCE DAILY   nitroGLYCERIN 0.4 MG SL tablet Commonly known as:  NITROSTAT Place 1 tablet (0.4 mg total) under the tongue every 5 (five) minutes as needed.   ONE TOUCH LANCETS Misc Check once daily.   pioglitazone 30 MG tablet Commonly known as:  ACTOS Take 1 tablet (30 mg total) by mouth daily.   sitaGLIPtin 100 MG tablet Commonly known as:  JANUVIA Take 1 tablet (100 mg total) by mouth daily.          Objective:   Physical Exam BP 132/78 (BP Location: Right Arm, Patient Position: Sitting, Cuff Size: Normal)   Pulse 72   Temp 98.1 F (36.7 C) (Oral)   Resp 14   Ht 6' (1.829 m)   Wt 268 lb 4 oz (121.7 kg)   SpO2 96%   BMI 36.38 kg/m  General:   Well developed, well nourished . NAD.  HEENT:  Normocephalic . Face symmetric, atraumatic MSK: Right hand and wrist normal Left wrist normal to inspection, palpation, full range of motion. Left thumb: No deformities, range of motion normal, passive and active flexion extension hurt, mostly at the base of the thumb. Skin: Not pale. Not jaundice Neurologic:  alert & oriented X3.  Speech normal, gait appropriate for age and unassisted Psych--  Cognition and judgment appear intact.  Cooperative with normal attention span and concentration.  Behavior appropriate. No anxious or depressed appearing.      Assessment & Plan:  Assessment DM  w/ CAD HTN Hyperlipidemia Depression-Anxiety CAD, MI, angioplasty  PLAN: Thumb injury: Suspect soft tissue/tendon injury due to hyper extension of the thumb. Will get a x-ray, eyes, thumb spica for 7-10 days. If not better to let me know, we may need to get the scaphoid x-ray or refer him. Okay to use Motrin or Tylenol, GI precautions discussed

## 2017-01-17 NOTE — Assessment & Plan Note (Signed)
Thumb injury: Suspect soft tissue/tendon injury due to hyper extension of the thumb. Will get a x-ray, eyes, thumb spica for 7-10 days. If not better to let me know, we may need to get the scaphoid x-ray or refer him. Okay to use Motrin or Tylenol, GI precautions discussed

## 2017-01-17 NOTE — Patient Instructions (Addendum)
Get your x-ray  ICE today, tomorrow  IBUPROFEN (Advil or Motrin) 200 mg 2 tablets every 6 hours as needed for pain.  Always take it with food because may cause gastritis and ulcers.  If you notice nausea, stomach pain, change in the color of stools --->  Stop the medicine and let us know  Tylenol  500 mg OTC 2 tabs a day every 8 hours as needed for pain   Get a thumb spica  Call if not gradually back to normal in the next 7-10 days. May need additional x-rays

## 2017-03-15 ENCOUNTER — Other Ambulatory Visit: Payer: Self-pay | Admitting: Internal Medicine

## 2017-04-18 ENCOUNTER — Other Ambulatory Visit: Payer: Self-pay | Admitting: Internal Medicine

## 2017-04-25 ENCOUNTER — Other Ambulatory Visit: Payer: Self-pay | Admitting: Internal Medicine

## 2017-04-26 ENCOUNTER — Other Ambulatory Visit: Payer: Self-pay | Admitting: Cardiovascular Disease

## 2017-04-26 DIAGNOSIS — I2510 Atherosclerotic heart disease of native coronary artery without angina pectoris: Secondary | ICD-10-CM

## 2017-04-26 DIAGNOSIS — E785 Hyperlipidemia, unspecified: Secondary | ICD-10-CM

## 2017-04-26 DIAGNOSIS — I10 Essential (primary) hypertension: Secondary | ICD-10-CM

## 2017-04-27 DIAGNOSIS — N200 Calculus of kidney: Secondary | ICD-10-CM | POA: Diagnosis not present

## 2017-04-27 DIAGNOSIS — R112 Nausea with vomiting, unspecified: Secondary | ICD-10-CM | POA: Diagnosis not present

## 2017-04-27 DIAGNOSIS — R102 Pelvic and perineal pain: Secondary | ICD-10-CM | POA: Diagnosis not present

## 2017-05-12 LAB — HM DIABETES EYE EXAM

## 2017-05-14 ENCOUNTER — Other Ambulatory Visit: Payer: Self-pay | Admitting: Internal Medicine

## 2017-05-14 ENCOUNTER — Other Ambulatory Visit: Payer: Self-pay | Admitting: Family Medicine

## 2017-06-25 ENCOUNTER — Other Ambulatory Visit: Payer: Self-pay | Admitting: Family Medicine

## 2017-06-30 ENCOUNTER — Other Ambulatory Visit: Payer: Self-pay | Admitting: Internal Medicine

## 2017-07-05 ENCOUNTER — Other Ambulatory Visit: Payer: Self-pay | Admitting: Cardiovascular Disease

## 2017-07-05 DIAGNOSIS — E785 Hyperlipidemia, unspecified: Secondary | ICD-10-CM

## 2017-07-05 DIAGNOSIS — I10 Essential (primary) hypertension: Secondary | ICD-10-CM

## 2017-07-05 DIAGNOSIS — I2510 Atherosclerotic heart disease of native coronary artery without angina pectoris: Secondary | ICD-10-CM

## 2017-07-23 ENCOUNTER — Other Ambulatory Visit: Payer: Self-pay | Admitting: Internal Medicine

## 2017-08-04 ENCOUNTER — Other Ambulatory Visit: Payer: Self-pay | Admitting: Cardiovascular Disease

## 2017-08-04 DIAGNOSIS — E785 Hyperlipidemia, unspecified: Secondary | ICD-10-CM

## 2017-08-04 DIAGNOSIS — I10 Essential (primary) hypertension: Secondary | ICD-10-CM

## 2017-08-04 DIAGNOSIS — I2510 Atherosclerotic heart disease of native coronary artery without angina pectoris: Secondary | ICD-10-CM

## 2017-08-10 ENCOUNTER — Other Ambulatory Visit: Payer: Self-pay | Admitting: Internal Medicine

## 2017-08-25 ENCOUNTER — Other Ambulatory Visit: Payer: Self-pay | Admitting: Cardiovascular Disease

## 2017-08-25 DIAGNOSIS — I10 Essential (primary) hypertension: Secondary | ICD-10-CM

## 2017-08-25 DIAGNOSIS — E785 Hyperlipidemia, unspecified: Secondary | ICD-10-CM

## 2017-08-25 DIAGNOSIS — I2510 Atherosclerotic heart disease of native coronary artery without angina pectoris: Secondary | ICD-10-CM

## 2017-08-26 ENCOUNTER — Other Ambulatory Visit: Payer: Self-pay | Admitting: Internal Medicine

## 2017-09-08 ENCOUNTER — Other Ambulatory Visit: Payer: Self-pay | Admitting: Cardiovascular Disease

## 2017-09-08 DIAGNOSIS — I2510 Atherosclerotic heart disease of native coronary artery without angina pectoris: Secondary | ICD-10-CM

## 2017-09-08 DIAGNOSIS — E785 Hyperlipidemia, unspecified: Secondary | ICD-10-CM

## 2017-09-08 DIAGNOSIS — I10 Essential (primary) hypertension: Secondary | ICD-10-CM

## 2017-09-09 ENCOUNTER — Other Ambulatory Visit: Payer: Self-pay | Admitting: Internal Medicine

## 2017-09-29 ENCOUNTER — Other Ambulatory Visit: Payer: Self-pay | Admitting: Internal Medicine

## 2017-10-01 ENCOUNTER — Other Ambulatory Visit: Payer: Self-pay | Admitting: Internal Medicine

## 2017-10-04 ENCOUNTER — Other Ambulatory Visit: Payer: Self-pay | Admitting: Cardiovascular Disease

## 2017-10-04 ENCOUNTER — Other Ambulatory Visit: Payer: Self-pay | Admitting: Internal Medicine

## 2017-10-04 DIAGNOSIS — I10 Essential (primary) hypertension: Secondary | ICD-10-CM

## 2017-10-04 DIAGNOSIS — I2510 Atherosclerotic heart disease of native coronary artery without angina pectoris: Secondary | ICD-10-CM

## 2017-10-04 DIAGNOSIS — E785 Hyperlipidemia, unspecified: Secondary | ICD-10-CM

## 2017-10-17 ENCOUNTER — Other Ambulatory Visit: Payer: Self-pay | Admitting: Internal Medicine

## 2017-10-30 ENCOUNTER — Other Ambulatory Visit: Payer: Self-pay | Admitting: Internal Medicine

## 2017-11-06 ENCOUNTER — Other Ambulatory Visit: Payer: Self-pay | Admitting: Internal Medicine

## 2017-11-07 ENCOUNTER — Other Ambulatory Visit: Payer: Self-pay

## 2017-11-07 MED ORDER — METFORMIN HCL 1000 MG PO TABS: 1000.0000 mg | ORAL_TABLET | Freq: Two times a day (BID) | ORAL | 0 refills | Status: DC

## 2018-01-27 ENCOUNTER — Telehealth: Payer: Self-pay | Admitting: Cardiovascular Disease

## 2018-01-27 NOTE — Telephone Encounter (Signed)
New message      *STAT* If patient is at the pharmacy, call can be transferred to refill team.   1. Which medications need to be refilled? (please list name of each medication and dose if known)  atorvastatin (LIPITOR) 40 MG tablet Take 1 tablet (40 mg total) by mouth daily.   buPROPion (WELLBUTRIN SR) 150 MG 12 hr tablet Take 1 tablet (150 mg total) by mouth 2 (two) times daily.      metoprolol succinate (TOPROL-XL) 25 MG 24 hr tablet Take 1 tablet (25 mg total) daily by mouth. Please make overdue appt with Dr. Burt Knack before anymore refills. 3rd and Final Attempt           2. Which pharmacy/location (including street and city if local pharmacy) is medication to be sent to?  [facility]   3. Do they need a 30 day or 90 day supply?  Has appt 01/30/18 with Vin b

## 2018-01-27 NOTE — Telephone Encounter (Signed)
Pt called back and stated that he would just wait until Monday when he comes in to see the provider instead of getting enough medication to last until Monday, 01/30/18. I inform the pt that if he has any other problems, questions or concerns to call the office. Pt verbalized understanding.

## 2018-01-27 NOTE — Telephone Encounter (Signed)
Called pt and left message for pt to call back to discuss his medication refills, since pt has an appt on Monday, 01/30/18.

## 2018-01-30 ENCOUNTER — Ambulatory Visit (INDEPENDENT_AMBULATORY_CARE_PROVIDER_SITE_OTHER): Payer: 59 | Admitting: Physician Assistant

## 2018-01-30 ENCOUNTER — Encounter: Payer: Self-pay | Admitting: Physician Assistant

## 2018-01-30 VITALS — BP 128/76 | HR 79 | Ht 72.0 in | Wt 264.4 lb

## 2018-01-30 DIAGNOSIS — I10 Essential (primary) hypertension: Secondary | ICD-10-CM | POA: Diagnosis not present

## 2018-01-30 DIAGNOSIS — E785 Hyperlipidemia, unspecified: Secondary | ICD-10-CM

## 2018-01-30 DIAGNOSIS — I2510 Atherosclerotic heart disease of native coronary artery without angina pectoris: Secondary | ICD-10-CM | POA: Diagnosis not present

## 2018-01-30 MED ORDER — METOPROLOL SUCCINATE ER 25 MG PO TB24: 25.0000 mg | ORAL_TABLET | Freq: Every day | ORAL | 3 refills | Status: DC

## 2018-01-30 MED ORDER — NITROGLYCERIN 0.4 MG SL SUBL: 0.4000 mg | SUBLINGUAL_TABLET | SUBLINGUAL | 2 refills | Status: DC | PRN

## 2018-01-30 MED ORDER — ATORVASTATIN CALCIUM 40 MG PO TABS: 40.0000 mg | ORAL_TABLET | Freq: Every day | ORAL | 3 refills | Status: DC

## 2018-01-30 NOTE — Progress Notes (Signed)
Cardiology Office Note    Date:  01/30/2018   ID:  William Mckenzie, DOB [birth year], MRN [id number]  PCP:  Colon Branch, MD  Cardiologist:  Dr. Burt Knack   Chief Complaint: 18 months follow up   History of Present Illness:   William Mckenzie is a 52 y.o. male with hx of CAD, HTN, HLD and DM presents for follow up.   The patient initially presented with an inferior wall MI in 2010. He was treated with primary PCI of the PDA branch with a drug-eluting stent.  The patient was hospitalized in February 2016 with back pain and nausea. Symptoms were similar to those of his MI. He ruled out by serial enzymes. A nuclear scan demonstrated scar without ischemia. An echocardiogram showed normal LV systolic function with an ejection fraction of 55-60%. No further cardiac evaluation was recommended at that time.  Last seen by Dr. Burt Knack 04/2016.  Here today for follow-up.  Exercise 3-4 times per week without any angina or dyspnea.  No orthopnea, PND, syncope, lower extremity edema or melena.  Compliant with medication.  His Lipitor managed by PCP.   Past Medical History:  Diagnosis Date  . Acute meniscal tear, medial    Left knee  . Anxiety   . CAD (coronary artery disease)    prior inferior MI in 2010 treated with stenting of the right PDA; had nonobstructive disease in the LAD and LCX with approximately 40% lesions. EF normal  . Diabetes mellitus without complication ([place])   . Hyperlipidemia   . Hypertension    white coat   . Myocardial infarction ([place])   . Obesity     Past Surgical History:  Procedure Laterality Date  . CARDIAC CATHETERIZATION    . CORONARY ANGIOPLASTY    . KNEE SURGERY Left 12/2010   LEFT, meniscus . arthroscopy  . KNEE SURGERY Left 11/2014   Meniscus, medial    Current Medications: Prior to Admission medications   Medication Sig Start Date End Date Taking? Authorizing Provider  aspirin 81 MG tablet Take 1 tablet (81 mg total) by mouth daily.  01/08/13   Sherren Mocha, MD  atorvastatin (LIPITOR) 40 MG tablet Take 1 tablet (40 mg total) by mouth daily. 05/16/17   Colon Branch, MD  buPROPion [facility] SR) 150 MG 12 hr tablet Take 1 tablet (150 mg total) by mouth 2 (two) times daily. 11/07/17   Colon Branch, MD  metFORMIN (GLUCOPHAGE) 1000 MG tablet Take 1 tablet (1,000 mg total) by mouth 2 (two) times daily with a meal. 11/07/17   Colon Branch, MD  metoprolol succinate (TOPROL-XL) 25 MG 24 hr tablet Take 1 tablet (25 mg total) daily by mouth. Please make overdue appt with Dr. Burt Knack before anymore refills. 3rd and Final Attempt 08/04/17   Sherren Mocha, MD  nitroGLYCERIN (NITROSTAT) 0.4 MG SL tablet Place 1 tablet (0.4 mg total) under the tongue every 5 (five) minutes as needed. Patient not taking: Reported on 12/01/2016 12/05/14   Sherren Mocha, MD  ONE [facility] LANCETS MISC Check once daily. Patient not taking: Reported on 12/01/2016 11/22/12   Colon Branch, MD  pioglitazone (ACTOS) 30 MG tablet Take 1 tablet (30 mg total) by mouth daily. 09/09/17   Colon Branch, MD  sitaGLIPtin (JANUVIA) 100 MG tablet Take 1 tablet (100 mg total) by mouth daily. 12/22/15   Colon Branch, MD    Allergies:   Patient has no known allergies.   Social History  Socioeconomic History  . Marital status: Married    Spouse name: Not on file  . Number of children: 1  . Years of education: Not on file  . Highest education level: Not on file  Occupational History  . Occupation: former Corporate treasurer  . Occupation: CUSTOMER SERVICE [employer]    Employer: [employer]  Social Needs  . Financial resource strain: Not on file  . Food insecurity:    Worry: Not on file    Inability: Not on file  . Transportation needs:    Medical: Not on file    Non-medical: Not on file  Tobacco Use  . Smoking status: Never Smoker  . Smokeless tobacco: Former Systems developer    Types: Chew  . Tobacco comment: quit chewing tobacco 2010   Substance and Sexual Activity  . Alcohol use: Yes    Comment: socially   .  Drug use: No  . Sexual activity: Not on file  Lifestyle  . Physical activity:    Days per week: Not on file    Minutes per session: Not on file  . Stress: Not on file  Relationships  . Social connections:    Talks on phone: Not on file    Gets together: Not on file    Attends religious service: Not on file    Active member of club or organization: Not on file    Attends meetings of clubs or organizations: Not on file    Relationship status: Not on file  Other Topics Concern  . Not on file  Social History Narrative   Lives w/ wife and daughter     Active martial arts        Family History:  The patient's family history includes CAD in his other; Heart disease in his father and paternal grandfather; Hypertension in his mother.  ROS:   Please see the history of present illness.    ROS All other systems reviewed and are negative.   PHYSICAL EXAM:   VS:  BP 128/76   Pulse 79   Ht 6' (1.829 m)   Wt 264 lb 6.4 oz (119.9 kg)   SpO2 95%   BMI 35.86 kg/m    GEN: Well nourished, well developed, in no acute distress  HEENT: normal  Neck: no JVD, carotid bruits, or masses Cardiac:RRR; no murmurs, rubs, or gallops,no edema  Respiratory:  clear to auscultation bilaterally, normal work of breathing GI: soft, nontender, nondistended, + BS MS: no deformity or atrophy  Skin: warm and dry, no rash Neuro:  Alert and Oriented x 3, Strength and sensation are intact Psych: euthymic mood, full affect  Wt Readings from Last 3 Encounters:  01/30/18 264 lb 6.4 oz (119.9 kg)  01/17/17 268 lb 4 oz (121.7 kg)  12/01/16 264 lb 4 oz (119.9 kg)      Studies/Labs Reviewed:   EKG:  EKG is ordered today.  The ekg ordered today demonstrates normal sinus rhythm at rate of 79 bpm  Recent Labs: No results found for requested labs within last 8760 hours.   Lipid Panel    Component Value Date/Time   CHOL 113 05/03/2016 1500   TRIG 84.0 05/03/2016 1500   HDL 39.00 (L) 05/03/2016 1500    CHOLHDL 3 05/03/2016 1500   VLDL 16.8 05/03/2016 1500   LDLCALC 58 05/03/2016 1500   LDLDIRECT 179.4 11/21/2007 1156    Additional studies/ records that were reviewed today include:   Myoview 11/05/14 IMPRESSION: 1. Moderate sized, medium intensity fixed defect in  the inferior wall consistent with diaphragmatic attenuation. No reversible ischemia or infarction. 2. Mildly reduced left ventricular wall motion. 3. Left ventricular ejection fraction 44% 4. Intermediate-risk stress test findings*.  Echo 11/05/14 - EF 55-60%. Wall motion was normal - Right ventricle: The cavity size was normal. Systolic function was normal.  Cardiac Cath/PCI 02/2009 LAD: prox 40%, mid 40% LCx: Mid 40% RCA: PCA 100% >>PCI: 2.5- x 58mm Xience DES EF: 55% with inf-apical AK    ASSESSMENT & PLAN:    1. CAD -No angina or dyspnea.  Continue exercise regimen.  Continue aspirin, Lipitor and beta-blocker.  2. HTN -Well-controlled on current medication.  3. HLD -No results found for requested labs within last 8760 hours.  Managed by PCP.  Continue Lipitor 40 mg daily.  4. DM -Followed by PCP.   Medication Adjustments/Labs and Tests Ordered: Current medicines are reviewed at length with the patient today.  Concerns regarding medicines are outlined above.  Medication changes, Labs and Tests ordered today are listed in the Patient Instructions below. There are no Patient Instructions on file for this visit.   Jarrett Soho, [place]  01/30/2018 9:32 AM    [facility] [place], [place], [place]  [address] Phone: [phone]; Fax: [phone]

## 2018-01-30 NOTE — Patient Instructions (Signed)
Medication Instructions:  1. REFILLS HAVE BEEN SENT IN FOR NITROGLYCERIN, METOPROLOL AND LIPITOR  Labwork: NONE ORDERED TODAY  Testing/Procedures: NONE ORDERED TODAY  Follow-Up: Your physician wants you to follow-up in: Rafael Capo DR. Emelda Fear will receive a reminder letter in the mail two months in advance. If you don't receive a letter, please call our office to schedule the follow-up appointment.   Any Other Special Instructions Will Be Listed Below (If Applicable).     If you need a refill on your cardiac medications before your next appointment, please call your pharmacy.

## 2018-02-01 ENCOUNTER — Encounter: Payer: Self-pay | Admitting: Internal Medicine

## 2018-02-01 ENCOUNTER — Ambulatory Visit (INDEPENDENT_AMBULATORY_CARE_PROVIDER_SITE_OTHER): Payer: 59 | Admitting: Internal Medicine

## 2018-02-01 VITALS — BP 128/68 | HR 82 | Temp 98.1°F | Resp 14 | Ht 72.0 in | Wt 261.4 lb

## 2018-02-01 DIAGNOSIS — E1159 Type 2 diabetes mellitus with other circulatory complications: Secondary | ICD-10-CM

## 2018-02-01 DIAGNOSIS — F32A Depression, unspecified: Secondary | ICD-10-CM

## 2018-02-01 DIAGNOSIS — E785 Hyperlipidemia, unspecified: Secondary | ICD-10-CM | POA: Diagnosis not present

## 2018-02-01 DIAGNOSIS — F419 Anxiety disorder, unspecified: Secondary | ICD-10-CM | POA: Diagnosis not present

## 2018-02-01 DIAGNOSIS — F329 Major depressive disorder, single episode, unspecified: Secondary | ICD-10-CM

## 2018-02-01 LAB — BASIC METABOLIC PANEL
BUN: 15 mg/dL (ref 6–23)
CALCIUM: 9.2 mg/dL (ref 8.4–10.5)
CO2: 29 meq/L (ref 19–32)
CREATININE: 1.11 mg/dL (ref 0.40–1.50)
Chloride: 106 mEq/L (ref 96–112)
GFR: 73.94 mL/min (ref >60.00)
GLUCOSE: 197 mg/dL — AB (ref 70–99)
Potassium: 4.7 mEq/L (ref 3.5–5.1)
Sodium: 141 mEq/L (ref 135–145)

## 2018-02-01 LAB — LIPID PANEL
Cholesterol: 150 mg/dL (ref 0–200)
HDL: 30.4 mg/dL — ABNORMAL LOW (ref >39.00)
LDL CALC: 85 mg/dL (ref 0–99)
NonHDL: 119.77
Total CHOL/HDL Ratio: 5
Triglycerides: 175 mg/dL — ABNORMAL HIGH (ref 0.0–149.0)
VLDL: 35 mg/dL (ref 0.0–40.0)

## 2018-02-01 LAB — MICROALBUMIN / CREATININE URINE RATIO
CREATININE, U: 247 mg/dL
MICROALB UR: 1.2 mg/dL (ref 0.0–1.9)
MICROALB/CREAT RATIO: 0.5 mg/g (ref 0.0–30.0)

## 2018-02-01 LAB — ALT: ALT: 37 U/L (ref 0–53)

## 2018-02-01 LAB — AST: AST: 23 U/L (ref 0–37)

## 2018-02-01 LAB — HEMOGLOBIN A1C: Hgb A1c MFr Bld: 7.6 % — ABNORMAL HIGH (ref 4.6–6.5)

## 2018-02-01 MED ORDER — BUPROPION HCL ER (SR) 150 MG PO TB12: 150.0000 mg | ORAL_TABLET | Freq: Two times a day (BID) | ORAL | 1 refills | Status: DC

## 2018-02-01 MED ORDER — PIOGLITAZONE HCL 30 MG PO TABS: 30.0000 mg | ORAL_TABLET | Freq: Every day | ORAL | 1 refills | Status: DC

## 2018-02-01 MED ORDER — METFORMIN HCL 1000 MG PO TABS: 1000.0000 mg | ORAL_TABLET | Freq: Two times a day (BID) | ORAL | 1 refills | Status: DC

## 2018-02-01 MED ORDER — SITAGLIPTIN PHOSPHATE 100 MG PO TABS: 100.0000 mg | ORAL_TABLET | Freq: Every day | ORAL | 1 refills | Status: DC

## 2018-02-01 NOTE — Progress Notes (Signed)
Pre visit review using our clinic review tool, if applicable. No additional management support is needed unless otherwise documented below in the visit note. 

## 2018-02-01 NOTE — Progress Notes (Signed)
Subjective:    Patient ID: William Mckenzie, male    DOB: September 28, 1965, 52 y.o.   MRN: [id number]  DOS:  02/01/2018 Type of visit - description : rov Interval history: DM: Has changed his diet significantly but only for the last 3 weeks.  Has lost 17 pounds according to his scales. Cholesterol: Good compliance with medication except for the last 10 days when he ran out Depression: Controlled, needs a refill.  Wt Readings from Last 3 Encounters:  02/01/18 261 lb 6 oz (118.6 kg)  01/30/18 264 lb 6.4 oz (119.9 kg)  01/17/17 268 lb 4 oz (121.7 kg)     Review of Systems No chest pain, difficulty breathing. No symptoms consistent with low sugar No lower extremity paresthesias  Past Medical History:  Diagnosis Date  . Acute meniscal tear, medial    Left knee  . Anxiety   . CAD (coronary artery disease)    prior inferior MI in 2010 treated with stenting of the right PDA; had nonobstructive disease in the LAD and LCX with approximately 40% lesions. EF normal  . Diabetes mellitus without complication ([place])   . Hyperlipidemia   . Hypertension    white coat   . Myocardial infarction ([place])   . Obesity     Past Surgical History:  Procedure Laterality Date  . CARDIAC CATHETERIZATION    . CORONARY ANGIOPLASTY    . KNEE SURGERY Left 12/2010   LEFT, meniscus . arthroscopy  . KNEE SURGERY Left 11/2014   Meniscus, medial    Social History   Socioeconomic History  . Marital status: Married    Spouse name: Not on file  . Number of children: 1  . Years of education: Not on file  . Highest education level: Not on file  Occupational History  . Occupation: former Corporate treasurer  . Occupation: CUSTOMER SERVICE [employer]    Employer: [employer]  Social Needs  . Financial resource strain: Not on file  . Food insecurity:    Worry: Not on file    Inability: Not on file  . Transportation needs:    Medical: Not on file    Non-medical: Not on file  Tobacco Use  . Smoking status: Never Smoker  .  Smokeless tobacco: Former Systems developer    Types: Chew  . Tobacco comment: quit chewing tobacco 2010   Substance and Sexual Activity  . Alcohol use: Yes    Comment: socially   . Drug use: No  . Sexual activity: Not on file  Lifestyle  . Physical activity:    Days per week: Not on file    Minutes per session: Not on file  . Stress: Not on file  Relationships  . Social connections:    Talks on phone: Not on file    Gets together: Not on file    Attends religious service: Not on file    Active member of club or organization: Not on file    Attends meetings of clubs or organizations: Not on file    Relationship status: Not on file  . Intimate partner violence:    Fear of current or ex partner: Not on file    Emotionally abused: Not on file    Physically abused: Not on file    Forced sexual activity: Not on file  Other Topics Concern  . Not on file  Social History Narrative   Lives w/ wife and daughter     Active martial arts  Allergies as of 02/01/2018   No Known Allergies     Medication List        Accurate as of 02/01/18  5:21 PM. Always use your most recent med list.          aspirin 81 MG tablet Take 1 tablet (81 mg total) by mouth daily.   atorvastatin 40 MG tablet Commonly known as:  LIPITOR Take 1 tablet (40 mg total) by mouth daily.   buPROPion 150 MG 12 hr tablet Commonly known as:  WELLBUTRIN SR Take 1 tablet (150 mg total) by mouth 2 (two) times daily.   metFORMIN 1000 MG tablet Commonly known as:  GLUCOPHAGE Take 1 tablet (1,000 mg total) by mouth 2 (two) times daily with a meal.   metoprolol succinate 25 MG 24 hr tablet Commonly known as:  TOPROL-XL Take 1 tablet (25 mg total) by mouth daily.   nitroGLYCERIN 0.4 MG SL tablet Commonly known as:  NITROSTAT Place 1 tablet (0.4 mg total) under the tongue every 5 (five) minutes as needed.   ONE TOUCH LANCETS Misc Check once daily.   pioglitazone 30 MG tablet Commonly known as:  ACTOS Take 1  tablet (30 mg total) by mouth daily.   sitaGLIPtin 100 MG tablet Commonly known as:  JANUVIA Take 1 tablet (100 mg total) by mouth daily.          Objective:   Physical Exam BP 128/68 (BP Location: Left Arm, Patient Position: Sitting, Cuff Size: Small)   Pulse 82   Temp 98.1 F (36.7 C) (Oral)   Resp 14   Ht 6' (1.829 m)   Wt 261 lb 6 oz (118.6 kg)   SpO2 98%   BMI 35.45 kg/m  General:   Well developed, well nourished . NAD.  HEENT:  Normocephalic . Face symmetric, atraumatic Lungs:  CTA B Normal respiratory effort, no intercostal retractions, no accessory muscle use. Heart: RRR,  no murmur.  No pretibial edema bilaterally  DIABETIC FEET EXAM: No lower extremity edema Normal pedal pulses bilaterally Skin normal, nails normal, no calluses Pinprick examination of the feet normal. Skin: Not pale. Not jaundice Neurologic:  alert & oriented X3.  Speech normal, gait appropriate for age and unassisted Psych--  Cognition and judgment appear intact.  Cooperative with normal attention span and concentration.  Behavior appropriate. No anxious or depressed appearing.      Assessment & Plan:   Assessment DM w/ CAD HTN Hyperlipidemia Depression-Anxiety CAD, MI, angioplasty  PLAN: DM: Good compliance with metformin, Actos, Januvia.  Diet has improved only for the last 3 weeks.  Feet exam negative, reports a recent eye exam to be normal. Will check a A1c understanding that if he is not at goal we will simply wait a few more months because he is determined to continue with his better lifestyle High cholesterol: On Lipitor, run out for 10 days.  Had a light breakfast today.  Last FLP more than a year ago we will check a FLP. Depression, anxiety: Refill Wellbutrin RTC 4 months

## 2018-02-01 NOTE — Assessment & Plan Note (Signed)
DM: Good compliance with metformin, Actos, Januvia.  Diet has improved only for the last 3 weeks.  Feet exam negative, reports a recent eye exam to be normal. Will check a A1c understanding that if he is not at goal we will simply wait a few more months because he is determined to continue with his better lifestyle High cholesterol: On Lipitor, run out for 10 days.  Had a light breakfast today.  Last FLP more than a year ago we will check a FLP. Depression, anxiety: Refill Wellbutrin RTC 4 months

## 2018-02-01 NOTE — Patient Instructions (Signed)
GO TO THE LAB : Get the blood work     GO TO THE FRONT DESK Schedule your next appointment for a  Check up in 4 months   

## 2018-02-03 MED ORDER — CANAGLIFLOZIN 100 MG PO TABS: 100.0000 mg | ORAL_TABLET | Freq: Every day | ORAL | 4 refills | Status: DC

## 2018-02-03 NOTE — Addendum Note (Signed)
Addended byDamita Dunnings D on: 02/03/2018 09:17 AM   Modules accepted: Orders

## 2018-04-20 ENCOUNTER — Encounter: Payer: Self-pay | Admitting: Internal Medicine

## 2018-06-07 ENCOUNTER — Ambulatory Visit: Payer: 59 | Admitting: Internal Medicine

## 2018-06-12 ENCOUNTER — Ambulatory Visit (INDEPENDENT_AMBULATORY_CARE_PROVIDER_SITE_OTHER): Payer: 59 | Admitting: Internal Medicine

## 2018-06-12 ENCOUNTER — Encounter: Payer: Self-pay | Admitting: Internal Medicine

## 2018-06-12 VITALS — BP 122/80 | HR 84 | Temp 98.2°F | Resp 16 | Ht 72.0 in | Wt 264.5 lb

## 2018-06-12 DIAGNOSIS — E1159 Type 2 diabetes mellitus with other circulatory complications: Secondary | ICD-10-CM

## 2018-06-12 NOTE — Progress Notes (Signed)
Pre visit review using our clinic review tool, if applicable. No additional management support is needed unless otherwise documented below in the visit note. 

## 2018-06-12 NOTE — Patient Instructions (Signed)
GO TO THE LAB : Get the blood work     GO TO THE FRONT DESK Schedule your next appointment for a  Check up in 4 months    Check the  blood pressure weekly   Be sure your blood pressure is between 110/65 and  135/85. If it is consistently higher or lower, let me know

## 2018-06-12 NOTE — Progress Notes (Signed)
Subjective:    Patient ID: William Mckenzie, male    DOB: 09/08/1966, 52 y.o.   MRN: [id number]  DOS:  06/12/2018 Type of visit - description : f/u Interval history: Since the last office visit, he is doing very well.  Started Invokana, good compliance and tolerance. Wt Readings from Last 3 Encounters:  06/12/18 264 lb 8 oz (120 kg)  02/01/18 261 lb 6 oz (118.6 kg)  01/30/18 264 lb 6.4 oz (119.9 kg)     Review of Systems Denies any genital rash Not much change on his ambulatory CBGs, they are usually 100-1 25. Diet has not changed much, but when he eats healthy is when CBGs are the best.  Past Medical History:  Diagnosis Date  . Acute meniscal tear, medial    Left knee  . Anxiety   . CAD (coronary artery disease)    prior inferior MI in 2010 treated with stenting of the right PDA; had nonobstructive disease in the LAD and LCX with approximately 40% lesions. EF normal  . Diabetes mellitus without complication ([place])   . Hyperlipidemia   . Hypertension    white coat   . Myocardial infarction ([place])   . Obesity     Past Surgical History:  Procedure Laterality Date  . CARDIAC CATHETERIZATION    . CORONARY ANGIOPLASTY    . KNEE SURGERY Left 12/2010   LEFT, meniscus . arthroscopy  . KNEE SURGERY Left 11/2014   Meniscus, medial    Social History   Socioeconomic History  . Marital status: Married    Spouse name: Not on file  . Number of children: 1  . Years of education: Not on file  . Highest education level: Not on file  Occupational History  . Occupation: former Corporate treasurer  . Occupation: CUSTOMER SERVICE MGR    Employer: [employer]  Social Needs  . Financial resource strain: Not on file  . Food insecurity:    Worry: Not on file    Inability: Not on file  . Transportation needs:    Medical: Not on file    Non-medical: Not on file  Tobacco Use  . Smoking status: Never Smoker  . Smokeless tobacco: Former Systems developer    Types: Chew  . Tobacco comment: quit chewing  tobacco 2010   Substance and Sexual Activity  . Alcohol use: Yes    Comment: socially   . Drug use: No  . Sexual activity: Not on file  Lifestyle  . Physical activity:    Days per week: Not on file    Minutes per session: Not on file  . Stress: Not on file  Relationships  . Social connections:    Talks on phone: Not on file    Gets together: Not on file    Attends religious service: Not on file    Active member of club or organization: Not on file    Attends meetings of clubs or organizations: Not on file    Relationship status: Not on file  . Intimate partner violence:    Fear of current or ex partner: Not on file    Emotionally abused: Not on file    Physically abused: Not on file    Forced sexual activity: Not on file  Other Topics Concern  . Not on file  Social History Narrative   Lives w/ wife and daughter     Active martial arts         Allergies as of 06/12/2018   No Known  Allergies     Medication List        Accurate as of 06/12/18 11:59 PM. Always use your most recent med list.          aspirin 81 MG tablet Take 1 tablet (81 mg total) by mouth daily.   atorvastatin 40 MG tablet Commonly known as:  LIPITOR Take 1 tablet (40 mg total) by mouth daily.   buPROPion 150 MG 12 hr tablet Commonly known as:  WELLBUTRIN SR Take 1 tablet (150 mg total) by mouth 2 (two) times daily.   canagliflozin 100 MG Tabs tablet Commonly known as:  INVOKANA Take 1 tablet (100 mg total) by mouth daily before breakfast.   metFORMIN 1000 MG tablet Commonly known as:  GLUCOPHAGE Take 1 tablet (1,000 mg total) by mouth 2 (two) times daily with a meal.   metoprolol succinate 25 MG 24 hr tablet Commonly known as:  TOPROL-XL Take 1 tablet (25 mg total) by mouth daily.   nitroGLYCERIN 0.4 MG SL tablet Commonly known as:  NITROSTAT Place 1 tablet (0.4 mg total) under the tongue every 5 (five) minutes as needed.   ONE TOUCH LANCETS Misc Check once daily.   pioglitazone  30 MG tablet Commonly known as:  ACTOS Take 1 tablet (30 mg total) by mouth daily.   sitaGLIPtin 100 MG tablet Commonly known as:  JANUVIA Take 1 tablet (100 mg total) by mouth daily.          Objective:   Physical Exam BP 122/80 (BP Location: Right Arm, Patient Position: Sitting, Cuff Size: Normal)   Pulse 84   Temp 98.2 F (36.8 C) (Oral)   Resp 16   Ht 6' (1.829 m)   Wt 264 lb 8 oz (120 kg)   SpO2 98%   BMI 35.87 kg/m   General:   Well developed, NAD, see BMI.  HEENT:  Normocephalic . Face symmetric, atraumatic Lungs:  CTA B Normal respiratory effort, no intercostal retractions, no accessory muscle use. Heart: RRR,  no murmur.  No pretibial edema bilaterally  Skin: Not pale. Not jaundice Neurologic:  alert & oriented X3.  Speech normal, gait appropriate for age and unassisted Psych--  Cognition and judgment appear intact.  Cooperative with normal attention span and concentration.  Behavior appropriate. No anxious or depressed appearing.      Assessment & Plan:   Assessment DM w/ CAD HTN Hyperlipidemia Depression-Anxiety CAD, MI 2010,  Angioplasty; nuclear scan-echo ok 2016  PLAN: DM: Last A1c was 7.6, he was already on metformin, Actos, Januvia.  Invokana 100 mg was added.  He reports no side effects, CBGs do not seem to have changed much and actually what helps his CBGs the most is a healthy diet. Plan: Check a BMP, A1c.  Further advised with results, consider increase Invokana dose.  Discount coupon provided because he is very expensive. On multiple medications, offered consolidate metformin with Januvia, he is not really interested.   RTC 4 months

## 2018-06-13 LAB — BASIC METABOLIC PANEL
BUN: 21 mg/dL (ref 6–23)
CALCIUM: 9.1 mg/dL (ref 8.4–10.5)
CHLORIDE: 105 meq/L (ref 96–112)
CO2: 21 meq/L (ref 19–32)
CREATININE: 1.09 mg/dL (ref 0.40–1.50)
GFR: 75.4 mL/min (ref >60.00)
Glucose, Bld: 116 mg/dL — ABNORMAL HIGH (ref 70–99)
Potassium: 4.1 mEq/L (ref 3.5–5.1)
Sodium: 141 mEq/L (ref 135–145)

## 2018-06-13 LAB — HEMOGLOBIN A1C: Hgb A1c MFr Bld: 7.6 % — ABNORMAL HIGH (ref 4.6–6.5)

## 2018-06-13 NOTE — Assessment & Plan Note (Signed)
DM: Last A1c was 7.6, he was already on metformin, Actos, Januvia.  Invokana 100 mg was added.  He reports no side effects, CBGs do not seem to have changed much and actually what helps his CBGs the most is a healthy diet. Plan: Check a BMP, A1c.  Further advised with results, consider increase Invokana dose.  Discount coupon provided because he is very expensive. On multiple medications, offered consolidate metformin with Januvia, he is not really interested.   RTC 4 months

## 2018-06-15 ENCOUNTER — Telehealth: Payer: Self-pay

## 2018-06-15 ENCOUNTER — Other Ambulatory Visit: Payer: Self-pay

## 2018-06-15 MED ORDER — CANAGLIFLOZIN 300 MG PO TABS: 300.0000 mg | ORAL_TABLET | Freq: Every day | ORAL | 5 refills | Status: DC

## 2018-06-15 NOTE — Telephone Encounter (Signed)
PA initiated via Covermymeds; KEY: [id number]. Awaiting determination.

## 2018-06-16 MED ORDER — EMPAGLIFLOZIN 25 MG PO TABS: 25.0000 mg | ORAL_TABLET | Freq: Every day | ORAL | 5 refills | Status: DC

## 2018-06-16 NOTE — Telephone Encounter (Signed)
Rx sent. Will wait to see if Pt needs discount coupon.

## 2018-06-16 NOTE — Telephone Encounter (Signed)
Change to jardiance 25 mg 1 po qd, needs a cupon if available or can get grom the web

## 2018-06-16 NOTE — Telephone Encounter (Signed)
PA denied. Preferred alternatives: [place] or Jardiance.

## 2018-07-29 ENCOUNTER — Other Ambulatory Visit: Payer: Self-pay | Admitting: Internal Medicine

## 2018-09-12 ENCOUNTER — Ambulatory Visit (HOSPITAL_BASED_OUTPATIENT_CLINIC_OR_DEPARTMENT_OTHER): Admission: RE | Admit: 2018-09-12 | Payer: 59 | Source: Ambulatory Visit

## 2018-09-12 ENCOUNTER — Ambulatory Visit (HOSPITAL_BASED_OUTPATIENT_CLINIC_OR_DEPARTMENT_OTHER)
Admission: RE | Admit: 2018-09-12 | Discharge: 2018-09-12 | Disposition: A | Discharge status: Home / Self Care | Payer: 59 | Source: Ambulatory Visit | Attending: Internal Medicine | Admitting: Internal Medicine

## 2018-09-12 ENCOUNTER — Encounter: Payer: Self-pay | Admitting: Internal Medicine

## 2018-09-12 ENCOUNTER — Ambulatory Visit (INDEPENDENT_AMBULATORY_CARE_PROVIDER_SITE_OTHER): Payer: 59 | Admitting: Internal Medicine

## 2018-09-12 VITALS — BP 122/74 | HR 96 | Temp 98.5°F | Resp 16 | Ht 72.0 in | Wt 258.4 lb

## 2018-09-12 DIAGNOSIS — Z23 Encounter for immunization: Secondary | ICD-10-CM | POA: Diagnosis not present

## 2018-09-12 DIAGNOSIS — N452 Orchitis: Secondary | ICD-10-CM | POA: Diagnosis not present

## 2018-09-12 DIAGNOSIS — E1159 Type 2 diabetes mellitus with other circulatory complications: Secondary | ICD-10-CM | POA: Diagnosis not present

## 2018-09-12 LAB — URINALYSIS, ROUTINE W REFLEX MICROSCOPIC
BILIRUBIN URINE: NEGATIVE
Hgb urine dipstick: NEGATIVE
Nitrite: NEGATIVE
PH: 5.5 (ref 5.0–8.0)
RBC / HPF: NONE SEEN (ref 0–?)
SPECIFIC GRAVITY, URINE: 1.025 (ref 1.000–1.030)
Total Protein, Urine: NEGATIVE
Urine Glucose: NEGATIVE
Urobilinogen, UA: 0.2 (ref 0.0–1.0)

## 2018-09-12 MED ORDER — LEVOFLOXACIN 500 MG PO TABS: 500.0000 mg | ORAL_TABLET | Freq: Every day | ORAL | 0 refills | Status: DC

## 2018-09-12 NOTE — Progress Notes (Signed)
Pre visit review using our clinic review tool, if applicable. No additional management support is needed unless otherwise documented below in the visit note. 

## 2018-09-12 NOTE — Progress Notes (Signed)
Subjective:    Patient ID: William Mckenzie, male    DOB: November 26, 1965, 52 y.o.   MRN: [id number]  DOS:  09/12/2018 Type of visit - description : Acute visit  Symptoms started 2 days ago with pain and tenderness to palpation of the left testicle. The area is not swollen to him. No history of previous problems or surgeries  there . DM: good compliance with all his medications including Jardiance. Request a flu shot.  Review of Systems Denies fever chills No dysuria, gross hematuria. No inguinal pain, lump or mass. Not sexually active in 6 weeks, no history of risky sexual behaviors. Today, for the first time he had 3 episodes of loose stools.  Some nausea.  No vomiting, no abdominal pain, no blood in the stools.   Past Medical History:  Diagnosis Date  . Acute meniscal tear, medial    Left knee  . Anxiety   . CAD (coronary artery disease)    prior inferior MI in 2010 treated with stenting of the right PDA; had nonobstructive disease in the LAD and LCX with approximately 40% lesions. EF normal  . Diabetes mellitus without complication ([place])   . Hyperlipidemia   . Hypertension    white coat   . Myocardial infarction ([place])   . Obesity     Past Surgical History:  Procedure Laterality Date  . CARDIAC CATHETERIZATION    . CORONARY ANGIOPLASTY    . KNEE SURGERY Left 12/2010   LEFT, meniscus . arthroscopy  . KNEE SURGERY Left 11/2014   Meniscus, medial    Social History   Socioeconomic History  . Marital status: Married    Spouse name: Not on file  . Number of children: 1  . Years of education: Not on file  . Highest education level: Not on file  Occupational History  . Occupation: former Corporate treasurer  . Occupation: CUSTOMER SERVICE MGR    Employer: [employer]  Social Needs  . Financial resource strain: Not on file  . Food insecurity:    Worry: Not on file    Inability: Not on file  . Transportation needs:    Medical: Not on file    Non-medical: Not on file  Tobacco  Use  . Smoking status: Never Smoker  . Smokeless tobacco: Former Systems developer    Types: Chew  . Tobacco comment: quit chewing tobacco 2010   Substance and Sexual Activity  . Alcohol use: Yes    Comment: socially   . Drug use: No  . Sexual activity: Not on file  Lifestyle  . Physical activity:    Days per week: Not on file    Minutes per session: Not on file  . Stress: Not on file  Relationships  . Social connections:    Talks on phone: Not on file    Gets together: Not on file    Attends religious service: Not on file    Active member of club or organization: Not on file    Attends meetings of clubs or organizations: Not on file    Relationship status: Not on file  . Intimate partner violence:    Fear of current or ex partner: Not on file    Emotionally abused: Not on file    Physically abused: Not on file    Forced sexual activity: Not on file  Other Topics Concern  . Not on file  Social History Narrative   Lives w/ wife and daughter     Active martial arts  Allergies as of 09/12/2018   No Known Allergies     Medication List       Accurate as of September 12, 2018 11:59 PM. Always use your most recent med list.        aspirin 81 MG tablet Take 1 tablet (81 mg total) by mouth daily.   atorvastatin 40 MG tablet Commonly known as:  LIPITOR Take 1 tablet (40 mg total) by mouth daily.   buPROPion 150 MG 12 hr tablet Commonly known as:  WELLBUTRIN SR Take 1 tablet (150 mg total) by mouth 2 (two) times daily.   empagliflozin 25 MG Tabs tablet Commonly known as:  JARDIANCE Take 25 mg by mouth daily.   levofloxacin 500 MG tablet Commonly known as:  LEVAQUIN Take 1 tablet (500 mg total) by mouth daily.   metFORMIN 1000 MG tablet Commonly known as:  GLUCOPHAGE Take 1 tablet (1,000 mg total) by mouth 2 (two) times daily with a meal.   metoprolol succinate 25 MG 24 hr tablet Commonly known as:  TOPROL-XL Take 1 tablet (25 mg total) by mouth daily.     nitroGLYCERIN 0.4 MG SL tablet Commonly known as:  NITROSTAT Place 1 tablet (0.4 mg total) under the tongue every 5 (five) minutes as needed.   ONE TOUCH LANCETS Misc Check once daily.   pioglitazone 30 MG tablet Commonly known as:  ACTOS Take 1 tablet (30 mg total) by mouth daily.   sitaGLIPtin 100 MG tablet Commonly known as:  JANUVIA Take 1 tablet (100 mg total) by mouth daily.           Objective:   Physical Exam BP 122/74 (BP Location: Left Arm, Patient Position: Sitting, Cuff Size: Normal)   Pulse 96   Temp 98.5 F (36.9 C) (Oral)   Resp 16   Ht 6' (1.829 m)   Wt 258 lb 6 oz (117.2 kg)   SpO2 98%   BMI 35.04 kg/m  General:   Well developed, NAD, BMI noted.  HEENT:  Normocephalic . Face symmetric, atraumatic Abdomen:  Not distended, soft, non-tender. No rebound or rigidity.  No CVA tenderness. Groin: No mass, lymphadenopathy or hernias that I can tell. GU: Right testicle normal Left testicle: Swollen, at least 50-70 % larger than the right resticle.  + TTP.  No fluctuance.  The epididymis over the testicle seems soft and nontender. Penis: Normal Skin: Not pale. Not jaundice Neurologic:  alert & oriented X3.  Speech normal, gait appropriate for age and unassisted Psych--  Cognition and judgment appear intact.  Cooperative with normal attention span and concentration.  Behavior appropriate. No anxious or depressed appearing.     Assessment & Plan:    Assessment DM w/ CAD HTN Hyperlipidemia Depression-Anxiety CAD, MI 2010,  Angioplasty; nuclear scan-echo ok 2016  PLAN: Orchitis: Symptoms consistent with orchitis, do not suspect STDs. Doubt problem related to Jardiance which he started not long ago. Plan: UA, urine culture, G&C.  Get a stat ultrasound to rule out ischemia. Cover with Levaquin x10 days.  Patient verbalized understanding DM: Based on the last A1c, he was recommended to increase Invokana but it was not covered consequently he is  currently on Jardiance, metformin, Actos, Januvia.  Has a follow-up next month. Request a flu shot: despite the infection he is not running fevers, I think is okay to proceed.

## 2018-09-12 NOTE — Patient Instructions (Addendum)
GO TO THE LAB : Provide a urine sample    STOP BY THE FIRST FLOOR:  get the ultrasound   Start Levaquin 500 mg 1 tablet daily x10 days.  Drink plenty fluids  Put a towel under your scrotum when you are resting  Call anytime if the pain is severe, you have fever, chills, increased swelling.     Orchitis Orchitis is swelling (inflammation) of a testicle caused by infection. Testicles are the male organs that produce sperm. The testicles are held in a fleshy sac (scrotum) located behind the penis. Orchitis usually affects only one testicle, but it can occur in both. The condition can develop suddenly. Orchitis can be caused by many different kinds of bacteria and viruses. What are the causes? Orchitis can be caused by either a bacterial or viral infection. Bacterial Infections  These often occur along with an infection of the coiled tube that collects sperm and sits on top of the testicle (epididymis).  In men who are not sexually active, bacterial orchitis usually starts as a urinary tract infection and spreads to the testicle.  In sexually active men, sexually transmitted infections are the most common cause of bacterial orchitis. These can include: ? Gonorrhea. ? Chlamydia. Viral Infections  Mumps is still the most common cause of viral orchitis, though mumps is now rare in many areas because of vaccination.  Other viruses that can cause orchitis include: ? The chickenpox virus (varicella-zoster virus). ? The virus that causes mononucleosis (Epstein-Barr virus). What increases the risk? Boys and men who have not been vaccinated against mumps are at risk for mumps orchitis. Risk factors for bacterial orchitis include:  Frequent urinary tract infections.  High-risk sexual behaviors.  Having a sexual partner with a sexually transmitted infection.  Having had urinary tract surgery.  Using a tube passed through the penis to drain urine (Foley catheter).  An enlarged  prostate gland.  What are the signs or symptoms? The most common symptoms of orchitis are swelling and pain in the scrotum. Other signs and symptoms may include:  Feeling generally sick (malaise).  Fever and chills.  Painful urination.  Painful ejaculation.  Blood or discharge from the penis.  Nausea.  Headache.  Fatigue.  How is this diagnosed? Your health care provider may suspect orchitis if you have a painful, swollen testicle along with other signs and symptoms of the condition. A physical exam will be done. Tests may also be done to help your health care provider make a diagnosis. These may include:  A blood test to check for signs of infection.  A urine test to check for a urinary tract infection.  Using a swab to collect a fluid sample from the tip of the penis to test for sexually transmitted infections.  Taking an image of the testicle using sound waves and a computer (testicular ultrasound).  How is this treated? Treatment of orchitis depends on the cause. For orchitis caused by a bacterial infection, your health care provider will most likely prescribe antibiotic medicines. Bacterial infections usually clear up within a few days. Both viral infections and bacterial infections may be treated with:  Bed rest.  Anti-inflammatory medicines.  Pain medicines.  Elevating the scrotum and applying ice.  Follow these instructions at home:  Rest as directed by your health care provider.  Take medicines only as directed by your health care provider.  If you were prescribed an antibiotic medicine, finish it all even if you start to feel better.  Elevate your  scrotum and apply ice as directed: ? Put ice in a plastic bag. ? Place a small towel or pillow between your legs. ? Rest your scrotum on the pillow or towel. ? Place another towel between your skin and the plastic bag. ? Leave the ice on for 20 minutes, 2-3 times a day. Contact a health care provider  if:  You have a fever.  Pain and swelling have not gotten better after 3 days. Get help right away if:  Your pain is getting worse.  The swelling in your testicle gets worse. This information is not intended to replace advice given to you by your health care provider. Make sure you discuss any questions you have with your health care provider. Document Released: 09/10/2000 Document Revised: 02/19/2016 Document Reviewed: 01/31/2014 Elsevier Interactive Patient Education  2018 [employer].

## 2018-09-13 NOTE — Assessment & Plan Note (Signed)
Orchitis: Symptoms consistent with orchitis, do not suspect STDs. Doubt problem related to Jardiance which he started not long ago. Plan: UA, urine culture, G&C.  Get a stat ultrasound to rule out ischemia. Cover with Levaquin x10 days.  Patient verbalized understanding DM: Based on the last A1c, he was recommended to increase Invokana but it was not covered consequently he is currently on Jardiance, metformin, Actos, Januvia.  Has a follow-up next month. Request a flu shot: despite the infection he is not running fevers, I think is okay to proceed.

## 2018-09-14 LAB — URINE CULTURE
MICRO NUMBER:: 91508705
SPECIMEN QUALITY:: ADEQUATE

## 2018-10-13 ENCOUNTER — Other Ambulatory Visit: Payer: Self-pay

## 2018-10-13 ENCOUNTER — Encounter: Payer: Self-pay | Admitting: Internal Medicine

## 2018-10-13 ENCOUNTER — Ambulatory Visit (INDEPENDENT_AMBULATORY_CARE_PROVIDER_SITE_OTHER): Payer: 59 | Admitting: Internal Medicine

## 2018-10-13 VITALS — BP 121/74 | HR 81 | Temp 98.1°F | Resp 16 | Ht 72.0 in | Wt 267.4 lb

## 2018-10-13 DIAGNOSIS — E1159 Type 2 diabetes mellitus with other circulatory complications: Secondary | ICD-10-CM

## 2018-10-13 DIAGNOSIS — N390 Urinary tract infection, site not specified: Secondary | ICD-10-CM | POA: Diagnosis not present

## 2018-10-13 DIAGNOSIS — Z9861 Coronary angioplasty status: Secondary | ICD-10-CM

## 2018-10-13 DIAGNOSIS — I2510 Atherosclerotic heart disease of native coronary artery without angina pectoris: Secondary | ICD-10-CM | POA: Diagnosis not present

## 2018-10-13 DIAGNOSIS — E785 Hyperlipidemia, unspecified: Secondary | ICD-10-CM | POA: Diagnosis not present

## 2018-10-13 MED ORDER — BUPROPION HCL ER (SR) 150 MG PO TB12: 150.0000 mg | ORAL_TABLET | Freq: Two times a day (BID) | ORAL | 1 refills | Status: DC

## 2018-10-13 MED ORDER — PIOGLITAZONE HCL 30 MG PO TABS: 30.0000 mg | ORAL_TABLET | Freq: Every day | ORAL | 1 refills | Status: DC

## 2018-10-13 NOTE — Progress Notes (Signed)
Subjective:    Patient ID: William Mckenzie, male    DOB: Jun 24, 1966, 53 y.o.   MRN: [id number]  DOS:  10/13/2018 Type of visit - description: F/U  UTI, epididymitis: Symptoms quickly resolved since the last visit Depression: out of Wellbutrin x 2 days, needs a refill, feels well emotionally DM: Diet has improved, ambulatory CBGs 120, 110.  Needs a refill on Actos CAD: Remains asymptomatic   Review of Systems Denies chest pain or difficulty breathing No nausea, vomiting, diarrhea No dysuria or gross hematuria. No testicular pain  Past Medical History:  Diagnosis Date  . Acute meniscal tear, medial    Left knee  . Anxiety   . CAD (coronary artery disease)    prior inferior MI in 2010 treated with stenting of the right PDA; had nonobstructive disease in the LAD and LCX with approximately 40% lesions. EF normal  . Diabetes mellitus without complication ([place])   . Hyperlipidemia   . Hypertension    white coat   . Myocardial infarction ([place])   . Obesity     Past Surgical History:  Procedure Laterality Date  . CARDIAC CATHETERIZATION    . CORONARY ANGIOPLASTY    . KNEE SURGERY Left 12/2010   LEFT, meniscus . arthroscopy  . KNEE SURGERY Left 11/2014   Meniscus, medial    Social History   Socioeconomic History  . Marital status: Married    Spouse name: Not on file  . Number of children: 1  . Years of education: Not on file  . Highest education level: Not on file  Occupational History  . Occupation: former Corporate treasurer  . Occupation: CUSTOMER SERVICE MGR    Employer: [employer]  Social Needs  . Financial resource strain: Not on file  . Food insecurity:    Worry: Not on file    Inability: Not on file  . Transportation needs:    Medical: Not on file    Non-medical: Not on file  Tobacco Use  . Smoking status: Never Smoker  . Smokeless tobacco: Former Systems developer    Types: Chew  . Tobacco comment: quit chewing tobacco 2010   Substance and Sexual Activity  . Alcohol use: Yes      Comment: socially   . Drug use: No  . Sexual activity: Not on file  Lifestyle  . Physical activity:    Days per week: Not on file    Minutes per session: Not on file  . Stress: Not on file  Relationships  . Social connections:    Talks on phone: Not on file    Gets together: Not on file    Attends religious service: Not on file    Active member of club or organization: Not on file    Attends meetings of clubs or organizations: Not on file    Relationship status: Not on file  . Intimate partner violence:    Fear of current or ex partner: Not on file    Emotionally abused: Not on file    Physically abused: Not on file    Forced sexual activity: Not on file  Other Topics Concern  . Not on file  Social History Narrative   Lives w/ wife and daughter     Active martial arts         Allergies as of 10/13/2018   No Known Allergies     Medication List       Accurate as of October 13, 2018 11:59 PM. Always use your most  recent med list.        aspirin 81 MG tablet Take 1 tablet (81 mg total) by mouth daily.   atorvastatin 40 MG tablet Commonly known as:  LIPITOR Take 1 tablet (40 mg total) by mouth daily.   buPROPion 150 MG 12 hr tablet Commonly known as:  WELLBUTRIN SR Take 1 tablet (150 mg total) by mouth 2 (two) times daily.   empagliflozin 25 MG Tabs tablet Commonly known as:  JARDIANCE Take 25 mg by mouth daily.   metFORMIN 1000 MG tablet Commonly known as:  GLUCOPHAGE Take 1 tablet (1,000 mg total) by mouth 2 (two) times daily with a meal.   metoprolol succinate 25 MG 24 hr tablet Commonly known as:  TOPROL-XL Take 1 tablet (25 mg total) by mouth daily.   nitroGLYCERIN 0.4 MG SL tablet Commonly known as:  NITROSTAT Place 1 tablet (0.4 mg total) under the tongue every 5 (five) minutes as needed.   ONE TOUCH LANCETS Misc Check once daily.   pioglitazone 30 MG tablet Commonly known as:  ACTOS Take 1 tablet (30 mg total) by mouth daily.    sitaGLIPtin 100 MG tablet Commonly known as:  JANUVIA Take 1 tablet (100 mg total) by mouth daily.           Objective:   Physical Exam BP 121/74   Pulse 81   Temp 98.1 F (36.7 C) (Oral)   Resp 16   Ht 6' (1.829 m)   Wt 267 lb 6 oz (121.3 kg)   SpO2 98%   BMI 36.26 kg/m  General:   Well developed, NAD, BMI noted. HEENT:  Normocephalic . Face symmetric, atraumatic Lungs:  CTA B Normal respiratory effort, no intercostal retractions, no accessory muscle use. Heart: RRR,  no murmur.  No pretibial edema bilaterally  Diabetic foot exam: Feet normal to inspection, palpation.  Good pedal pulses, normal pinprick examination. Neurologic:  alert & oriented X3.  Speech normal, gait appropriate for age and unassisted Psych--  Cognition and judgment appear intact.  Cooperative with normal attention span and concentration.  Behavior appropriate. No anxious or depressed appearing.      Assessment     Assessment DM w/ CAD HTN Hyperlipidemia Depression-Anxiety CAD, MI 2010,  Angioplasty; nuclear scan-echo ok 2016 Epididymitis -UTI E Coli (first episode 08/2018)  PLAN: Epididymitis, UTI: Since the last visit, US show epididymitis, urine culture showed E. coli.  Symptoms quickly resolved.  No further evaluation needed at this point.  He is taking Jardiance, needs to be very vigilant about other UTI symptoms or GU rashes. DM: Has improved his diet to some extent, plan to increase his exercise.  On Jardiance, metformin, Actos, Januvia.  Checking a A1c, CMP. Hyperlipidemia: On Lipitor, check FLP. CAD: Asx, check a CBC Depression, anxiety: RF Wellbutrin RTC next week for labs RTC 4 months CPX

## 2018-10-13 NOTE — Patient Instructions (Signed)
GO TO THE LAB : Get the blood work    GO TO THE FRONT DESK Schedule your next appointment  1.  Labs next week 2.  Physical exam in 4 months

## 2018-10-15 NOTE — Assessment & Plan Note (Signed)
  Epididymitis, UTI: Since the last visit, US show epididymitis, urine culture showed E. coli.  Symptoms quickly resolved.  No further evaluation needed at this point.  He is taking Jardiance, needs to be very vigilant about other UTI symptoms or GU rashes. DM: Has improved his diet to some extent, plan to increase his exercise.  On Jardiance, metformin, Actos, Januvia.  Checking a A1c, CMP. Hyperlipidemia: On Lipitor, check FLP. CAD: Asx, check a CBC Depression, anxiety: RF Wellbutrin RTC next week for labs RTC 4 months CPX

## 2018-10-16 ENCOUNTER — Other Ambulatory Visit (INDEPENDENT_AMBULATORY_CARE_PROVIDER_SITE_OTHER): Payer: 59

## 2018-10-16 DIAGNOSIS — E785 Hyperlipidemia, unspecified: Secondary | ICD-10-CM

## 2018-10-16 DIAGNOSIS — E1159 Type 2 diabetes mellitus with other circulatory complications: Secondary | ICD-10-CM

## 2018-10-17 LAB — CBC WITH DIFFERENTIAL/PLATELET
Basophils Absolute: 0.1 10*3/uL (ref 0.0–0.1)
Basophils Relative: 1 % (ref 0.0–3.0)
Eosinophils Absolute: 0.1 10*3/uL (ref 0.0–0.7)
Eosinophils Relative: 1.4 % (ref 0.0–5.0)
HCT: 44.2 % (ref 39.0–52.0)
Hemoglobin: 14.7 g/dL (ref 13.0–17.0)
Lymphocytes Relative: 41.7 % (ref 12.0–46.0)
Lymphs Abs: 3 10*3/uL (ref 0.7–4.0)
MCHC: 33.2 g/dL (ref 30.0–36.0)
MCV: 91.7 fl (ref 78.0–100.0)
Monocytes Absolute: 0.5 10*3/uL (ref 0.1–1.0)
Monocytes Relative: 7.1 % (ref 3.0–12.0)
Neutro Abs: 3.5 10*3/uL (ref 1.4–7.7)
Neutrophils Relative %: 48.8 % (ref 43.0–77.0)
Platelets: 209 10*3/uL (ref 150.0–400.0)
RBC: 4.83 Mil/uL (ref 4.22–5.81)
RDW: 13.1 % (ref 11.5–15.5)
WBC: 7.2 10*3/uL (ref 4.0–10.5)

## 2018-10-17 LAB — LIPID PANEL
Cholesterol: 148 mg/dL (ref 0–200)
HDL: 47.6 mg/dL (ref >39.00)
LDL Cholesterol: 71 mg/dL (ref 0–99)
NonHDL: 100.59
Total CHOL/HDL Ratio: 3
Triglycerides: 149 mg/dL (ref 0.0–149.0)
VLDL: 29.8 mg/dL (ref 0.0–40.0)

## 2018-10-17 LAB — COMPREHENSIVE METABOLIC PANEL WITH GFR
ALT: 29 U/L (ref 0–53)
AST: 21 U/L (ref 0–37)
Albumin: 4.5 g/dL (ref 3.5–5.2)
Alkaline Phosphatase: 65 U/L (ref 39–117)
BUN: 18 mg/dL (ref 6–23)
CO2: 30 meq/L (ref 19–32)
Calcium: 9.9 mg/dL (ref 8.4–10.5)
Chloride: 102 meq/L (ref 96–112)
Creatinine, Ser: 1.23 mg/dL (ref 0.40–1.50)
GFR: 61.63 mL/min
Glucose, Bld: 118 mg/dL — ABNORMAL HIGH (ref 70–99)
Potassium: 4.4 meq/L (ref 3.5–5.1)
Sodium: 141 meq/L (ref 135–145)
Total Bilirubin: 0.5 mg/dL (ref 0.2–1.2)
Total Protein: 6.8 g/dL (ref 6.0–8.3)

## 2018-10-18 ENCOUNTER — Other Ambulatory Visit (INDEPENDENT_AMBULATORY_CARE_PROVIDER_SITE_OTHER): Payer: 59

## 2018-10-18 DIAGNOSIS — E119 Type 2 diabetes mellitus without complications: Secondary | ICD-10-CM

## 2018-10-19 ENCOUNTER — Telehealth: Payer: Self-pay | Admitting: Internal Medicine

## 2018-10-19 LAB — HEMOGLOBIN A1C: Hgb A1c MFr Bld: 7.5 % — ABNORMAL HIGH (ref 4.6–6.5)

## 2018-10-19 MED ORDER — SEMAGLUTIDE(0.25 OR 0.5MG/DOS) 2 MG/1.5ML ~~LOC~~ SOPN: 0.2500 mg | PEN_INJECTOR | SUBCUTANEOUS | 3 refills | Status: DC

## 2018-10-19 NOTE — Telephone Encounter (Signed)
Advise patient: Cholesterol is well controlled. Diabetes has not improved at all. Recommend to stop Januvia and start Ozempic, a weekly injectable.  0.25 mg weekly x4, then 0.5 mg weekly. Ozempic has the potential to help him lose weight. If the patient is willing to change, schedule nurse visit to provide a sample and training. If he is not willing to switch medications, continue working on diet exercise and follow-up as scheduled.

## 2018-10-19 NOTE — Telephone Encounter (Signed)
Spoke w/ Pt- he will come Wednesday, 10/25/2018 for Ozempic teaching. Will send to pharmacy.

## 2018-10-19 NOTE — Telephone Encounter (Signed)
Thank you, remind him to stop Januvia

## 2018-10-25 ENCOUNTER — Ambulatory Visit: Payer: 59

## 2018-10-25 LAB — HM DIABETES EYE EXAM

## 2018-11-15 ENCOUNTER — Ambulatory Visit (INDEPENDENT_AMBULATORY_CARE_PROVIDER_SITE_OTHER): Payer: 59 | Admitting: *Deleted

## 2018-11-15 DIAGNOSIS — E1159 Type 2 diabetes mellitus with other circulatory complications: Secondary | ICD-10-CM

## 2018-11-15 NOTE — Progress Notes (Addendum)
Patient in today for teaching of how to use ozempic pen.  Directions given.   Patient did first dose of 0.25mg  in abdomen subcutaneously in office today and tolerated well.  Advised patient of things to watch out for like for low blood sugars.  He stated that he new how he would feel when sugars get low.    Also advised that medication has been sent to pharmacy and he can call and check price to see how much it would cost to get.  He will let us know if he cannot afford and of any issues he has with this medication.  Advised to make sure he keep up with his follow ups  Kathlene November, MD .

## 2018-11-16 ENCOUNTER — Encounter: Payer: Self-pay | Admitting: Internal Medicine

## 2019-02-06 ENCOUNTER — Encounter: Payer: 59 | Admitting: Internal Medicine

## 2019-03-22 ENCOUNTER — Other Ambulatory Visit: Payer: Self-pay | Admitting: Physician Assistant

## 2019-03-22 DIAGNOSIS — I2510 Atherosclerotic heart disease of native coronary artery without angina pectoris: Secondary | ICD-10-CM

## 2019-03-22 DIAGNOSIS — I10 Essential (primary) hypertension: Secondary | ICD-10-CM

## 2019-03-22 DIAGNOSIS — E785 Hyperlipidemia, unspecified: Secondary | ICD-10-CM

## 2019-04-11 ENCOUNTER — Other Ambulatory Visit: Payer: Self-pay | Admitting: Internal Medicine

## 2019-04-25 ENCOUNTER — Other Ambulatory Visit: Payer: Self-pay | Admitting: Internal Medicine

## 2019-06-13 ENCOUNTER — Other Ambulatory Visit: Payer: Self-pay | Admitting: Cardiovascular Disease

## 2019-06-13 DIAGNOSIS — E785 Hyperlipidemia, unspecified: Secondary | ICD-10-CM

## 2019-06-13 DIAGNOSIS — I2510 Atherosclerotic heart disease of native coronary artery without angina pectoris: Secondary | ICD-10-CM

## 2019-06-13 DIAGNOSIS — I10 Essential (primary) hypertension: Secondary | ICD-10-CM

## 2019-06-20 ENCOUNTER — Ambulatory Visit (INDEPENDENT_AMBULATORY_CARE_PROVIDER_SITE_OTHER): Payer: 59 | Admitting: Internal Medicine

## 2019-06-20 ENCOUNTER — Other Ambulatory Visit: Payer: Self-pay

## 2019-06-20 ENCOUNTER — Encounter: Payer: Self-pay | Admitting: Internal Medicine

## 2019-06-20 VITALS — BP 130/68 | HR 85 | Temp 96.6°F | Resp 16 | Ht 72.0 in | Wt 257.4 lb

## 2019-06-20 DIAGNOSIS — I2510 Atherosclerotic heart disease of native coronary artery without angina pectoris: Secondary | ICD-10-CM | POA: Diagnosis not present

## 2019-06-20 DIAGNOSIS — Z9861 Coronary angioplasty status: Secondary | ICD-10-CM | POA: Diagnosis not present

## 2019-06-20 DIAGNOSIS — M25562 Pain in left knee: Secondary | ICD-10-CM

## 2019-06-20 DIAGNOSIS — Z23 Encounter for immunization: Secondary | ICD-10-CM

## 2019-06-20 DIAGNOSIS — E1159 Type 2 diabetes mellitus with other circulatory complications: Secondary | ICD-10-CM | POA: Diagnosis not present

## 2019-06-20 NOTE — Patient Instructions (Addendum)
GO TO THE LAB : Get the blood work     GO TO THE FRONT DESK Schedule your next appointment for a physical exam by January 2021    Check the  blood pressure 2 times a month  BP GOAL is between 110/65 and  135/85. If it is consistently higher or lower, let me know

## 2019-06-20 NOTE — Progress Notes (Signed)
Subjective:    Patient ID: William Mckenzie, male    DOB: Jul 12, 1966, 53 y.o.   MRN: [id number]  DOS:  06/20/2019 Type of visit - description: Routine office visit Has chronic problems with the left knee, reports pain gradually getting worse for the last year, having pain most days of the week. Worse when he goes up stairs or if he carries any sort of weight. Sometimes the knee gives out and he falls. Has decided not to take any pain medications.  Denies swelling.  DM: Ran out of Ozempic a while back.  Denies any side effects.  Ambulatory CBGs reviewed  CAD: Good compliance with meds, no recent cardiology eval.   Wt Readings from Last 3 Encounters:  06/20/19 257 lb 6 oz (116.7 kg)  10/13/18 267 lb 6 oz (121.3 kg)  09/12/18 258 lb 6 oz (117.2 kg)     Review of Systems Denies fever chills No chest pain no difficulty breathing No nausea, vomiting, diarrhea  Past Medical History:  Diagnosis Date  . Acute meniscal tear, medial    Left knee  . Anxiety   . CAD (coronary artery disease)    prior inferior MI in 2010 treated with stenting of the right PDA; had nonobstructive disease in the LAD and LCX with approximately 40% lesions. EF normal  . Diabetes mellitus without complication ([place])   . Hyperlipidemia   . Hypertension    white coat   . Myocardial infarction ([place])   . Obesity     Past Surgical History:  Procedure Laterality Date  . CARDIAC CATHETERIZATION    . CORONARY ANGIOPLASTY    . KNEE SURGERY Left 12/2010   LEFT, meniscus . arthroscopy  . KNEE SURGERY Left 11/2014   Meniscus, medial    Social History   Socioeconomic History  . Marital status: Married    Spouse name: Not on file  . Number of children: 1  . Years of education: Not on file  . Highest education level: Not on file  Occupational History  . Occupation: former Corporate treasurer  . Occupation: CUSTOMER SERVICE MGR    Employer: [employer]  Social Needs  . Financial resource strain: Not on file  . Food  insecurity    Worry: Not on file    Inability: Not on file  . Transportation needs    Medical: Not on file    Non-medical: Not on file  Tobacco Use  . Smoking status: Never Smoker  . Smokeless tobacco: Former Systems developer    Types: Chew  . Tobacco comment: quit chewing tobacco 2010   Substance and Sexual Activity  . Alcohol use: Yes    Comment: socially   . Drug use: No  . Sexual activity: Not on file  Lifestyle  . Physical activity    Days per week: Not on file    Minutes per session: Not on file  . Stress: Not on file  Relationships  . Social Herbalist on phone: Not on file    Gets together: Not on file    Attends religious service: Not on file    Active member of club or organization: Not on file    Attends meetings of clubs or organizations: Not on file    Relationship status: Not on file  . Intimate partner violence    Fear of current or ex partner: Not on file    Emotionally abused: Not on file    Physically abused: Not on file  Forced sexual activity: Not on file  Other Topics Concern  . Not on file  Social History Narrative   Lives w/ wife and daughter     Active martial arts         Allergies as of 06/20/2019   No Known Allergies     Medication List       Accurate as of June 20, 2019 11:59 PM. If you have any questions, ask your nurse or doctor.        STOP taking these medications   Semaglutide(0.25 or 0.5MG /DOS) 2 MG/1.5ML Sopn Commonly known as: Ozempic (0.25 or 0.5 MG/DOSE) Stopped by: Kathlene November, MD     TAKE these medications   aspirin 81 MG tablet Take 1 tablet (81 mg total) by mouth daily.   atorvastatin 40 MG tablet Commonly known as: LIPITOR Take 1 tablet (40 mg total) by mouth daily. Please make overdue appt with Dr. Burt Knack before anymore refills. 2nd attempt   buPROPion 150 MG 12 hr tablet Commonly known as: WELLBUTRIN SR Take 1 tablet (150 mg total) by mouth 2 (two) times daily.   empagliflozin 25 MG Tabs tablet  Commonly known as: Jardiance Take 25 mg by mouth daily.   metFORMIN 1000 MG tablet Commonly known as: GLUCOPHAGE Take 1 tablet (1,000 mg total) by mouth 2 (two) times daily with a meal.   metoprolol succinate 25 MG 24 hr tablet Commonly known as: TOPROL-XL Take 1 tablet (25 mg total) by mouth daily. Please make overdue appt with Dr. Burt Knack before anymore refills. 2nd attempt   nitroGLYCERIN 0.4 MG SL tablet Commonly known as: NITROSTAT Place 1 tablet (0.4 mg total) under the tongue every 5 (five) minutes as needed.   ONE TOUCH LANCETS Misc Check once daily.   pioglitazone 30 MG tablet Commonly known as: ACTOS Take 1 tablet (30 mg total) by mouth daily.           Objective:   Physical Exam BP 130/68 (BP Location: Left Arm, Patient Position: Sitting, Cuff Size: Normal)   Pulse 85   Temp (!) 96.6 F (35.9 C) (Temporal)   Resp 16   Ht 6' (1.829 m)   Wt 257 lb 6 oz (116.7 kg)   SpO2 98%   BMI 34.91 kg/m  General:   Well developed, NAD, BMI noted.  HEENT:  Normocephalic . Face symmetric, atraumatic Lungs:  CTA B Normal respiratory effort, no intercostal retractions, no accessory muscle use. Heart: RRR,  no murmur.  no pretibial edema bilaterally  Abdomen:  Not distended, soft, non-tender. No rebound or rigidity. MSK:  Right knee: Very mild bony changes consistent with DJD.  No effusion Left knee: + Bony changes consistent with DJD, no effusion, no warmness, range of motion normal.  Some pain elicited by pivoting the knee Skin: Not pale. Not jaundice Neurologic:  alert & oriented X3.  Speech normal, gait appropriate for age and unassisted Psych--  Cognition and judgment appear intact.  Cooperative with normal attention span and concentration.  Behavior appropriate. No anxious or depressed appearing.     Assessment       Assessment DM w/ CAD HTN Hyperlipidemia Depression-Anxiety CAD, MI 2010,  Angioplasty; nuclear scan-echo ok 2016 Epididymitis -UTI E  Coli (first episode 08/2018)  PLAN: Left knee pain: s/p  2 arthroscopies, last 09/2014, gradual return of pain for the last year.  Request Ortho referral, will send him back to Dr. Berenice Primas. Also recommend ice, judicious use of Tylenol/ibuprofen with GI precautions and a knee  sleeve. DM: On Jardiance, metformin, Actos.  Run out of Ozempic a while back (no  S/E)  .CBGs 95-110.  Has lost weight, improve diet. We will check BMP, A1c, micro, TSH. Based on results consider restart Ozempic. CAD: Asymptomatic, last visit with cardiology in 01/2018. Preventive care flu shot today RTC CPX 09-2019

## 2019-06-20 NOTE — Progress Notes (Signed)
Pre visit review using our clinic review tool, if applicable. No additional management support is needed unless otherwise documented below in the visit note. 

## 2019-06-21 ENCOUNTER — Other Ambulatory Visit: Payer: Self-pay | Admitting: Cardiovascular Disease

## 2019-06-21 DIAGNOSIS — I10 Essential (primary) hypertension: Secondary | ICD-10-CM

## 2019-06-21 DIAGNOSIS — E785 Hyperlipidemia, unspecified: Secondary | ICD-10-CM

## 2019-06-21 DIAGNOSIS — I2510 Atherosclerotic heart disease of native coronary artery without angina pectoris: Secondary | ICD-10-CM

## 2019-06-21 LAB — BASIC METABOLIC PANEL
BUN: 14 mg/dL (ref 6–23)
CO2: 30 mEq/L (ref 19–32)
Calcium: 10 mg/dL (ref 8.4–10.5)
Chloride: 103 mEq/L (ref 96–112)
Creatinine, Ser: 1.15 mg/dL (ref 0.40–1.50)
GFR: 66.43 mL/min (ref >60.00)
Glucose, Bld: 149 mg/dL — ABNORMAL HIGH (ref 70–99)
Potassium: 4.2 mEq/L (ref 3.5–5.1)
Sodium: 142 mEq/L (ref 135–145)

## 2019-06-21 LAB — MICROALBUMIN / CREATININE URINE RATIO
Creatinine,U: 231.3 mg/dL
Microalb Creat Ratio: 0.5 mg/g (ref 0.0–30.0)
Microalb, Ur: 1.1 mg/dL (ref 0.0–1.9)

## 2019-06-21 LAB — HEMOGLOBIN A1C: Hgb A1c MFr Bld: 8.8 % — ABNORMAL HIGH (ref 4.6–6.5)

## 2019-06-21 LAB — TSH: TSH: 1.48 u[IU]/mL (ref 0.35–4.50)

## 2019-06-21 NOTE — Assessment & Plan Note (Signed)
Left knee pain: s/p  2 arthroscopies, last 09/2014, gradual return of pain for the last year.  Request Ortho referral, will send him back to Dr. Berenice Primas. Also recommend ice, judicious use of Tylenol/ibuprofen with GI precautions and a knee sleeve. DM: On Jardiance, metformin, Actos.  Run out of Ozempic a while back (no  S/E)  .CBGs 95-110.  Has lost weight, improve diet. We will check BMP, A1c, micro, TSH. Based on results consider restart Ozempic. CAD: Asymptomatic, last visit with cardiology in 01/2018. Preventive care flu shot today RTC CPX 09-2019

## 2019-06-25 MED ORDER — RYBELSUS 7 MG PO TABS: 7.0000 mg | ORAL_TABLET | Freq: Every day | ORAL | 3 refills | Status: DC

## 2019-06-25 MED ORDER — RYBELSUS 3 MG PO TABS: 3.0000 mg | ORAL_TABLET | Freq: Every day | ORAL | 0 refills | Status: AC

## 2019-06-25 NOTE — Addendum Note (Signed)
Addended byDamita Dunnings D on: 06/25/2019 08:27 AM   Modules accepted: Orders

## 2019-07-25 ENCOUNTER — Other Ambulatory Visit: Payer: Self-pay | Admitting: Internal Medicine

## 2019-08-01 ENCOUNTER — Telehealth: Payer: Self-pay | Admitting: Internal Medicine

## 2019-08-01 ENCOUNTER — Telehealth: Payer: Self-pay

## 2019-08-01 NOTE — Telephone Encounter (Addendum)
Form faxed to [place] at [phone]. Form sent for scanning. Received fax confirmation.

## 2019-08-01 NOTE — Telephone Encounter (Signed)
Error - duplicate note.

## 2019-08-01 NOTE — Telephone Encounter (Signed)
Patient returned call to office. Per note of Dr Larose Kells. Patient is taking all medications on his med list as prescribed. He is taking his Rybelsus.  His CBG reading have been between 95-105. He checks his BS 2-3 times a week. He states that he understands he will need Cardiology clearance. He states Dr Burt Knack has all the papers.

## 2019-08-01 NOTE — Telephone Encounter (Signed)
CBGs very good. Cleared from my side JP

## 2019-08-01 NOTE — Telephone Encounter (Signed)
LMOM informing Pt to return call. Okay for PEC to discuss.  

## 2019-08-01 NOTE — Telephone Encounter (Addendum)
    Anselm Lis, RN 11 minutes ago (3:17 PM)     Patient returned call to office. Per note of Dr Larose Kells. Patient is taking all medications on his med list as prescribed. He is taking his Rybelsus.  His CBG reading have been between 95-105. He checks his BS 2-3 times a week. He states that he understands he will need Cardiology clearance. He states Dr Burt Knack has all the papers.

## 2019-08-01 NOTE — Telephone Encounter (Signed)
Received surgical clearance form from [facility]- Pt needing L knee arthroscopy, MAC anesthesia w/ Dr. Berenice Primas. Form placed in PCP red folder for completion. Please advise if Pt needs appt.

## 2019-08-01 NOTE — Telephone Encounter (Signed)
Please call patient. Question for the patient: Is he taking Rybelsus and the other medications? CBGs readings? Also, let him know he will need cardiology clearance as well.

## 2019-08-06 ENCOUNTER — Other Ambulatory Visit: Payer: Self-pay | Admitting: Orthopedic Surgery

## 2019-08-07 ENCOUNTER — Telehealth: Payer: Self-pay | Admitting: Cardiovascular Disease

## 2019-08-07 NOTE — Telephone Encounter (Signed)
° °  [facility] Pre-operative Risk Assessment    Request for surgical clearance:  1. What type of surgery is being performed? Scope of knee  2. When is this surgery scheduled? 08/20/19  3. What type of clearance is required (medical clearance vs. Pharmacy clearance to hold med vs. Both)? Patient is not sure  4. Are there any medications that need to be held prior to surgery and how long? n/a  5. Practice name and name of physician performing surgery? Dr. Berenice Primas, [facility]  6. What is your office phone number: [phone]   7.   What is your office fax number  8.   Anesthesia type (None, local, MAC, general) ? general   Patient had a request for surgical clearance sent to our office and Dr. Ethel Rana office. The orthopaedic already cleared the patient for surgery. The patient needs to know if he needs to be cleared by Cardiology prior to surgery. He was  Scheduled to see Richardson Dopp Friday 08/10/19 but is unable to make that appointment. He needs to self quarantine for 5 days prior to the surgery/   Johnna Acosta 08/07/2019, 3:32 PM  _________________________________________________________________   (provider comments below)

## 2019-08-07 NOTE — Telephone Encounter (Signed)
   Left a voicemail for patient to call back. Patient has not been seen since 01/2018 and is scheduled to see Richardson Dopp 08/10/2019, though there is concern he may not be able to attend this visit. Will await call back to clarify timing issue.   Abigail Butts, PA-C 08/07/19; 3:51 PM

## 2019-08-07 NOTE — Telephone Encounter (Signed)
Spoke with pt who states that he has a work conflict and needs to reschedule pre-op clearance appointment with Richardson Dopp, PA-C on 11/13. Pt is now rescheduled for 11/12 at 11:00 with Kathyrn Drown, NP. Pt verbalized understanding and thanked me for the call.

## 2019-08-07 NOTE — Telephone Encounter (Signed)
   Primary Coldiron, MD  Chart reviewed as part of pre-operative protocol coverage. Because of William Mckenzie's past medical history and time since last visit, he/she will require a follow-up visit in order to better assess preoperative cardiovascular risk.    Pre-op covering staff: - Please schedule appointment and call patient to inform them. He has an appointment 08/10/2019 with Richardson Dopp, however has a work engagement that he cannot miss that morning and needs to reschedule this appointment. He is available 11/11-11/12 and after 1:30pm 11/13. He anticipates needing to quarantine starting 08/13/2019. - Please contact requesting surgeon's office via preferred method (i.e, phone, fax) to inform them of need for appointment prior to surgery.   Abigail Butts, PA-C  08/07/2019, 4:19 PM

## 2019-08-07 NOTE — Progress Notes (Signed)
Cardiology Office Note   Date:  08/09/2019   ID:  [name], DOB [birth year], MRN [id number]  PCP:  Colon Branch, MD  Cardiologist:  Dr. Burt Knack, MD   Chief Complaint  Patient presents with  . Pre-op Exam    History of Present Illness: William Mckenzie is a 53 y.o. male who presents for pre-op clearance for knee scope to be performed 08/20/2019, seen for Dr. Burt Knack.   He has a hx of hx of CAD, HTN, HLD and DM2.   The patient initially presented with an inferior wall MI in 2010. He was treated with primary PCI of the PDA branch with a drug-eluting stent.  The patient was hospitalized in 10/2014 with back pain and nausea. Symptoms were similar to those of his MI. He ruled out by serial enzymes. A nuclear scan demonstrated scar without ischemia. An echocardiogram showed normal LV systolic function with an ejection fraction of 55-60%. No further cardiac evaluation was recommended at that time.  He was last seen by Robbie Lis, PA-C on 01/30/2018 for f/u. At that time, he was exercising 3-4 times per week without any angina or dyspnea.  No orthopnea, PND, syncope, lower extremity edema or melena. He was compliant with medications.  His Lipitor managed by PCP.  Today, he is doing well from a cardiac perspective.  Has had no anginal symptoms.  Reports changing his diet approximately 2 months ago with increased fruits and vegetables and low carbohydrates.  He has lost 20 pounds since that time.  Reports his exercise is now limited by his knee pain however was previously exercising 3-5 times per week without chest pain.  He is able to perform greater than 4 METS at baseline without anginal or SOB symptoms.  Denies shortness of breath, diaphoresis, LE swelling, orthopnea, dizziness or syncope.  EKG performed today with more pronounced TWI in lead III when compared to previous EKG from 01/30/2018.  Discussed this with patient however in the absence of anginal symptoms and  ability to perform baseline tasks at greater than 4 METS as above, he will be at acceptable risk to proceed with surgery.  Discussed case with Dr. Burt Knack, primary cardiologist as well.  We will plan for serial repeat stress testing given the duration of time since last study in 2016.  Past Medical History:  Diagnosis Date  . Acute meniscal tear, medial    Left knee  . Anxiety   . CAD (coronary artery disease)    prior inferior MI in 2010 treated with stenting of the right PDA; had nonobstructive disease in the LAD and LCX with approximately 40% lesions. EF normal  . Diabetes mellitus without complication ([place])   . Hyperlipidemia   . Hypertension    white coat   . Myocardial infarction ([place])   . Obesity     Past Surgical History:  Procedure Laterality Date  . CARDIAC CATHETERIZATION    . CORONARY ANGIOPLASTY    . KNEE SURGERY Left 12/2010   LEFT, meniscus . arthroscopy  . KNEE SURGERY Left 11/2014   Meniscus, medial     Current Outpatient Medications  Medication Sig Dispense Refill  . aspirin 81 MG tablet Take 1 tablet (81 mg total) by mouth daily.    Marland Kitchen atorvastatin (LIPITOR) 40 MG tablet Take 1 tablet (40 mg total) by mouth daily. Please make overdue appt with Dr. Burt Knack before anymore refills. 2nd attempt 15 tablet 0  . buPROPion (WELLBUTRIN SR) 150 MG 12 hr  tablet Take 1 tablet (150 mg total) by mouth 2 (two) times daily. 180 tablet 1  . empagliflozin (JARDIANCE) 25 MG TABS tablet Take 25 mg by mouth daily. 30 tablet 5  . metFORMIN (GLUCOPHAGE) 1000 MG tablet Take 1 tablet (1,000 mg total) by mouth 2 (two) times daily with a meal. 180 tablet 1  . metoprolol succinate (TOPROL-XL) 25 MG 24 hr tablet Take 1 tablet (25 mg total) by mouth daily. Please make overdue appt with Dr. Burt Knack before anymore refills. 2nd attempt 15 tablet 0  . nitroGLYCERIN (NITROSTAT) 0.4 MG SL tablet Place 1 tablet (0.4 mg total) under the tongue every 5 (five) minutes as needed. 25 tablet 2  . ONE TOUCH  LANCETS MISC Check once daily. 200 each 3  . pioglitazone (ACTOS) 30 MG tablet Take 1 tablet (30 mg total) by mouth daily. 90 tablet 1  . Semaglutide (RYBELSUS) 7 MG TABS Take 7 mg by mouth daily. Thereafter 30 tablet 3   No current facility-administered medications for this visit.     Allergies:   Patient has no known allergies.    Social History:  The patient  reports that he has never smoked. He quit smokeless tobacco use about 6 years ago.  His smokeless tobacco use included chew. He reports current alcohol use. He reports that he does not use drugs.   Family History:  The patient's family history includes CAD in an other family member; Heart disease in his father and paternal grandfather; Hypertension in his mother.    ROS:  Please see the history of present illness.   Otherwise, review of systems are positive for none.   All other systems are reviewed and negative.    PHYSICAL EXAM: VS:  BP 132/88   Pulse 86   Ht 6' (1.829 m)   Wt 253 lb 12.8 oz (115.1 kg)   SpO2 96%   BMI 34.42 kg/m  , BMI Body mass index is 34.42 kg/m.   General: Well developed, well nourished, NAD Neck: Negative for carotid bruits. No JVD Lungs:Clear to ausculation bilaterally. No wheezes, rales, or rhonchi. Breathing is unlabored. Cardiovascular: RRR with S1 S2. No murmurs, rubs, gallops, or LV heave appreciated. Extremities: No edema. No clubbing or cyanosis. DP/PT pulses 2+ bilaterally Neuro: Alert and oriented. No focal deficits. No facial asymmetry. MAE spontaneously. Psych: Responds to questions appropriately with normal affect.     EKG:  EKG is ordered today. The ekg ordered today demonstrates NSR, HR 68 bpm.  More pronounced T WI in lead III when compared to prior EKGs.  No anginal symptoms.  Discussed with Dr. Burt Knack.   Recent Labs: 10/16/2018: ALT 29; Hemoglobin 14.7; Platelets 209.0 06/20/2019: BUN 14; Creatinine, Ser 1.15; Potassium 4.2; Sodium 142; TSH 1.48    Lipid Panel     Component Value Date/Time   CHOL 148 10/16/2018 1544   TRIG 149.0 10/16/2018 1544   HDL 47.60 10/16/2018 1544   CHOLHDL 3 10/16/2018 1544   VLDL 29.8 10/16/2018 1544   LDLCALC 71 10/16/2018 1544   LDLDIRECT 179.4 11/21/2007 1156     Wt Readings from Last 3 Encounters:  08/09/19 253 lb 12.8 oz (115.1 kg)  06/20/19 257 lb 6 oz (116.7 kg)  10/13/18 267 lb 6 oz (121.3 kg)    Other studies Reviewed: Additional studies/ records that were reviewed today include:   Myoview 11/05/14 IMPRESSION: 1. Moderate sized, medium intensity fixed defect in the inferior wall consistent with diaphragmatic attenuation. No reversible ischemia or infarction. 2.  Mildly reduced left ventricular wall motion. 3. Left ventricular ejection fraction 44% 4. Intermediate-risk stress test findings*.  Echo 11/05/14 - EF 55-60%. Wall motion was normal - Right ventricle: The cavity size was normal. Systolic function was normal.  Cardiac Cath/PCI 02/2009 LAD: prox 40%, mid 40% LCx: Mid 40% RCA: PCA 100% >>PCI: 2.5- x 22mm Xience DES EF: 55% with inf-apical AK  ASSESSMENT AND PLAN:  1. CAD: -No anginal symptoms -EKG with new, more pronounced TW IN lead III however in the absence of symptoms and ability to perform greater than 4 METS without symptoms he will be at acceptable risk to proceed with surgery.  See below. -Continue ASA, statin and BB  2. HTN: -Well controlled, 132/88 -Continue current regimen   3. HLD: -Managed per PCP  -Last LDL, 05/2019 at 71  4. DM2: -Followed by PCP  5. Pre-operative assessment: -The patient does not have any unstable cardiac conditions.  Upon evaluation today, he can achieve greater then 4 METs without anginal symptoms.  According to [facility] and AHA guidelines, he requires no further cardiac workup prior to surgery and should be at acceptable risk. His Revised Cardiac Risk Index is 1, indicating a low risk of peri-procedural MI or cardiac arrest following knee surgery  at 0.9%.  -We will schedule for serial stress testing given duration from last coronary assessment   Current medicines are reviewed at length with the patient today.  The patient does not have concerns regarding medicines.  The following changes have been made:  no change  Labs/ tests ordered today include: None   Orders Placed This Encounter  Procedures  . EKG 12-Lead    Disposition:   FU with Dr. Burt Knack in 1 year  Signed, Kathyrn Drown, NP  08/09/2019 11:38 AM    [address], [place], [place]  [address] Phone: [phone]; Fax: [phone]

## 2019-08-08 ENCOUNTER — Other Ambulatory Visit: Payer: Self-pay | Admitting: Orthopedic Surgery

## 2019-08-09 ENCOUNTER — Ambulatory Visit (INDEPENDENT_AMBULATORY_CARE_PROVIDER_SITE_OTHER): Payer: 59 | Admitting: Cardiology

## 2019-08-09 ENCOUNTER — Encounter: Payer: Self-pay | Admitting: Cardiology

## 2019-08-09 ENCOUNTER — Other Ambulatory Visit: Payer: Self-pay

## 2019-08-09 VITALS — BP 132/88 | HR 86 | Ht 72.0 in | Wt 253.8 lb

## 2019-08-09 DIAGNOSIS — I2510 Atherosclerotic heart disease of native coronary artery without angina pectoris: Secondary | ICD-10-CM | POA: Diagnosis not present

## 2019-08-09 DIAGNOSIS — E785 Hyperlipidemia, unspecified: Secondary | ICD-10-CM | POA: Diagnosis not present

## 2019-08-09 DIAGNOSIS — I10 Essential (primary) hypertension: Secondary | ICD-10-CM

## 2019-08-09 DIAGNOSIS — Z9861 Coronary angioplasty status: Secondary | ICD-10-CM

## 2019-08-09 DIAGNOSIS — E1159 Type 2 diabetes mellitus with other circulatory complications: Secondary | ICD-10-CM

## 2019-08-09 NOTE — Addendum Note (Signed)
Addended by: Mady Haagensen on: 08/09/2019 02:03 PM   Modules accepted: Orders

## 2019-08-09 NOTE — Patient Instructions (Signed)
Medication Instructions:   Your physician recommends that you continue on your current medications as directed. Please refer to the Current Medication list given to you today.  *If you need a refill on your cardiac medications before your next appointment, please call your pharmacy*  Lab Work:  None ordered today  Testing/Procedures:  None ordered today  Follow-Up: At [facility], you and your health needs are our priority.  As part of our continuing mission to provide you with exceptional heart care, we have created designated Provider Care Teams.  These Care Teams include your primary Cardiologist (physician) and Advanced Practice Providers (APPs -  Physician Assistants and Nurse Practitioners) who all work together to provide you with the care you need, when you need it.  Your next appointment:   12 months  The format for your next appointment:   In Person  Provider:   You may see Sherren Mocha, MD or one of the following Advanced Practice Providers on your designated Care Team:    Richardson Dopp, PA-C  Vin Sutton, [place]  Daune Perch, [place]

## 2019-08-10 ENCOUNTER — Encounter (HOSPITAL_COMMUNITY): Payer: Self-pay | Admitting: Cardiology

## 2019-08-10 ENCOUNTER — Ambulatory Visit: Payer: 59 | Admitting: Physician Assistant

## 2019-08-13 ENCOUNTER — Encounter (HOSPITAL_COMMUNITY): Payer: Self-pay | Admitting: *Deleted

## 2019-08-13 ENCOUNTER — Encounter (HOSPITAL_BASED_OUTPATIENT_CLINIC_OR_DEPARTMENT_OTHER): Payer: Self-pay | Admitting: *Deleted

## 2019-08-13 ENCOUNTER — Other Ambulatory Visit: Payer: Self-pay

## 2019-08-13 ENCOUNTER — Telehealth (HOSPITAL_COMMUNITY): Payer: Self-pay | Admitting: *Deleted

## 2019-08-13 NOTE — Telephone Encounter (Signed)
Patient given detailed instructions per Myocardial Perfusion Study Information Sheet for the test on 08/15/2019 at 1045. Patient notified to arrive 15 minutes early and that it is imperative to arrive on time for appointment to keep from having the test rescheduled.  If you need to cancel or reschedule your appointment, please call the office within 24 hours of your appointment. . Patient verbalized understanding.Sima Lindenberger, Ranae Palms My chart letter sent

## 2019-08-14 ENCOUNTER — Other Ambulatory Visit: Payer: Self-pay | Admitting: Cardiovascular Disease

## 2019-08-14 DIAGNOSIS — I2510 Atherosclerotic heart disease of native coronary artery without angina pectoris: Secondary | ICD-10-CM

## 2019-08-14 DIAGNOSIS — I10 Essential (primary) hypertension: Secondary | ICD-10-CM

## 2019-08-14 DIAGNOSIS — E785 Hyperlipidemia, unspecified: Secondary | ICD-10-CM

## 2019-08-15 ENCOUNTER — Encounter (HOSPITAL_COMMUNITY): Payer: Self-pay

## 2019-08-15 ENCOUNTER — Ambulatory Visit (HOSPITAL_COMMUNITY): Payer: 59 | Attending: Cardiovascular Disease

## 2019-08-15 ENCOUNTER — Other Ambulatory Visit: Payer: Self-pay | Admitting: Orthopedic Surgery

## 2019-08-15 ENCOUNTER — Other Ambulatory Visit: Payer: Self-pay

## 2019-08-15 VITALS — Wt 253.0 lb

## 2019-08-15 DIAGNOSIS — I201 Angina pectoris with documented spasm: Secondary | ICD-10-CM | POA: Diagnosis present

## 2019-08-15 DIAGNOSIS — I2510 Atherosclerotic heart disease of native coronary artery without angina pectoris: Secondary | ICD-10-CM | POA: Diagnosis present

## 2019-08-15 DIAGNOSIS — E1159 Type 2 diabetes mellitus with other circulatory complications: Secondary | ICD-10-CM | POA: Diagnosis present

## 2019-08-15 DIAGNOSIS — Z9861 Coronary angioplasty status: Secondary | ICD-10-CM

## 2019-08-15 LAB — MYOCARDIAL PERFUSION IMAGING
LV dias vol: 99 mL (ref 62–150)
LV sys vol: 40 mL
Peak HR: 93 {beats}/min
Rest HR: 70 {beats}/min
SDS: 5
SRS: 0
SSS: 5
TID: 0.98

## 2019-08-15 MED ORDER — TECHNETIUM TC 99M TETROFOSMIN IV KIT
32.1000 | PACK | Freq: Once | INTRAVENOUS | Status: AC | PRN
  Administered 2019-08-15: 32.1 via INTRAVENOUS
  Filled 2019-08-15: qty 33

## 2019-08-15 MED ORDER — REGADENOSON 0.4 MG/5ML IV SOLN
0.4000 mg | Freq: Once | INTRAVENOUS | Status: AC
  Administered 2019-08-15: 0.4 mg via INTRAVENOUS

## 2019-08-15 MED ORDER — TECHNETIUM TC 99M TETROFOSMIN IV KIT
10.1000 | PACK | Freq: Once | INTRAVENOUS | Status: AC | PRN
  Administered 2019-08-15: 10.1 via INTRAVENOUS
  Filled 2019-08-15: qty 11

## 2019-08-16 ENCOUNTER — Other Ambulatory Visit: Payer: Self-pay | Admitting: Cardiovascular Disease

## 2019-08-16 ENCOUNTER — Other Ambulatory Visit (HOSPITAL_COMMUNITY)
Admission: RE | Admit: 2019-08-16 | Discharge: 2019-08-16 | Disposition: A | Discharge status: Home / Self Care | Payer: 59 | Source: Ambulatory Visit | Attending: Orthopedic Surgery | Admitting: Orthopedic Surgery

## 2019-08-16 ENCOUNTER — Encounter (HOSPITAL_BASED_OUTPATIENT_CLINIC_OR_DEPARTMENT_OTHER)
Admission: RE | Admit: 2019-08-16 | Discharge: 2019-08-16 | Disposition: A | Discharge status: Home / Self Care | Payer: 59 | Source: Ambulatory Visit | Attending: Orthopedic Surgery | Admitting: Orthopedic Surgery

## 2019-08-16 DIAGNOSIS — Z01812 Encounter for preprocedural laboratory examination: Secondary | ICD-10-CM | POA: Insufficient documentation

## 2019-08-16 DIAGNOSIS — I252 Old myocardial infarction: Secondary | ICD-10-CM | POA: Diagnosis not present

## 2019-08-16 DIAGNOSIS — E669 Obesity, unspecified: Secondary | ICD-10-CM | POA: Diagnosis not present

## 2019-08-16 DIAGNOSIS — F418 Other specified anxiety disorders: Secondary | ICD-10-CM | POA: Diagnosis not present

## 2019-08-16 DIAGNOSIS — Z6834 Body mass index (BMI) 34.0-34.9, adult: Secondary | ICD-10-CM | POA: Diagnosis not present

## 2019-08-16 DIAGNOSIS — Z20828 Contact with and (suspected) exposure to other viral communicable diseases: Secondary | ICD-10-CM | POA: Insufficient documentation

## 2019-08-16 DIAGNOSIS — I10 Essential (primary) hypertension: Secondary | ICD-10-CM | POA: Diagnosis not present

## 2019-08-16 DIAGNOSIS — E119 Type 2 diabetes mellitus without complications: Secondary | ICD-10-CM | POA: Diagnosis not present

## 2019-08-16 DIAGNOSIS — G4730 Sleep apnea, unspecified: Secondary | ICD-10-CM | POA: Diagnosis not present

## 2019-08-16 DIAGNOSIS — E785 Hyperlipidemia, unspecified: Secondary | ICD-10-CM | POA: Diagnosis not present

## 2019-08-16 DIAGNOSIS — Z955 Presence of coronary angioplasty implant and graft: Secondary | ICD-10-CM | POA: Diagnosis not present

## 2019-08-16 DIAGNOSIS — Z7982 Long term (current) use of aspirin: Secondary | ICD-10-CM | POA: Diagnosis not present

## 2019-08-16 DIAGNOSIS — S83242A Other tear of medial meniscus, current injury, left knee, initial encounter: Secondary | ICD-10-CM | POA: Insufficient documentation

## 2019-08-16 DIAGNOSIS — M1712 Unilateral primary osteoarthritis, left knee: Secondary | ICD-10-CM | POA: Diagnosis not present

## 2019-08-16 DIAGNOSIS — M94262 Chondromalacia, left knee: Secondary | ICD-10-CM | POA: Diagnosis not present

## 2019-08-16 DIAGNOSIS — I2510 Atherosclerotic heart disease of native coronary artery without angina pectoris: Secondary | ICD-10-CM | POA: Diagnosis not present

## 2019-08-16 DIAGNOSIS — Z7984 Long term (current) use of oral hypoglycemic drugs: Secondary | ICD-10-CM | POA: Diagnosis not present

## 2019-08-16 DIAGNOSIS — X58XXXA Exposure to other specified factors, initial encounter: Secondary | ICD-10-CM | POA: Diagnosis not present

## 2019-08-16 DIAGNOSIS — Z79899 Other long term (current) drug therapy: Secondary | ICD-10-CM | POA: Diagnosis not present

## 2019-08-16 LAB — BASIC METABOLIC PANEL
Anion gap: 10 (ref 5–15)
BUN: 12 mg/dL (ref 6–20)
CO2: 22 mmol/L (ref 22–32)
Calcium: 9.3 mg/dL (ref 8.9–10.3)
Chloride: 104 mmol/L (ref 98–111)
Creatinine, Ser: 0.94 mg/dL (ref 0.61–1.24)
GFR calc Af Amer: >60 mL/min (ref >60)
GFR calc non Af Amer: >60 mL/min (ref >60)
Glucose, Bld: 185 mg/dL — ABNORMAL HIGH (ref 70–99)
Potassium: 4.8 mmol/L (ref 3.5–5.1)
Sodium: 136 mmol/L (ref 135–145)

## 2019-08-17 LAB — NOVEL CORONAVIRUS, NAA (HOSP ORDER, SEND-OUT TO REF LAB; TAT 18-24 HRS): SARS-CoV-2, NAA: NOT DETECTED

## 2019-08-20 ENCOUNTER — Ambulatory Visit (HOSPITAL_BASED_OUTPATIENT_CLINIC_OR_DEPARTMENT_OTHER)
Admission: RE | Admit: 2019-08-20 | Discharge: 2019-08-20 | Disposition: A | Discharge status: Home / Self Care | Payer: 59 | Source: Ambulatory Visit | Attending: Orthopedic Surgery | Admitting: Orthopedic Surgery

## 2019-08-20 ENCOUNTER — Encounter (HOSPITAL_BASED_OUTPATIENT_CLINIC_OR_DEPARTMENT_OTHER)
Admission: RE | Disposition: A | Discharge status: Home / Self Care | Payer: Self-pay | Source: Ambulatory Visit | Attending: Orthopedic Surgery

## 2019-08-20 ENCOUNTER — Other Ambulatory Visit: Payer: Self-pay

## 2019-08-20 ENCOUNTER — Ambulatory Visit (HOSPITAL_BASED_OUTPATIENT_CLINIC_OR_DEPARTMENT_OTHER): Payer: 59 | Admitting: Anesthesiology

## 2019-08-20 ENCOUNTER — Encounter (HOSPITAL_BASED_OUTPATIENT_CLINIC_OR_DEPARTMENT_OTHER): Payer: Self-pay | Admitting: Anesthesiology

## 2019-08-20 DIAGNOSIS — Z7984 Long term (current) use of oral hypoglycemic drugs: Secondary | ICD-10-CM | POA: Insufficient documentation

## 2019-08-20 DIAGNOSIS — S83232A Complex tear of medial meniscus, current injury, left knee, initial encounter: Secondary | ICD-10-CM | POA: Diagnosis present

## 2019-08-20 DIAGNOSIS — S83242A Other tear of medial meniscus, current injury, left knee, initial encounter: Secondary | ICD-10-CM | POA: Diagnosis not present

## 2019-08-20 DIAGNOSIS — Z7982 Long term (current) use of aspirin: Secondary | ICD-10-CM | POA: Insufficient documentation

## 2019-08-20 DIAGNOSIS — M1712 Unilateral primary osteoarthritis, left knee: Secondary | ICD-10-CM | POA: Insufficient documentation

## 2019-08-20 DIAGNOSIS — G4730 Sleep apnea, unspecified: Secondary | ICD-10-CM | POA: Insufficient documentation

## 2019-08-20 DIAGNOSIS — Z6834 Body mass index (BMI) 34.0-34.9, adult: Secondary | ICD-10-CM | POA: Insufficient documentation

## 2019-08-20 DIAGNOSIS — I252 Old myocardial infarction: Secondary | ICD-10-CM | POA: Insufficient documentation

## 2019-08-20 DIAGNOSIS — I10 Essential (primary) hypertension: Secondary | ICD-10-CM | POA: Insufficient documentation

## 2019-08-20 DIAGNOSIS — Z955 Presence of coronary angioplasty implant and graft: Secondary | ICD-10-CM | POA: Insufficient documentation

## 2019-08-20 DIAGNOSIS — E119 Type 2 diabetes mellitus without complications: Secondary | ICD-10-CM | POA: Insufficient documentation

## 2019-08-20 DIAGNOSIS — E669 Obesity, unspecified: Secondary | ICD-10-CM | POA: Insufficient documentation

## 2019-08-20 DIAGNOSIS — F418 Other specified anxiety disorders: Secondary | ICD-10-CM | POA: Insufficient documentation

## 2019-08-20 DIAGNOSIS — X58XXXA Exposure to other specified factors, initial encounter: Secondary | ICD-10-CM | POA: Insufficient documentation

## 2019-08-20 DIAGNOSIS — I2510 Atherosclerotic heart disease of native coronary artery without angina pectoris: Secondary | ICD-10-CM | POA: Insufficient documentation

## 2019-08-20 DIAGNOSIS — M94262 Chondromalacia, left knee: Secondary | ICD-10-CM

## 2019-08-20 DIAGNOSIS — Z79899 Other long term (current) drug therapy: Secondary | ICD-10-CM | POA: Insufficient documentation

## 2019-08-20 DIAGNOSIS — E785 Hyperlipidemia, unspecified: Secondary | ICD-10-CM | POA: Insufficient documentation

## 2019-08-20 HISTORY — DX: Complex tear of medial meniscus, current injury, left knee, initial encounter: S83.232A

## 2019-08-20 HISTORY — PX: KNEE ARTHROSCOPY WITH MEDIAL MENISECTOMY: SHX5651

## 2019-08-20 HISTORY — DX: Chondromalacia, left knee: M94.262

## 2019-08-20 LAB — GLUCOSE, CAPILLARY
Glucose-Capillary: 138 mg/dL — ABNORMAL HIGH (ref 70–99)
Glucose-Capillary: 138 mg/dL — ABNORMAL HIGH (ref 70–99)

## 2019-08-20 SURGERY — ARTHROSCOPY, KNEE, WITH MEDIAL MENISCECTOMY
Anesthesia: General | Site: Knee | Laterality: Left

## 2019-08-20 MED ORDER — FENTANYL CITRATE (PF) 100 MCG/2ML IJ SOLN
INTRAMUSCULAR | Status: AC
  Filled 2019-08-20: qty 2

## 2019-08-20 MED ORDER — MIDAZOLAM HCL 2 MG/2ML IJ SOLN
1.0000 mg | INTRAMUSCULAR | Status: DC | PRN
  Administered 2019-08-20: 2 mg via INTRAVENOUS

## 2019-08-20 MED ORDER — CEFAZOLIN SODIUM-DEXTROSE 2-4 GM/100ML-% IV SOLN
2.0000 g | INTRAVENOUS | Status: AC
  Administered 2019-08-20: 2 g via INTRAVENOUS

## 2019-08-20 MED ORDER — ONDANSETRON HCL 4 MG/2ML IJ SOLN
INTRAMUSCULAR | Status: DC | PRN
  Administered 2019-08-20: 4 mg via INTRAVENOUS

## 2019-08-20 MED ORDER — DEXAMETHASONE SODIUM PHOSPHATE 4 MG/ML IJ SOLN
INTRAMUSCULAR | Status: DC | PRN
  Administered 2019-08-20: 5 mg via INTRAVENOUS

## 2019-08-20 MED ORDER — FENTANYL CITRATE (PF) 100 MCG/2ML IJ SOLN
50.0000 ug | INTRAMUSCULAR | Status: AC | PRN
  Administered 2019-08-20: 25 ug via INTRAVENOUS
  Administered 2019-08-20: 100 ug via INTRAVENOUS
  Administered 2019-08-20: 25 ug via INTRAVENOUS

## 2019-08-20 MED ORDER — EPINEPHRINE PF 1 MG/ML IJ SOLN
INTRAMUSCULAR | Status: AC
  Filled 2019-08-20: qty 2

## 2019-08-20 MED ORDER — PROPOFOL 10 MG/ML IV BOLUS
INTRAVENOUS | Status: AC
  Filled 2019-08-20: qty 20

## 2019-08-20 MED ORDER — ONDANSETRON HCL 4 MG/2ML IJ SOLN
INTRAMUSCULAR | Status: AC
  Filled 2019-08-20: qty 2

## 2019-08-20 MED ORDER — LIDOCAINE 2% (20 MG/ML) 5 ML SYRINGE
INTRAMUSCULAR | Status: AC
  Filled 2019-08-20: qty 5

## 2019-08-20 MED ORDER — BUPIVACAINE HCL (PF) 0.25 % IJ SOLN
INTRAMUSCULAR | Status: DC | PRN
  Administered 2019-08-20: 20 mL

## 2019-08-20 MED ORDER — CHLORHEXIDINE GLUCONATE 4 % EX LIQD: 60.0000 mL | Freq: Once | CUTANEOUS | Status: DC

## 2019-08-20 MED ORDER — HYDROCODONE-ACETAMINOPHEN 7.5-325 MG PO TABS: 1.0000 | ORAL_TABLET | Freq: Once | ORAL | Status: DC | PRN

## 2019-08-20 MED ORDER — LACTATED RINGERS IV SOLN
INTRAVENOUS | Status: DC
  Administered 2019-08-20: 11:00:00 via INTRAVENOUS

## 2019-08-20 MED ORDER — PROPOFOL 10 MG/ML IV BOLUS
INTRAVENOUS | Status: DC | PRN
  Administered 2019-08-20: 180 mg via INTRAVENOUS

## 2019-08-20 MED ORDER — HYDROCODONE-ACETAMINOPHEN 5-325 MG PO TABS: 1.0000 | ORAL_TABLET | Freq: Four times a day (QID) | ORAL | 0 refills | Status: DC | PRN

## 2019-08-20 MED ORDER — LIDOCAINE 2% (20 MG/ML) 5 ML SYRINGE
INTRAMUSCULAR | Status: DC | PRN
  Administered 2019-08-20: 100 mg via INTRAVENOUS

## 2019-08-20 MED ORDER — ONDANSETRON HCL 4 MG/2ML IJ SOLN: 4.0000 mg | Freq: Once | INTRAMUSCULAR | Status: DC | PRN

## 2019-08-20 MED ORDER — MEPERIDINE HCL 25 MG/ML IJ SOLN: 6.2500 mg | INTRAMUSCULAR | Status: DC | PRN

## 2019-08-20 MED ORDER — MIDAZOLAM HCL 2 MG/2ML IJ SOLN
INTRAMUSCULAR | Status: AC
  Filled 2019-08-20: qty 2

## 2019-08-20 MED ORDER — CEFAZOLIN SODIUM-DEXTROSE 2-4 GM/100ML-% IV SOLN
INTRAVENOUS | Status: AC
  Filled 2019-08-20: qty 100

## 2019-08-20 MED ORDER — SODIUM CHLORIDE 0.9 % IR SOLN
Status: DC | PRN
  Administered 2019-08-20: 1200 mL

## 2019-08-20 MED ORDER — FENTANYL CITRATE (PF) 100 MCG/2ML IJ SOLN: 25.0000 ug | INTRAMUSCULAR | Status: DC | PRN

## 2019-08-20 SURGICAL SUPPLY — 39 items
BLADE EXCALIBUR 4.0X13 (MISCELLANEOUS) ×3 IMPLANT
BNDG ELASTIC 6X5.8 VLCR STR LF (GAUZE/BANDAGES/DRESSINGS) ×2 IMPLANT
COVER WAND RF STERILE (DRAPES) IMPLANT
DISSECTOR 4.0MM X 13CM (MISCELLANEOUS) IMPLANT
DRAPE ARTHROSCOPY W/POUCH 90 (DRAPES) ×2 IMPLANT
DRSG EMULSION OIL 3X3 NADH (GAUZE/BANDAGES/DRESSINGS) ×2 IMPLANT
DURAPREP 26ML APPLICATOR (WOUND CARE) ×2 IMPLANT
ELECT MENISCUS 165MM 90D (ELECTRODE) IMPLANT
ELECT REM PT RETURN 9FT ADLT (ELECTROSURGICAL) ×2
ELECTRODE REM PT RTRN 9FT ADLT (ELECTROSURGICAL) IMPLANT
GAUZE SPONGE 4X4 12PLY STRL (GAUZE/BANDAGES/DRESSINGS) ×2 IMPLANT
GLOVE BIO SURGEON STRL SZ 6.5 (GLOVE) ×1 IMPLANT
GLOVE BIO SURGEON STRL SZ7 (GLOVE) ×1 IMPLANT
GLOVE BIOGEL PI IND STRL 6.5 (GLOVE) IMPLANT
GLOVE BIOGEL PI IND STRL 7.5 (GLOVE) IMPLANT
GLOVE BIOGEL PI IND STRL 8 (GLOVE) ×2 IMPLANT
GLOVE BIOGEL PI INDICATOR 6.5 (GLOVE) ×1
GLOVE BIOGEL PI INDICATOR 7.5 (GLOVE) ×1
GLOVE BIOGEL PI INDICATOR 8 (GLOVE) ×2
GLOVE ECLIPSE 7.5 STRL STRAW (GLOVE) ×4 IMPLANT
GOWN STRL REUS W/ TWL LRG LVL3 (GOWN DISPOSABLE) ×1 IMPLANT
GOWN STRL REUS W/ TWL XL LVL3 (GOWN DISPOSABLE) ×1 IMPLANT
GOWN STRL REUS W/TWL LRG LVL3 (GOWN DISPOSABLE) ×2
GOWN STRL REUS W/TWL XL LVL3 (GOWN DISPOSABLE) ×6 IMPLANT
HOLDER KNEE FOAM BLUE (MISCELLANEOUS) ×2 IMPLANT
KNEE WRAP E Z 3 GEL PACK (MISCELLANEOUS) ×2 IMPLANT
MANIFOLD NEPTUNE II (INSTRUMENTS) ×1 IMPLANT
NDL SAFETY ECLIPSE 18X1.5 (NEEDLE) IMPLANT
NEEDLE HYPO 18GX1.5 SHARP (NEEDLE)
PACK ARTHROSCOPY DSU (CUSTOM PROCEDURE TRAY) ×2 IMPLANT
PACK BASIN DAY SURGERY FS (CUSTOM PROCEDURE TRAY) ×2 IMPLANT
PAD CAST 4YDX4 CTTN HI CHSV (CAST SUPPLIES) ×1 IMPLANT
PADDING CAST COTTON 4X4 STRL (CAST SUPPLIES) ×2
PENCIL SMOKE EVACUATOR (MISCELLANEOUS) IMPLANT
SUT ETHILON 4 0 PS 2 18 (SUTURE) IMPLANT
SYR 5ML LL (SYRINGE) ×2 IMPLANT
TOWEL GREEN STERILE FF (TOWEL DISPOSABLE) ×2 IMPLANT
TUBING ARTHROSCOPY IRRIG 16FT (MISCELLANEOUS) ×2 IMPLANT
WATER STERILE IRR 1000ML POUR (IV SOLUTION) ×2 IMPLANT

## 2019-08-20 NOTE — Transfer of Care (Signed)
Immediate Anesthesia Transfer of Care Note  Patient: William Mckenzie  Procedure(s) Performed: ARTHROSCOPY KNEE, partial medial menisectomy, chondroplasty (Left Knee)  Patient Location: PACU  Anesthesia Type:General  Level of Consciousness: sedated  Airway & Oxygen Therapy: Patient Spontanous Breathing and Patient connected to face mask oxygen  Post-op Assessment: Report given to RN and Post -op Vital signs reviewed and stable  Post vital signs: Reviewed and stable  Last Vitals:  Vitals Value Taken Time  BP 127/79 08/20/19 1315  Temp    Pulse 92 08/20/19 1316  Resp 14 08/20/19 1316  SpO2 100 % 08/20/19 1316  Vitals shown include unvalidated device data.  Last Pain:  Vitals:   08/20/19 1028  TempSrc: Temporal  PainSc: 0-No pain         Complications: No apparent anesthesia complications

## 2019-08-20 NOTE — H&P (Signed)
A pre op hand p   Chief Complaint: Left knee pain  HPI: William Mckenzie is a 53 y.o. male who presents for evaluation of left knee pain. It has been present for greater than 3 months and has been worsening. He has failed conservative measures. Pain is rated as moderate.  Past Medical History:  Diagnosis Date  . Acute meniscal tear, medial    Left knee  . Anxiety   . CAD (coronary artery disease)    prior inferior MI in 2010 treated with stenting of the right PDA; had nonobstructive disease in the LAD and LCX with approximately 40% lesions. EF normal  . Diabetes mellitus without complication ([place])   . Hyperlipidemia   . Hypertension    white coat   . Myocardial infarction ([place])   . Obesity    Past Surgical History:  Procedure Laterality Date  . CARDIAC CATHETERIZATION    . CORONARY ANGIOPLASTY    . KNEE SURGERY Left 12/2010   LEFT, meniscus . arthroscopy  . KNEE SURGERY Left 11/2014   Meniscus, medial   Social History   Socioeconomic History  . Marital status: Married    Spouse name: Not on file  . Number of children: 1  . Years of education: Not on file  . Highest education level: Not on file  Occupational History  . Occupation: former Corporate treasurer  . Occupation: CUSTOMER SERVICE [employer]    Employer: [employer]  Social Needs  . Financial resource strain: Not on file  . Food insecurity    Worry: Not on file    Inability: Not on file  . Transportation needs    Medical: Not on file    Non-medical: Not on file  Tobacco Use  . Smoking status: Never Smoker  . Smokeless tobacco: Former Systems developer    Types: Chew  . Tobacco comment: quit chewing tobacco 2010   Substance and Sexual Activity  . Alcohol use: Yes    Comment: socially   . Drug use: No  . Sexual activity: Not on file  Lifestyle  . Physical activity    Days per week: Not on file    Minutes per session: Not on file  . Stress: Not on file  Relationships  . Social Herbalist on phone: Not on file    Gets  together: Not on file    Attends religious service: Not on file    Active member of club or organization: Not on file    Attends meetings of clubs or organizations: Not on file    Relationship status: Not on file  Other Topics Concern  . Not on file  Social History Narrative   Lives w/ wife and daughter     Active martial arts      Family History  Problem Relation Age of Onset  . Hypertension Mother   . Heart disease Father   . CAD Other        PGGF  . Heart disease Paternal Grandfather   . Colon cancer Neg Hx   . Prostate cancer Neg Hx   . Diabetes Neg Hx   . Colon polyps Neg Hx   . Esophageal cancer Neg Hx   . Rectal cancer Neg Hx   . Stomach cancer Neg Hx    No Known Allergies Prior to Admission medications   Medication Sig Start Date End Date Taking? Authorizing Provider  aspirin 81 MG tablet Take 1 tablet (81 mg total) by mouth daily. 01/08/13  Yes  Sherren Mocha, MD  atorvastatin (LIPITOR) 40 MG tablet Take 1 tablet (40 mg total) by mouth daily at 6 PM. 08/17/19  Yes Sherren Mocha, MD  buPROPion [facility] SR) 150 MG 12 hr tablet Take 1 tablet (150 mg total) by mouth 2 (two) times daily. 07/25/19  Yes Paz, Alda Berthold, MD  empagliflozin (JARDIANCE) 25 MG TABS tablet Take 25 mg by mouth daily. 06/16/18  Yes Colon Branch, MD  metFORMIN (GLUCOPHAGE) 1000 MG tablet Take 1 tablet (1,000 mg total) by mouth 2 (two) times daily with a meal. 07/31/18  Yes Paz, Alda Berthold, MD  metoprolol succinate (TOPROL-XL) 25 MG 24 hr tablet TAKE 1 TABLET BY MOUTH EVERY DAY 08/16/19  Yes Sherren Mocha, MD  pioglitazone (ACTOS) 30 MG tablet Take 1 tablet (30 mg total) by mouth daily. 07/25/19  Yes Paz, Alda Berthold, MD  Semaglutide (RYBELSUS) 7 MG TABS Take 7 mg by mouth daily. Thereafter 06/25/19  Yes Paz, Alda Berthold, MD  nitroGLYCERIN (NITROSTAT) 0.4 MG SL tablet Place 1 tablet (0.4 mg total) under the tongue every 5 (five) minutes as needed. 01/30/18   Leanor Kail, PA  ONE TOUCH LANCETS MISC Check once  daily. 11/22/12   Colon Branch, MD     Positive ROS: None  All other systems have been reviewed and were otherwise negative with the exception of those mentioned in the HPI and as above.  Physical Exam: Vitals:   08/20/19 1028  BP: 136/75  Pulse: 77  Resp: 18  Temp: (!) 97.1 F (36.2 C)  SpO2: (!) 77%    General: Alert, no acute distress Cardiovascular: No pedal edema Respiratory: No cyanosis, no use of accessory musculature GI: No organomegaly, abdomen is soft and non-tender Skin: No lesions in the area of chief complaint Neurologic: Sensation intact distally Psychiatric: Patient is competent for consent with normal mood and affect Lymphatic: No axillary or cervical lymphadenopathy  MUSCULOSKELETAL: Left knee: Painful range of motion.  Limited range of motion.  Trace effusion.  No instability.    Assessment/Plan: MEDIAL MENISCUS TEAR W/DEGENERATIVE JOINT DISEASE LEFT KNEE Plan for Procedure(s): ARTHROSCOPY KNEE  The risks benefits and alternatives were discussed with the patient including but not limited to the risks of nonoperative treatment, versus surgical intervention including infection, bleeding, nerve injury, malunion, nonunion, hardware prominence, hardware failure, need for hardware removal, blood clots, cardiopulmonary complications, morbidity, mortality, among others, and they were willing to proceed.  Predicted outcome is good, although there will be at least a six to nine month expected recovery.  Alta Corning, MD 08/20/2019 12:09 PM

## 2019-08-20 NOTE — Anesthesia Procedure Notes (Signed)
Procedure Name: LMA Insertion Date/Time: 08/20/2019 12:21 PM Performed by: Maryella Shivers, CRNA Pre-anesthesia Checklist: Patient identified, Emergency Drugs available, Suction available and Patient being monitored Patient Re-evaluated:Patient Re-evaluated prior to induction Oxygen Delivery Method: Circle system utilized Preoxygenation: Pre-oxygenation with 100% oxygen Induction Type: IV induction Ventilation: Mask ventilation without difficulty LMA: LMA inserted LMA Size: 5.0 Number of attempts: 1 Airway Equipment and Method: Bite block Placement Confirmation: positive ETCO2 Tube secured with: Tape Dental Injury: Teeth and Oropharynx as per pre-operative assessment

## 2019-08-20 NOTE — Brief Op Note (Signed)
08/20/2019  8:28 PM  PATIENT:  William Mckenzie  53 y.o. male  PRE-OPERATIVE DIAGNOSIS:  MEDIAL MENISCUS TEAR W/DEGENERATIVE JOINT DISEASE LEFT KNEE  POST-OPERATIVE DIAGNOSIS:  MEDIAL MENISCUS TEAR W/DEGENERATIVE JOINT DISEASE LEFT KNEE  PROCEDURE:  Procedure(s): ARTHROSCOPY KNEE, partial medial menisectomy, chondroplasty (Left)  SURGEON:  Surgeon(s) and Role:    Dorna Leitz, MD - Primary  PHYSICIAN ASSISTANT:   ASSISTANTS: jim bethune   ANESTHESIA:   general  EBL:  minimal  BLOOD ADMINISTERED:none  DRAINS: none   LOCAL MEDICATIONS USED:  MARCAINE     SPECIMEN:  No Specimen  DISPOSITION OF SPECIMEN:  N/A  COUNTS:  YES  TOURNIQUET:  * No tourniquets in log *  DICTATION: .Other Dictation: Dictation Number [id number]  PLAN OF CARE: Discharge to home after PACU  PATIENT DISPOSITION:  PACU - hemodynamically stable.   Delay start of Pharmacological VTE agent (>24hrs) due to surgical blood loss or risk of bleeding: no

## 2019-08-20 NOTE — Anesthesia Preprocedure Evaluation (Signed)
Anesthesia Evaluation  Patient identified by MRN, date of birth, ID band Patient awake    Reviewed: Allergy & Precautions, NPO status , Patient's Chart, lab work & pertinent test results, reviewed documented beta blocker date and time   Airway Mallampati: II  TM Distance: >3 FB Neck ROM: Full    Dental no notable dental hx. (+) Teeth Intact   Pulmonary sleep apnea and Continuous Positive Airway Pressure Ventilation ,    Pulmonary exam normal breath sounds clear to auscultation       Cardiovascular hypertension, Pt. on medications and Pt. on home beta blockers + angina with exertion + CAD and + Past MI  Normal cardiovascular exam Rhythm:Regular Rate:Normal  Inferior MI 2010 PCI in 2010 with stent PDA  EKG 08/09/2019 NSR, Rightward axis  ECHO 11/05/2014 Left ventricle: The cavity size was normal. Wall thickness was normal. Systolic function was normal. The estimated ejection fraction was in the range of 55% to 60%. Wall motion was normal; there were no regional wall motion abnormalities.  - Right ventricle: The cavity size was normal. Systolic function was normal.   Myocardial Perfusion Imaging 08/15/2019  The left ventricular ejection fraction is normal (55-65%).  Nuclear stress EF: 60%.  There was no ST segment deviation noted during stress.  No T wave inversion was noted during stress.  The study is normal.  This is a low risk study.   Neuro/Psych PSYCHIATRIC DISORDERS Anxiety Depression  Neuromuscular disease    GI/Hepatic negative GI ROS, Neg liver ROS,   Endo/Other  diabetes, Well Controlled, Type 2, Oral Hypoglycemic AgentsHyperlipidemia Obesity  Renal/GU   negative genitourinary   Musculoskeletal negative musculoskeletal ROS (+) Torn medial meniscus left knee   Abdominal (+) + obese,   Peds  Hematology negative hematology ROS (+)   Anesthesia Other Findings   Reproductive/Obstetrics                              Anesthesia Physical Anesthesia Plan  ASA: III  Anesthesia Plan: General   Post-op Pain Management:    Induction: Intravenous  PONV Risk Score and Plan: 3 and Ondansetron, Dexamethasone and Treatment may vary due to age or medical condition  Airway Management Planned: LMA  Additional Equipment:   Intra-op Plan:   Post-operative Plan: Extubation in OR  Informed Consent: I have reviewed the patients History and Physical, chart, labs and discussed the procedure including the risks, benefits and alternatives for the proposed anesthesia with the patient or authorized representative who has indicated his/her understanding and acceptance.     Dental advisory given  Plan Discussed with: CRNA and Surgeon  Anesthesia Plan Comments:         Anesthesia Quick Evaluation

## 2019-08-20 NOTE — Discharge Instructions (Signed)
POST-OP KNEE ARTHROSCOPY INSTRUCTIONS  °Dr. John Graves/Jim Bethune PA-C ° °Pain °You will be expected to have a moderate amount of pain in the affected knee for approximately two weeks. However, the first two days will be the most severe pain. A prescription has been provided to take as needed for the pain. The pain can be reduced by applying ice packs to the knee for the first 1-2 weeks post surgery. Also, keeping the leg elevated on pillows will help alleviate the pain. If you develop any acute pain or swelling in your calf muscle, please call the doctor. ° °Activity °It is preferred that you stay at bed rest for approximately 24 hours. However, you may go to the bathroom with help. Weight bearing as tolerated. You may begin the knee exercises the day of surgery. Discontinue crutches as the knee pain resolves. ° °Dressing °Keep the dressing dry. If the ace bandage should wrinkle or roll up, this can be rewrapped to prevent ridges in the bandage. You may remove all dressings in 48 hours,  apply bandaids to each wound. You may shower on the 4th day after surgery but no tub bath. ° °Symptoms to report to your doctor °Extreme pain °Extreme swelling °Temperature above 101 degrees °Change in the feeling, color, or movement of your toes °Redness, heat, or swelling at your incision ° °Exercise °If is preferred that as soon as possible you try to do a straight leg raise without bending the knee and concentrate on bringing the heel of your foot off the bed up to approximately 45 degrees and hold for the count of 10 seconds. Repeat this at least 10 times three or four times per day. Additional exercises are provided below. ° °You are encouraged to bend the knee as tolerated. ° °Follow-Up °Call to schedule a follow-up appointment in 5-7 days. Our office # is [phone]. ° °POST-OP EXERCISES ° °Short Arc Quads ° °1. Lie on back with legs straight. Place towel roll under thigh, just above knee. °2. Tighten thigh muscles to  straighten knee and lift heel off bed. °3. Hold for slow count of five, then lower. °4. Do three sets of ten ° ° ° °Straight Leg Raises ° °1. Lie on back with operative leg straight and other leg bent. °2. Keeping operative leg completely straight, slowly lift operative leg so foot is 5 inches off bed. °3. Hold for slow count of five, then lower. °4. Do three sets of ten. ° ° ° °DO BOTH EXERCISES 2 TIMES A DAY ° °Ankle Pumps ° °Work/move the operative ankle and foot up and down 10 times every hour while awake. ° ° °Post Anesthesia Home Care Instructions ° °Activity: °Get plenty of rest for the remainder of the day. A responsible individual must stay with you for 24 hours following the procedure.  °For the next 24 hours, DO NOT: °-Drive a car °-Operate machinery °-Drink alcoholic beverages °-Take any medication unless instructed by your physician °-Make any legal decisions or sign important papers. ° °Meals: °Start with liquid foods such as gelatin or soup. Progress to regular foods as tolerated. Avoid greasy, spicy, heavy foods. If nausea and/or vomiting occur, drink only clear liquids until the nausea and/or vomiting subsides. Call your physician if vomiting continues. ° °Special Instructions/Symptoms: °Your throat may feel dry or sore from the anesthesia or the breathing tube placed in your throat during surgery. If this causes discomfort, gargle with warm salt water. The discomfort should disappear within 24 hours. ° °If you   had a scopolamine patch placed behind your ear for the management of post- operative nausea and/or vomiting: ° °1. The medication in the patch is effective for 72 hours, after which it should be removed.  Wrap patch in a tissue and discard in the trash. Wash hands thoroughly with soap and water. °2. You may remove the patch earlier than 72 hours if you experience unpleasant side effects which may include dry mouth, dizziness or visual disturbances. °3. Avoid touching the patch. Wash your  hands with soap and water after contact with the patch. °  ° °

## 2019-08-20 NOTE — Anesthesia Postprocedure Evaluation (Signed)
Anesthesia Post Note  Patient: William Mckenzie  Procedure(s) Performed: ARTHROSCOPY KNEE, partial medial menisectomy, chondroplasty (Left Knee)     Patient location during evaluation: PACU Anesthesia Type: General Level of consciousness: awake and alert and oriented Pain management: pain level controlled Vital Signs Assessment: post-procedure vital signs reviewed and stable Respiratory status: spontaneous breathing, nonlabored ventilation and respiratory function stable Cardiovascular status: blood pressure returned to baseline and stable Postop Assessment: no apparent nausea or vomiting Anesthetic complications: no    Last Vitals:  Vitals:   08/20/19 1313 08/20/19 1315  BP: 128/78 127/79  Pulse: 94 87  Resp: (!) 27 14  Temp:  (!) 36.4 C  SpO2: 100% 100%    Last Pain:  Vitals:   08/20/19 1315  TempSrc:   PainSc: Asleep                 Oswin Griffith A.

## 2019-08-21 NOTE — Op Note (Signed)
NAME: CATON, TEFFT MEDICAL RECORD [id number] ACCOUNT [id number] DATE OF BIRTH:1965-11-05 FACILITY: [facility] LOCATION: [facility] PHYSICIAN:Madason Rauls L. Jernard Reiber, MD  OPERATIVE REPORT  DATE OF PROCEDURE:  08/20/2019  PREOPERATIVE DIAGNOSIS:  Meniscal tear medial with possible chondromalacia.  POSTOPERATIVE DIAGNOSIS:  Meniscal tear medial with severe chondromalaciaof the medial compartment with large area of exposed bone and flaking articular cartilage.    PRINCIPAL PROCEDURES: 1.  Left knee arthroscopy with partial medial meniscectomy. 2.  Chondroplasty and abrasion arthroplasty of the medial femoral condyle and medial tibial plateau down to bleeding bone which is both medically indicated and necessary based on the pathology in the medial compartment.  SURGEON:  Dorna Leitz, MD  ASSISTANT:  Gaspar Skeeters, PA-C, was present for the entire case and assisted by manipulation of the leg, and closing to minimize OR time.  BRIEF HISTORY:  The patient is a 53 year old male with a long history of significant complaints of left knee pain.  He had been treated conservatively for a prolonged period of time.  He had had arthroscopy 8 years ago and 4 years ago and had done well  after his arthroscopies.  He was having significant catching and grabbing pain on the medial aspect of his knee and after failure of conservative care is taken to the operating room for operative knee arthroscopy.  We talked about preoperative MRI, but  felt that it was not needed based on his symptoms and the fact that we felt that he was going to have additional chondral injury.  He was brought to the operating room for this procedure.  DESCRIPTION OF PROCEDURE:  The patient was brought to the operating room after adequate anesthesia was obtained with a general anesthetic patient placed supine on the operating table.  Left leg was prepped and draped in usual sterile fashion.  Following  this, routine arthroscopic  examination of the knee revealed that there was no significant chondromalacia of the patellofemoral joint.  Attention was turned to the medial compartment where there was significant chondromalacia of the medial femoral condyle  and the medial tibial plateau.  There were significant areas of cartilage which was flaking off.  I used a probe, straight biting forcep, and a suction shaver to debride this area down, particularly in the medial tibial plateau.  This created an area of  exposed bone, probably 2 x 6 mm.  The patient was not prior authorized for chondroplasty although we had tried to get this done.  I was afraid that we would find the pathology that we did find intraoperatively.  He absolutely needed debridement of his articular cartilage and abrasion arthroplasty on the tibial side because of areas of exposed bone and flaking articular cartilage in this area.  An extensive amount of time was performed using a shaver, probed, biters etc. To debride this area to a smooth and stable rim of articular cartilage.Once this was completed, attention was turned to the medial meniscus where there was fraying of the medial meniscus, both in the mid body and around posteriorly.  We did a partial medial meniscectomy, which debrided  this meniscus back to a smooth and stable rim.  Once this was completed, attention was turned towards the ACL, which was normal.  Attention was turned to the lateral side, which was normal.  At this point, the knee was copiously and thoroughly irrigated  with pulsatile lavage irrigation and suctioned dry.  At this point, a sterile compressive dressing was applied as well as 20 mL of Marcaine for postoperative  anesthesia.  The patient was taken to recovery room and was noted to be in satisfactory  condition.  Estimated blood loss for the procedure was minimal.  CN/NUANCE  D:08/20/2019 T:08/21/2019 JOB:[id number]

## 2019-08-22 ENCOUNTER — Encounter (HOSPITAL_BASED_OUTPATIENT_CLINIC_OR_DEPARTMENT_OTHER): Payer: Self-pay | Admitting: Orthopedic Surgery

## 2019-08-27 ENCOUNTER — Telehealth: Payer: Self-pay

## 2019-08-27 NOTE — Telephone Encounter (Signed)
The patient has been notified of the result and verbalized understanding.  All questions (if any) were answered. Wilma Flavin, RN 08/27/2019 9:37 AM

## 2019-08-27 NOTE — Telephone Encounter (Signed)
-----   Message from Tommie Raymond, NP sent at 08/24/2019  9:52 AM EST ----- Please let the patient know that his stress test was normal with normal squeeze function and no ischemia or infarct noted. Great news. No changes need to be made.   Thank you Sharee Pimple

## 2019-10-02 ENCOUNTER — Encounter: Payer: 59 | Admitting: Internal Medicine

## 2019-10-14 ENCOUNTER — Other Ambulatory Visit: Payer: Self-pay | Admitting: Internal Medicine

## 2019-10-30 ENCOUNTER — Other Ambulatory Visit: Payer: Self-pay

## 2019-10-30 ENCOUNTER — Encounter: Payer: Self-pay | Admitting: Internal Medicine

## 2019-10-30 ENCOUNTER — Ambulatory Visit (INDEPENDENT_AMBULATORY_CARE_PROVIDER_SITE_OTHER): Payer: 59 | Admitting: Internal Medicine

## 2019-10-30 VITALS — BP 137/75 | HR 73 | Temp 95.6°F | Resp 16 | Ht 72.0 in | Wt 252.2 lb

## 2019-10-30 DIAGNOSIS — E785 Hyperlipidemia, unspecified: Secondary | ICD-10-CM

## 2019-10-30 DIAGNOSIS — I2510 Atherosclerotic heart disease of native coronary artery without angina pectoris: Secondary | ICD-10-CM | POA: Diagnosis not present

## 2019-10-30 DIAGNOSIS — Z0000 Encounter for general adult medical examination without abnormal findings: Secondary | ICD-10-CM | POA: Diagnosis not present

## 2019-10-30 DIAGNOSIS — E1159 Type 2 diabetes mellitus with other circulatory complications: Secondary | ICD-10-CM | POA: Diagnosis not present

## 2019-10-30 LAB — LIPID PANEL
Cholesterol: 116 mg/dL (ref 0–200)
HDL: 49.5 mg/dL (ref >39.00)
LDL Cholesterol: 52 mg/dL (ref 0–99)
NonHDL: 66.54
Total CHOL/HDL Ratio: 2
Triglycerides: 75 mg/dL (ref 0.0–149.0)
VLDL: 15 mg/dL (ref 0.0–40.0)

## 2019-10-30 LAB — CBC WITH DIFFERENTIAL/PLATELET
Basophils Absolute: 0 10*3/uL (ref 0.0–0.1)
Basophils Relative: 0.3 % (ref 0.0–3.0)
Eosinophils Absolute: 0.1 10*3/uL (ref 0.0–0.7)
Eosinophils Relative: 1 % (ref 0.0–5.0)
HCT: 43.5 % (ref 39.0–52.0)
Hemoglobin: 14.5 g/dL (ref 13.0–17.0)
Lymphocytes Relative: 40.9 % (ref 12.0–46.0)
Lymphs Abs: 2.3 10*3/uL (ref 0.7–4.0)
MCHC: 33.4 g/dL (ref 30.0–36.0)
MCV: 92.2 fl (ref 78.0–100.0)
Monocytes Absolute: 0.4 10*3/uL (ref 0.1–1.0)
Monocytes Relative: 7.6 % (ref 3.0–12.0)
Neutro Abs: 2.8 10*3/uL (ref 1.4–7.7)
Neutrophils Relative %: 50.2 % (ref 43.0–77.0)
Platelets: 211 10*3/uL (ref 150.0–400.0)
RBC: 4.71 Mil/uL (ref 4.22–5.81)
RDW: 12.9 % (ref 11.5–15.5)
WBC: 5.5 10*3/uL (ref 4.0–10.5)

## 2019-10-30 LAB — COMPREHENSIVE METABOLIC PANEL
ALT: 27 U/L (ref 0–53)
AST: 16 U/L (ref 0–37)
Albumin: 4.5 g/dL (ref 3.5–5.2)
Alkaline Phosphatase: 58 U/L (ref 39–117)
BUN: 15 mg/dL (ref 6–23)
CO2: 29 mEq/L (ref 19–32)
Calcium: 9.6 mg/dL (ref 8.4–10.5)
Chloride: 103 mEq/L (ref 96–112)
Creatinine, Ser: 1.17 mg/dL (ref 0.40–1.50)
GFR: 65.03 mL/min (ref >60.00)
Glucose, Bld: 181 mg/dL — ABNORMAL HIGH (ref 70–99)
Potassium: 3.9 mEq/L (ref 3.5–5.1)
Sodium: 141 mEq/L (ref 135–145)
Total Bilirubin: 0.6 mg/dL (ref 0.2–1.2)
Total Protein: 6.6 g/dL (ref 6.0–8.3)

## 2019-10-30 LAB — HEMOGLOBIN A1C: Hgb A1c MFr Bld: 9.9 % — ABNORMAL HIGH (ref 4.6–6.5)

## 2019-10-30 LAB — PSA: PSA: 0.13 ng/mL (ref 0.10–4.00)

## 2019-10-30 MED ORDER — NITROGLYCERIN 0.4 MG SL SUBL: 0.4000 mg | SUBLINGUAL_TABLET | SUBLINGUAL | 3 refills | Status: DC | PRN

## 2019-10-30 NOTE — Patient Instructions (Addendum)
Per our records you are due for an eye exam. Please contact your eye doctor to schedule an appointment. Please have them send copies of your office visit notes to [place]. Our fax number is [phone].  GO TO THE LAB : Get the blood work     GO TO [place] back for a checkup in 3 months, please make an appointment     Check the  blood pressure for 2 weeks BP GOAL is between 110/65 and  135/85. If it is consistently higher or lower, let me know    =============   We are committed to keeping you informed about the COVID-19 vaccine.  As the vaccine continues to become available for each phase, we will ensure that patients who meet the criteria receive the information they need to access vaccination opportunities. Continue to check your MyChart account and [email or web address] for updates. Please review the Phase 1b information below.  [facility] Guerra On Tuesday, Jan. 19, the [facility]) and [place] began large-scale COVID-19 vaccinations at the [facility]. The vaccinations are appointment only and for those 51 and older.  Walk-ins will not be accepted.  All appointments are currently filled. Please join our waiting list for the next available appointments. We will contact you when appointments become available. Please do not sign up more than once.  Join Our Waiting List  Our daily vaccination capacity will continue to increase, including expansion of our [employer] site, increasing mobile clinics across our service region and plans to open COVID-19 vaccination clinics in [place] and [place] counties in February.  On Friday, Jan. 22, we announced the need to reschedule 10,400 vaccination appointments because of the state's decision not to allocate [facility] with COVID-19 vaccines for the week of Jan. 25. Our announcement of this news is available  here.  Going forward, we will schedule vaccination appointments weekly based on vaccine allocations provided by the state.  On Friday, Jan. 29, the [place] announced that [employer] and [place] will again be receiving vaccines. Our announcement of this news is available here.  We will first schedule vaccination appointments for those whose appointments were cancelled last week, then schedule those on the [place] waiting list in the order in which they registered. Those who had their appointment cancelled should expect notification by email (or by phone for those without email) at least 48 hours prior to their rescheduled appointment.  Other Vaccination Opportunities in [place] We are also working in partnership with county health agencies in our service counties to ensure continuing vaccination availability in the weeks and months ahead. Learn more about each county's vaccination efforts in the website links below:   [place]'s phase 1b vaccination guidelines, prioritizing those 65 and over as the next eligible group to receive the COVID-19 vaccine, are detailed at MobCommunity.ch.   Vaccine Safety and Effectiveness Clinical trials for the Pfizer COVID-19 vaccine involved 42,000 people and showed that the vaccine is more than 95% effective in preventing COVID-19 with no serious safety concerns. Similar results have been reported for the Moderna COVID-19 vaccine. Side effects reported in the [place] clinical trials include a sore arm at the injection site, fatigue, headache, chills and fever. While side effects from the [place] COVID-19 vaccine are higher than  for a typical flu vaccine, they are lower in many ways than side effects from the leading vaccine to prevent shingles. Side effects are signs that a vaccine is working and are related to  your immune system being stimulated to produce antibodies against infection. Side effects from vaccination are far less significant than health impacts from COVID-19.  Staying Informed Pharmacists, infectious disease doctors, critical care nurses and other experts at [facility] continue to speak publicly through media interviews and direct communication with our patients and communities about the safety, effectiveness and importance of vaccines to eliminate COVID-19. In addition, reliable information on vaccine safety, effectiveness, side effects and more is available on the following websites:  [place] Department of Health and Human Services COVID-19 Vaccine Information Website.  U.S. Centers for Disease Control and Prevention [id number] Human resources officer.  Staying Safe We agree with the CDC on what we can do to help our communities get back to normal: Getting "back to normal" is going to take all of our tools. If we use all the tools we have, we stand the best chance of getting our families, communities, schools and workplaces "back to normal" sooner:  Get vaccinated as soon as vaccines become available within the phase of the state's vaccination rollout plan for which you meet the eligibility criteria.  Wear a mask.  Stay 6 feet from others and avoid crowds.  Wash hands often.  For our most current information, please visit [email or web address].

## 2019-10-30 NOTE — Progress Notes (Signed)
Subjective:    Patient ID: William Mckenzie, male    DOB: 1965-10-25, 54 y.o.   MRN: [id number]  DOS:  10/30/2019 Type of visit - description: CPX In general feeling well. Reports 20 pounds weight loss on his own scales. Has improved his diet, not exercising much due to recent knee procedure. Denies blurred vision, polyuria, polydipsia.  Review of Systems  Other than above, a 14 point review of systems is negative     Past Medical History:  Diagnosis Date  . Acute meniscal tear, medial    Left knee  . Anxiety   . CAD (coronary artery disease)    prior inferior MI in 2010 treated with stenting of the right PDA; had nonobstructive disease in the LAD and LCX with approximately 40% lesions. EF normal  . Diabetes mellitus without complication ([place])   . Hyperlipidemia   . Hypertension    white coat   . Myocardial infarction ([place])   . Obesity     Past Surgical History:  Procedure Laterality Date  . CARDIAC CATHETERIZATION    . CORONARY ANGIOPLASTY    . KNEE ARTHROSCOPY WITH MEDIAL MENISECTOMY Left 08/20/2019   Procedure: ARTHROSCOPY KNEE, partial medial menisectomy, chondroplasty;  Surgeon: Dorna Leitz, MD;  Location: [facility];  Service: Orthopedics;  Laterality: Left;  . KNEE SURGERY Left 12/2010   LEFT, meniscus . arthroscopy  . KNEE SURGERY Left 11/2014   Meniscus, medial   Family History  Problem Relation Age of Onset  . Hypertension Mother   . Heart disease Father   . CAD Other        PGGF  . Heart disease Paternal Grandfather   . Colon cancer Neg Hx   . Prostate cancer Neg Hx   . Diabetes Neg Hx   . Colon polyps Neg Hx   . Esophageal cancer Neg Hx   . Rectal cancer Neg Hx   . Stomach cancer Neg Hx         Objective:   Physical Exam BP 137/75 (BP Location: Left Arm, Patient Position: Sitting, Cuff Size: Normal)   Pulse 73   Temp (!) 95.6 F (35.3 C) (Temporal)   Resp 16   Ht 6' (1.829 m)   Wt 252 lb 4 oz (114.4 kg)   SpO2  97%   BMI 34.21 kg/m  General: Well developed, NAD, BMI noted Neck: No  thyromegaly  HEENT:  Normocephalic . Face symmetric, atraumatic Lungs:  CTA B Normal respiratory effort, no intercostal retractions, no accessory muscle use. Heart: RRR,  no murmur.  No pretibial edema bilaterally  Abdomen:  Not distended, soft, non-tender. No rebound or rigidity.   Skin: Exposed areas without rash. Not pale. Not jaundice DRE: Normal sphincter tone, empty vault, unable to reach the prostate gland Neurologic:  alert & oriented X3.  Speech normal, gait appropriate for age and unassisted Strength symmetric and appropriate for age.  Psych: Cognition and judgment appear intact.  Cooperative with normal attention span and concentration.  Behavior appropriate. No anxious or depressed appearing.     Assessment    Assessment DM w/ CAD HTN Hyperlipidemia Depression-Anxiety CAD, MI 2010,  Angioplasty; nuclear scan-echo ok 2016.  Myoview 11-20 20: Normal Epididymitis -UTI E Coli (first episode 08/2018)  PLAN: Here for CPX DM: CBGs when checked in the low 100s but they are not checked frequently.  He is not taking Jardiance or Rybelsus, not clear why, apparently they were not available one time at  the pharmacy  and he never went back.  On Metformin, Actos.  Has lost 20 pounds according to hallucis scales, checking A1c.  Further advised with results. Strongly encourage compliance: If unable to get a medication to call this office. HTN:On metoprolol, BP today satisfactory CAD: Last visit 08/15/19, EKG showed pronounced TW inversions lead III but overall felt stable.  Asymptomatic RTC 3 months   This visit occurred during the SARS-CoV-2 public health emergency.  Safety protocols were in place, including screening questions prior to the visit, additional usage of staff PPE, and extensive cleaning of exam room while observing appropriate contact time as indicated for disinfecting solutions.

## 2019-10-30 NOTE — Progress Notes (Signed)
Pre visit review using our clinic review tool, if applicable. No additional management support is needed unless otherwise documented below in the visit note. 

## 2019-10-31 NOTE — Assessment & Plan Note (Signed)
Here for CPX DM: CBGs when checked in the low 100s but they are not checked frequently.  He is not taking Jardiance or Rybelsus, not clear why, apparently they were not available one time at  the pharmacy and he never went back.  On Metformin, Actos.  Has lost 20 pounds according to hallucis scales, checking A1c.  Further advised with results. Strongly encourage compliance: If unable to get a medication to call this office. HTN:On metoprolol, BP today satisfactory CAD: Last visit 09-03-2019, EKG showed pronounced TW inversions lead III but overall felt stable.  Asymptomatic RTC 3 months

## 2019-10-31 NOTE — Assessment & Plan Note (Signed)
Tdap 2017  pnm 23 shot 2015, Prevnar 2016 Had a flu shot  CCS: Cscope 2018, next per GI Prostate cancer screening: DRE: Unable to reach the prostate gland otherwise normal,  check a PSA Lifestyle: Unable to exercise recently due to knee surgery back is getting better, encouraged to start walking program. Diet discussed Labs: CMP, FLP, CBC, A1c, PSA

## 2019-11-01 MED ORDER — RYBELSUS 3 MG PO TABS: 3.0000 mg | ORAL_TABLET | Freq: Every day | ORAL | 0 refills | Status: DC

## 2019-11-01 NOTE — Addendum Note (Signed)
Addended byDamita Dunnings D on: 11/01/2019 12:43 PM   Modules accepted: Orders

## 2020-01-25 ENCOUNTER — Other Ambulatory Visit: Payer: Self-pay | Admitting: Internal Medicine

## 2020-01-29 ENCOUNTER — Ambulatory Visit: Payer: 59 | Admitting: Internal Medicine

## 2020-03-03 ENCOUNTER — Encounter: Payer: Self-pay | Admitting: Internal Medicine

## 2020-03-03 ENCOUNTER — Other Ambulatory Visit: Payer: Self-pay

## 2020-03-03 ENCOUNTER — Ambulatory Visit (INDEPENDENT_AMBULATORY_CARE_PROVIDER_SITE_OTHER): Payer: 59 | Admitting: Internal Medicine

## 2020-03-03 VITALS — BP 126/69 | HR 65 | Temp 96.7°F | Resp 18 | Ht 72.0 in | Wt 259.2 lb

## 2020-03-03 DIAGNOSIS — E1159 Type 2 diabetes mellitus with other circulatory complications: Secondary | ICD-10-CM | POA: Diagnosis not present

## 2020-03-03 LAB — BASIC METABOLIC PANEL
BUN: 18 mg/dL (ref 6–23)
CO2: 28 mEq/L (ref 19–32)
Calcium: 9.2 mg/dL (ref 8.4–10.5)
Chloride: 106 mEq/L (ref 96–112)
Creatinine, Ser: 1.12 mg/dL (ref 0.40–1.50)
GFR: 68.3 mL/min (ref >60.00)
Glucose, Bld: 176 mg/dL — ABNORMAL HIGH (ref 70–99)
Potassium: 4.7 mEq/L (ref 3.5–5.1)
Sodium: 139 mEq/L (ref 135–145)

## 2020-03-03 LAB — HEMOGLOBIN A1C: Hgb A1c MFr Bld: 8.6 % — ABNORMAL HIGH (ref 4.6–6.5)

## 2020-03-03 NOTE — Patient Instructions (Addendum)
Per our records you are due for an eye exam. Please contact your eye doctor to schedule an appointment. Please have them send copies of your office visit notes to [place]. Our fax number is [phone].   GO TO THE LAB : Get the blood work     [place], [place] back for a checkup in 1 month  We will send you the results via MyChart, with instructions. If you have any questions about instructions either write back or call the office Do not run out of any medications, if there are no refills to your pharmacy please call us

## 2020-03-03 NOTE — Progress Notes (Signed)
Pre visit review using our clinic review tool, if applicable. No additional management support is needed unless otherwise documented below in the visit note. 

## 2020-03-03 NOTE — Progress Notes (Signed)
Subjective:    Patient ID: William Mckenzie, male    DOB: 10-Sep-1966, 54 y.o.   MRN: [id number]  DOS:  03/03/2020 Type of visit - description: Follow-up Today we talk about diabetes. Not taking Rybelsus  Wt Readings from Last 3 Encounters:  03/03/20 259 lb 4 oz (117.6 kg)  10/30/19 252 lb 4 oz (114.4 kg)  08/20/19 253 lb 8.5 oz (115 kg)     Review of Systems Denies chest pain or difficulty breathing No nausea, vomiting, diarrhea  Past Medical History:  Diagnosis Date  . Acute meniscal tear, medial    Left knee  . Anxiety   . CAD (coronary artery disease)    prior inferior MI in 2010 treated with stenting of the right PDA; had nonobstructive disease in the LAD and LCX with approximately 40% lesions. EF normal  . Diabetes mellitus without complication ([place])   . Hyperlipidemia   . Hypertension    white coat   . Myocardial infarction ([place])   . Obesity     Past Surgical History:  Procedure Laterality Date  . CARDIAC CATHETERIZATION    . CORONARY ANGIOPLASTY    . KNEE ARTHROSCOPY WITH MEDIAL MENISECTOMY Left 08/20/2019   Procedure: ARTHROSCOPY KNEE, partial medial menisectomy, chondroplasty;  Surgeon: Dorna Leitz, MD;  Location: [facility];  Service: Orthopedics;  Laterality: Left;  . KNEE SURGERY Left 12/2010   LEFT, meniscus . arthroscopy  . KNEE SURGERY Left 11/2014   Meniscus, medial    Allergies as of 03/03/2020   No Known Allergies     Medication List       Accurate as of March 03, 2020 11:59 PM. If you have any questions, ask your nurse or doctor.        STOP taking these medications   Rybelsus 3 MG Tabs Generic drug: Semaglutide Stopped by: Kathlene November, MD     TAKE these medications   aspirin 81 MG tablet Take 1 tablet (81 mg total) by mouth daily.   atorvastatin 40 MG tablet Commonly known as: LIPITOR Take 1 tablet (40 mg total) by mouth daily at 6 PM.   buPROPion 150 MG 12 hr tablet Commonly known as: WELLBUTRIN SR Take 1  tablet (150 mg total) by mouth 2 (two) times daily.   metFORMIN 1000 MG tablet Commonly known as: GLUCOPHAGE Take 1 tablet (1,000 mg total) by mouth 2 (two) times daily with a meal.   metoprolol succinate 25 MG 24 hr tablet Commonly known as: TOPROL-XL TAKE 1 TABLET BY MOUTH EVERY DAY   nitroGLYCERIN 0.4 MG SL tablet Commonly known as: NITROSTAT Place 1 tablet (0.4 mg total) under the tongue every 5 (five) minutes x 3 doses as needed for chest pain.   ONE TOUCH LANCETS Misc Check once daily.   pioglitazone 30 MG tablet Commonly known as: ACTOS Take 1 tablet (30 mg total) by mouth daily.          Objective:   Physical Exam BP 126/69 (BP Location: Left Arm, Patient Position: Sitting, Cuff Size: Normal)   Pulse 65   Temp (!) 96.7 F (35.9 C) (Temporal)   Resp 18   Ht 6' (1.829 m)   Wt 259 lb 4 oz (117.6 kg)   SpO2 97%   BMI 35.16 kg/m  General:   Well developed, NAD, BMI noted. HEENT:  Normocephalic . Face symmetric, atraumatic Lungs:  CTA B Normal respiratory effort, no intercostal retractions, no accessory muscle use. Heart: RRR,  no murmur.  Lower extremities: no pretibial edema bilaterally  Skin: Not pale. Not jaundice Neurologic:  alert & oriented X3.  Speech normal, gait appropriate for age and unassisted Psych--  Cognition and judgment appear intact.  Cooperative with normal attention span and concentration.  Behavior appropriate. No anxious or depressed appearing.      Assessment     Assessment DM w/ CAD HTN Hyperlipidemia Depression-Anxiety CAD, MI 2010,  Angioplasty; nuclear scan-echo ok 2016.  Myoview 11-20 20: Normal Epididymitis -UTI E Coli (first episode 08/2018)  PLAN: DM: Unfortunately patient is not taking Rybelsus ("it was not renewed"). Currently on Metformin and Actos. Started a better diet about a week ago and plans to start a exercise program today. Strongly recommend to take medications as prescribed, if the medication is not  renew, recommend to call this office and get that fixed. Encouraged to start his exercise program as planned. Plan: BMP, A1c, will send clear instructions with results, likely will start Rybelsus. RTC 1 month to ensure good compliance.   This visit occurred during the SARS-CoV-2 public health emergency.  Safety protocols were in place, including screening questions prior to the visit, additional usage of staff PPE, and extensive cleaning of exam room while observing appropriate contact time as indicated for disinfecting solutions.

## 2020-03-04 ENCOUNTER — Ambulatory Visit: Payer: 59 | Admitting: Internal Medicine

## 2020-03-04 NOTE — Assessment & Plan Note (Signed)
DM: Unfortunately patient is not taking Rybelsus ("it was not renewed"). Currently on Metformin and Actos. Started a better diet about a week ago and plans to start a exercise program today. Strongly recommend to take medications as prescribed, if the medication is not renew, recommend to call this office and get that fixed. Encouraged to start his exercise program as planned. Plan: BMP, A1c, will send clear instructions with results, likely will start Rybelsus. RTC 1 month to ensure good compliance.

## 2020-03-06 MED ORDER — RYBELSUS 3 MG PO TABS: 3.0000 mg | ORAL_TABLET | Freq: Every day | ORAL | 1 refills | Status: DC

## 2020-03-06 NOTE — Addendum Note (Signed)
Addended byDamita Dunnings D on: 03/06/2020 04:14 PM   Modules accepted: Orders

## 2020-04-02 ENCOUNTER — Other Ambulatory Visit: Payer: Self-pay | Admitting: Internal Medicine

## 2020-04-02 ENCOUNTER — Ambulatory Visit: Payer: 59 | Admitting: Internal Medicine

## 2020-04-03 ENCOUNTER — Encounter: Payer: Self-pay | Admitting: Internal Medicine

## 2020-04-19 ENCOUNTER — Other Ambulatory Visit: Payer: Self-pay | Admitting: Internal Medicine

## 2020-04-24 ENCOUNTER — Other Ambulatory Visit: Payer: Self-pay | Admitting: Internal Medicine

## 2020-05-05 ENCOUNTER — Other Ambulatory Visit: Payer: Self-pay | Admitting: Internal Medicine

## 2020-05-15 ENCOUNTER — Other Ambulatory Visit: Payer: Self-pay | Admitting: Internal Medicine

## 2020-06-20 ENCOUNTER — Other Ambulatory Visit: Payer: Self-pay | Admitting: Internal Medicine

## 2020-06-20 MED ORDER — METFORMIN HCL 1000 MG PO TABS: 1000.0000 mg | ORAL_TABLET | Freq: Two times a day (BID) | ORAL | 0 refills | Status: DC

## 2020-07-12 ENCOUNTER — Other Ambulatory Visit: Payer: Self-pay | Admitting: Internal Medicine

## 2020-07-16 ENCOUNTER — Ambulatory Visit (INDEPENDENT_AMBULATORY_CARE_PROVIDER_SITE_OTHER): Payer: 59 | Admitting: Internal Medicine

## 2020-07-16 ENCOUNTER — Encounter: Payer: Self-pay | Admitting: Internal Medicine

## 2020-07-16 ENCOUNTER — Other Ambulatory Visit: Payer: Self-pay

## 2020-07-16 ENCOUNTER — Telehealth: Payer: Self-pay | Admitting: Internal Medicine

## 2020-07-16 VITALS — BP 117/75 | HR 78 | Temp 98.7°F | Resp 20 | Ht 72.0 in | Wt 256.4 lb

## 2020-07-16 DIAGNOSIS — E1159 Type 2 diabetes mellitus with other circulatory complications: Secondary | ICD-10-CM | POA: Diagnosis not present

## 2020-07-16 DIAGNOSIS — Z23 Encounter for immunization: Secondary | ICD-10-CM

## 2020-07-16 DIAGNOSIS — M720 Palmar fascial fibromatosis [Dupuytren]: Secondary | ICD-10-CM

## 2020-07-16 MED ORDER — METFORMIN HCL 1000 MG PO TABS: 1000.0000 mg | ORAL_TABLET | Freq: Two times a day (BID) | ORAL | 3 refills | Status: DC

## 2020-07-16 MED ORDER — RYBELSUS 3 MG PO TABS: 3.0000 mg | ORAL_TABLET | Freq: Every day | ORAL | 3 refills | Status: DC

## 2020-07-16 NOTE — Telephone Encounter (Signed)
error 

## 2020-07-16 NOTE — Progress Notes (Addendum)
Subjective:    Patient ID: William Mckenzie, male    DOB: 1966/06/16, 54 y.o.   MRN: [id number]  DOS:  07/16/2020 Type of visit - description: Routine checkup DM: Was recommended but did not start Rybelsus Few months ago noted lump at the right hand, denies any pain or discomfort. Ambulatory BPs normal, no recent ambulatory CBGs    Review of Systems See above   Past Medical History:  Diagnosis Date  . Acute meniscal tear, medial    Left knee  . Anxiety   . CAD (coronary artery disease)    prior inferior MI in 2010 treated with stenting of the right PDA; had nonobstructive disease in the LAD and LCX with approximately 40% lesions. EF normal  . Diabetes mellitus without complication ([place])   . Hyperlipidemia   . Hypertension    white coat   . Myocardial infarction ([place])   . Obesity     Past Surgical History:  Procedure Laterality Date  . CARDIAC CATHETERIZATION    . CORONARY ANGIOPLASTY    . KNEE ARTHROSCOPY WITH MEDIAL MENISECTOMY Left 08/20/2019   Procedure: ARTHROSCOPY KNEE, partial medial menisectomy, chondroplasty;  Surgeon: Dorna Leitz, MD;  Location: [facility];  Service: Orthopedics;  Laterality: Left;  . KNEE SURGERY Left 12/2010   LEFT, meniscus . arthroscopy  . KNEE SURGERY Left 11/2014   Meniscus, medial    Allergies as of 07/16/2020   No Known Allergies     Medication List       Accurate as of July 16, 2020 11:59 PM. If you have any questions, ask your nurse or doctor.        aspirin 81 MG tablet Take 1 tablet (81 mg total) by mouth daily.   atorvastatin 40 MG tablet Commonly known as: LIPITOR Take 1 tablet (40 mg total) by mouth daily at 6 PM.   buPROPion 150 MG 12 hr tablet Commonly known as: WELLBUTRIN SR TAKE 1 TABLET BY MOUTH TWICE A DAY   metFORMIN 1000 MG tablet Commonly known as: GLUCOPHAGE Take 1 tablet (1,000 mg total) by mouth 2 (two) times daily with a meal. What changed: See the new  instructions. Changed by: Kathlene November, MD   metoprolol succinate 25 MG 24 hr tablet Commonly known as: TOPROL-XL TAKE 1 TABLET BY MOUTH EVERY DAY   nitroGLYCERIN 0.4 MG SL tablet Commonly known as: NITROSTAT Place 1 tablet (0.4 mg total) under the tongue every 5 (five) minutes x 3 doses as needed for chest pain.   ONE TOUCH LANCETS Misc Check once daily.   pioglitazone 30 MG tablet Commonly known as: ACTOS TAKE 1 TABLET BY MOUTH EVERY DAY   Rybelsus 3 MG Tabs Generic drug: Semaglutide Take 3 mg by mouth daily.          Objective:   Physical Exam Musculoskeletal:       Arms:    BP 117/75 (BP Location: Right Arm, Patient Position: Sitting)   Pulse 78   Temp 98.7 F (37.1 C) (Oral)   Resp 20   Ht 6' (1.829 m)   Wt 256 lb 6.4 oz (116.3 kg)   SpO2 96%   BMI 34.77 kg/m  General:   Well developed, NAD, BMI noted. HEENT:  Normocephalic . Face symmetric, atraumatic Lungs:  CTA B Normal respiratory effort, no intercostal retractions, no accessory muscle use. Heart: RRR,  no murmur.  Lower extremities: no pretibial edema bilaterally  Skin: Not pale. Not jaundice Neurologic:  alert & oriented  X3.  Speech normal, gait appropriate for age and unassisted Psych--  Cognition and judgment appear intact.  Cooperative with normal attention span and concentration.  Behavior appropriate. No anxious or depressed appearing.      Assessment     Assessment DM w/ CAD HTN Hyperlipidemia Depression-Anxiety CAD, MI 2010,  Angioplasty; nuclear scan-echo ok 2016.  Myoview 11-20 20: Normal Epididymitis -UTI E Coli (first episode 08/2018)  PLAN: DM : A1cJune 2021 was 8.6, was recommended to restart Rybelsus and a prescription was sent.  He never did, states that the prescription was not in his pharmacy and he did not call the office as recommended. Plan: Continue Metformin, Actos, printed prescription for Rybelsus. Reports he is not doing well with diet and exercise,  encouraged to do so.  RTC 3 months.  Again risk of uncontrolled DM including heart attack discussed. Dupuytren's contracture: See HPI, clinical DX  is Dupuytren's, offered referral, declined , we agreed on observation. Preventive care: Had a [employer], vaccine April 2021, recommend to check CDC recommendations about booster. Flu shot today. Social: He has moved near [place] but likes to continue getting his medical care here, he still have a property in family in [place] RTC 69-month   This visit occurred during the SARS-CoV-2 public health emergency.  Safety protocols were in place, including screening questions prior to the visit, additional usage of staff PPE, and extensive cleaning of exam room while observing appropriate contact time as indicated for disinfecting solutions.

## 2020-07-16 NOTE — Patient Instructions (Addendum)
If you do not get your diabetes under better control your risk to have a heart attack is very high.  Start Rybelsus, continue the other medicines.  The condition you have on the right is  Dupuytren's contracture  Please pay attention to the CDC news about getting Covid booster   [place], [place] back for a office visit in 2 months

## 2020-07-18 DIAGNOSIS — M720 Palmar fascial fibromatosis [Dupuytren]: Secondary | ICD-10-CM | POA: Insufficient documentation

## 2020-07-18 NOTE — Assessment & Plan Note (Signed)
DM : A1cJune 2021 was 8.6, was recommended to restart Rybelsus and a prescription was sent.  He never did, states that the prescription was not in his pharmacy and he did not call the office as recommended. Plan: Continue Metformin, Actos, printed prescription for Rybelsus. Reports he is not doing well with diet and exercise, encouraged to do so.  RTC 3 months.  Again risk of uncontrolled DM including heart attack discussed. Dupuytren's contracture: See HPI, clinical DX  is Dupuytren's, offered referral, declined , we agreed on observation. Preventive care: Had a [employer], vaccine April 2021, recommend to check CDC recommendations about booster. Flu shot today. Social: He has moved near [place] but likes to continue getting his medical care here, he still have a property in family in [place] RTC 24-month

## 2020-08-08 ENCOUNTER — Ambulatory Visit (INDEPENDENT_AMBULATORY_CARE_PROVIDER_SITE_OTHER): Payer: 59 | Admitting: Family Medicine

## 2020-08-08 ENCOUNTER — Ambulatory Visit (HOSPITAL_BASED_OUTPATIENT_CLINIC_OR_DEPARTMENT_OTHER)
Admission: RE | Admit: 2020-08-08 | Discharge: 2020-08-08 | Disposition: A | Discharge status: Home / Self Care | Payer: 59 | Source: Ambulatory Visit | Attending: Family Medicine | Admitting: Family Medicine

## 2020-08-08 ENCOUNTER — Encounter: Payer: Self-pay | Admitting: Family Medicine

## 2020-08-08 ENCOUNTER — Other Ambulatory Visit: Payer: Self-pay

## 2020-08-08 VITALS — BP 124/80 | HR 80 | Temp 98.1°F | Resp 18 | Ht 72.0 in | Wt 261.4 lb

## 2020-08-08 DIAGNOSIS — E1165 Type 2 diabetes mellitus with hyperglycemia: Secondary | ICD-10-CM | POA: Diagnosis not present

## 2020-08-08 DIAGNOSIS — M79671 Pain in right foot: Secondary | ICD-10-CM

## 2020-08-08 NOTE — Patient Instructions (Signed)
Ankle Pain The ankle joint holds your body weight and allows you to move around. Ankle pain can occur on either side or the back of one ankle or both ankles. Ankle pain may be sharp and burning or dull and aching. There may be tenderness, stiffness, redness, or warmth around the ankle. Many things can cause ankle pain, including an injury to the area and overuse of the ankle. Follow these instructions at home: Activity  Rest your ankle as told by your health care provider. Avoid any activities that cause ankle pain.  Do not use the injured limb to support your body weight until your health care provider says that you can. Use crutches as told by your health care provider.  Do exercises as told by your health care provider.  Ask your health care provider when it is safe to drive if you have a brace on your ankle. If you have a brace:  Wear the brace as told by your health care provider. Remove it only as told by your health care provider.  Loosen the brace if your toes tingle, become numb, or turn cold and blue.  Keep the brace clean.  If the brace is not waterproof: ? Do not let it get wet. ? Cover it with a watertight covering when you take a bath or shower. If you were given an elastic bandage:   Remove it when you take a bath or a shower.  Try not to move your ankle very much, but wiggle your toes from time to time. This helps to prevent swelling.  Adjust the bandage to make it more comfortable if it feels too tight.  Loosen the bandage if you have numbness or tingling in your foot or if your foot turns cold and blue. Managing pain, stiffness, and swelling   If directed, put ice on the painful area. ? If you have a removable brace or elastic bandage, remove it as told by your health care provider. ? Put ice in a plastic bag. ? Place a towel between your skin and the bag. ? Leave the ice on for 20 minutes, 2-3 times a day.  Move your toes often to avoid stiffness and to  lessen swelling.  Raise (elevate) your ankle above the level of your heart while you are sitting or lying down. General instructions  Record information about your pain. Writing down the following may be helpful for you and your health care provider: ? How often you have ankle pain. ? Where the pain is located. ? What the pain feels like.  If treatment involves wearing a prescribed shoe or insole, make sure you wear it correctly and for as long as told by your health care provider.  Take over-the-counter and prescription medicines only as told by your health care provider.  Keep all follow-up visits as told by your health care provider. This is important. Contact a health care provider if:  Your pain gets worse.  Your pain is not relieved with medicines.  You have a fever or chills.  You are having more trouble with walking.  You have new symptoms. Get help right away if:  Your foot, leg, toes, or ankle: ? Tingles or becomes numb. ? Becomes swollen. ? Turns pale or blue. Summary  Ankle pain can occur on either side or the back of one ankle or both ankles.  Ankle pain may be sharp and burning or dull and aching.  Rest your ankle as told by your health care provider.   If told, apply ice to the area.  Take over-the-counter and prescription medicines only as told by your health care provider. This information is not intended to replace advice given to you by your health care provider. Make sure you discuss any questions you have with your health care provider. Document Revised: 01/02/2019 Document Reviewed: 03/22/2018 Elsevier Patient Education  2020 Elsevier Inc.  

## 2020-08-08 NOTE — Progress Notes (Signed)
Patient ID: William Mckenzie, male    DOB: 07-Feb-1966  Age: 54 y.o. MRN: [id number]    Subjective:  Subjective  HPI William Mckenzie presents for R foot/ ankle pain since Thursday am.  It hurts with weight bearing.  No known injury  No redness or rash, no wounds.  He is concerned about his dm and a family member was concerned about a clot.  No other complaints   Review of Systems  Constitutional: Negative for appetite change, diaphoresis, fatigue and unexpected weight change.  Eyes: Negative for pain, redness and visual disturbance.  Respiratory: Negative for cough, chest tightness, shortness of breath and wheezing.   Cardiovascular: Negative for chest pain, palpitations and leg swelling.  Endocrine: Negative for cold intolerance, heat intolerance, polydipsia, polyphagia and polyuria.  Genitourinary: Negative for difficulty urinating, dysuria and frequency.  Musculoskeletal: Positive for gait problem and joint swelling.  Neurological: Negative for dizziness, light-headedness, numbness and headaches.    History Past Medical History:  Diagnosis Date  . Acute meniscal tear, medial    Left knee  . Anxiety   . CAD (coronary artery disease)    prior inferior MI in 2010 treated with stenting of the right PDA; had nonobstructive disease in the LAD and LCX with approximately 40% lesions. EF normal  . Diabetes mellitus without complication ([place])   . Hyperlipidemia   . Hypertension    white coat   . Myocardial infarction ([place])   . Obesity     He has a past surgical history that includes Knee surgery (Left, 12/2010); Cardiac catheterization; Coronary angioplasty; Knee surgery (Left, 11/2014); and Knee arthroscopy with medial menisectomy (Left, 08/20/2019).   His family history includes CAD in an other family member; Heart disease in his father and paternal grandfather; Hypertension in his mother.He reports that he has never smoked. He quit smokeless tobacco use about 7 years  ago.  His smokeless tobacco use included chew. He reports current alcohol use. He reports that he does not use drugs.  Current Outpatient Medications on File Prior to Visit  Medication Sig Dispense Refill  . aspirin 81 MG tablet Take 1 tablet (81 mg total) by mouth daily.    Marland Kitchen atorvastatin (LIPITOR) 40 MG tablet Take 1 tablet (40 mg total) by mouth daily at 6 PM. 90 tablet 3  . buPROPion (WELLBUTRIN SR) 150 MG 12 hr tablet TAKE 1 TABLET BY MOUTH TWICE A DAY 180 tablet 1  . metFORMIN (GLUCOPHAGE) 1000 MG tablet Take 1 tablet (1,000 mg total) by mouth 2 (two) times daily with a meal. 60 tablet 3  . metoprolol succinate (TOPROL-XL) 25 MG 24 hr tablet TAKE 1 TABLET BY MOUTH EVERY DAY 90 tablet 3  . nitroGLYCERIN (NITROSTAT) 0.4 MG SL tablet Place 1 tablet (0.4 mg total) under the tongue every 5 (five) minutes x 3 doses as needed for chest pain. 25 tablet 3  . ONE TOUCH LANCETS MISC Check once daily. 200 each 3  . pioglitazone (ACTOS) 30 MG tablet TAKE 1 TABLET BY MOUTH EVERY DAY 90 tablet 1  . Semaglutide (RYBELSUS) 3 MG TABS Take 3 mg by mouth daily. 30 tablet 3   No current facility-administered medications on file prior to visit.     Objective:  Objective  Physical Exam Vitals and nursing note reviewed.  Constitutional:      General: He is sleeping.     Appearance: He is well-developed.  HENT:     Head: Normocephalic and atraumatic.  Eyes:  Pupils: Pupils are equal, round, and reactive to light.  Neck:     Thyroid: No thyromegaly.  Cardiovascular:     Rate and Rhythm: Normal rate and regular rhythm.     Heart sounds: No murmur heard.   Pulmonary:     Effort: Pulmonary effort is normal. No respiratory distress.     Breath sounds: Normal breath sounds. No wheezing or rales.  Chest:     Chest wall: No tenderness.  Musculoskeletal:        General: Tenderness present. No signs of injury.     Cervical back: Normal range of motion and neck supple.     Right ankle: Swelling  present. Tenderness present over the lateral malleolus. No medial malleolus tenderness. Decreased range of motion.     Left ankle: Normal.  Skin:    General: Skin is warm and dry.  Neurological:     Mental Status: He is oriented to person, place, and time.  Psychiatric:        Behavior: Behavior normal.        Thought Content: Thought content normal.        Judgment: Judgment normal.    BP 124/80 (BP Location: Right Arm, Patient Position: Sitting, Cuff Size: Normal)   Pulse 80   Temp 98.1 F (36.7 C) (Oral)   Resp 18   Ht 6' (1.829 m)   Wt 261 lb 6.4 oz (118.6 kg)   SpO2 96%   BMI 35.45 kg/m  Wt Readings from Last 3 Encounters:  08/08/20 261 lb 6.4 oz (118.6 kg)  07/16/20 256 lb 6.4 oz (116.3 kg)  03/03/20 259 lb 4 oz (117.6 kg)     Lab Results  Component Value Date   WBC 5.5 10/30/2019   HGB 14.5 10/30/2019   HCT 43.5 10/30/2019   PLT 211.0 10/30/2019   GLUCOSE 226 (H) 08/08/2020   CHOL 136 08/08/2020   TRIG 128 08/08/2020   HDL 53 08/08/2020   LDLDIRECT 179.4 11/21/2007   LDLCALC 62 08/08/2020   ALT 27 08/08/2020   AST 17 08/08/2020   NA 140 08/08/2020   K 4.8 08/08/2020   CL 103 08/08/2020   CREATININE 1.19 08/08/2020   BUN 16 08/08/2020   CO2 25 08/08/2020   TSH 1.48 06/20/2019   PSA 0.13 10/30/2019   INR 0.98 11/05/2014   HGBA1C 9.6 (H) 08/08/2020   MICROALBUR 1.1 06/20/2019    No results found.   Assessment & Plan:  Plan  I am having William Mckenzie. William Mckenzie maintain his ONE TOUCH LANCETS, aspirin, metoprolol succinate, atorvastatin, nitroGLYCERIN, buPROPion, pioglitazone, Rybelsus, and metFORMIN.  No orders of the defined types were placed in this encounter.   Problem List Items Addressed This Visit    None    Visit Diagnoses    Right foot pain    -  Primary   Relevant Orders   DG Foot Complete Right (Completed)   Comprehensive metabolic panel (Completed)   Hemoglobin A1c (Completed)   Lipid panel (Completed)   Uric acid (Completed)     Uncontrolled type 2 diabetes mellitus with hyperglycemia (HCC)       Relevant Orders   Comprehensive metabolic panel (Completed)   Hemoglobin A1c (Completed)   Lipid panel (Completed)      Follow-up: No follow-ups on file.  Ann Held, DO

## 2020-08-09 DIAGNOSIS — E1165 Type 2 diabetes mellitus with hyperglycemia: Secondary | ICD-10-CM | POA: Insufficient documentation

## 2020-08-09 DIAGNOSIS — M79671 Pain in right foot: Secondary | ICD-10-CM | POA: Insufficient documentation

## 2020-08-09 LAB — COMPREHENSIVE METABOLIC PANEL
AG Ratio: 2 (calc) (ref 1.0–2.5)
ALT: 27 U/L (ref 9–46)
AST: 17 U/L (ref 10–35)
Albumin: 4.6 g/dL (ref 3.6–5.1)
Alkaline phosphatase (APISO): 71 U/L (ref 35–144)
BUN: 16 mg/dL (ref 7–25)
CO2: 25 mmol/L (ref 20–32)
Calcium: 9.4 mg/dL (ref 8.6–10.3)
Chloride: 103 mmol/L (ref 98–110)
Creat: 1.19 mg/dL (ref 0.70–1.33)
Globulin: 2.3 g/dL (calc) (ref 1.9–3.7)
Glucose, Bld: 226 mg/dL — ABNORMAL HIGH (ref 65–99)
Potassium: 4.8 mmol/L (ref 3.5–5.3)
Sodium: 140 mmol/L (ref 135–146)
Total Bilirubin: 0.6 mg/dL (ref 0.2–1.2)
Total Protein: 6.9 g/dL (ref 6.1–8.1)

## 2020-08-09 LAB — LIPID PANEL
Cholesterol: 136 mg/dL (ref <200)
HDL: 53 mg/dL (ref >=40)
LDL Cholesterol (Calc): 62 mg/dL (calc)
Non-HDL Cholesterol (Calc): 83 mg/dL (calc) (ref <130)
Total CHOL/HDL Ratio: 2.6 (calc) (ref <5.0)
Triglycerides: 128 mg/dL (ref <150)

## 2020-08-09 LAB — HEMOGLOBIN A1C
Hgb A1c MFr Bld: 9.6 % of total Hgb — ABNORMAL HIGH (ref <5.7)
Mean Plasma Glucose: 229 (calc)
eAG (mmol/L): 12.7 (calc)

## 2020-08-09 LAB — URIC ACID: Uric Acid, Serum: 5.2 mg/dL (ref 4.0–8.0)

## 2020-08-09 NOTE — Assessment & Plan Note (Signed)
>>  ASSESSMENT AND PLAN FOR UNCONTROLLED TYPE 2 DIABETES MELLITUS WITH HYPERGLYCEMIA (HCC) WRITTEN ON 08/09/2020  4:41 PM BY LOWNE CHASE, [name], DO  Pt admits to not following good diet  Check labs and f/u pcp

## 2020-08-09 NOTE — Assessment & Plan Note (Signed)
Pt admits to not following good diet  Check labs and f/u pcp

## 2020-08-09 NOTE — Assessment & Plan Note (Addendum)
?   Gout-- will check xray due to pain with weight bearing  Antiinflammatory, ice, rest  Check labs  rto prm

## 2020-08-11 ENCOUNTER — Other Ambulatory Visit: Payer: Self-pay

## 2020-08-11 MED ORDER — SEMAGLUTIDE 7 MG PO TABS: 1.0000 | ORAL_TABLET | Freq: Every day | ORAL | 1 refills | Status: DC

## 2020-08-19 ENCOUNTER — Other Ambulatory Visit: Payer: Self-pay

## 2020-08-19 ENCOUNTER — Other Ambulatory Visit: Payer: Self-pay | Admitting: Internal Medicine

## 2020-08-19 DIAGNOSIS — I10 Essential (primary) hypertension: Secondary | ICD-10-CM

## 2020-08-19 DIAGNOSIS — I2510 Atherosclerotic heart disease of native coronary artery without angina pectoris: Secondary | ICD-10-CM

## 2020-08-19 DIAGNOSIS — E785 Hyperlipidemia, unspecified: Secondary | ICD-10-CM

## 2020-08-19 MED ORDER — METOPROLOL SUCCINATE ER 25 MG PO TB24: 25.0000 mg | ORAL_TABLET | Freq: Every day | ORAL | 0 refills | Status: DC

## 2020-08-27 ENCOUNTER — Other Ambulatory Visit: Payer: Self-pay | Admitting: Cardiovascular Disease

## 2020-08-27 DIAGNOSIS — I2510 Atherosclerotic heart disease of native coronary artery without angina pectoris: Secondary | ICD-10-CM

## 2020-08-27 DIAGNOSIS — I10 Essential (primary) hypertension: Secondary | ICD-10-CM

## 2020-08-27 DIAGNOSIS — E785 Hyperlipidemia, unspecified: Secondary | ICD-10-CM

## 2020-09-10 ENCOUNTER — Other Ambulatory Visit: Payer: Self-pay | Admitting: Cardiovascular Disease

## 2020-09-10 DIAGNOSIS — I10 Essential (primary) hypertension: Secondary | ICD-10-CM

## 2020-09-10 DIAGNOSIS — I2510 Atherosclerotic heart disease of native coronary artery without angina pectoris: Secondary | ICD-10-CM

## 2020-09-10 DIAGNOSIS — E785 Hyperlipidemia, unspecified: Secondary | ICD-10-CM

## 2020-10-15 ENCOUNTER — Other Ambulatory Visit: Payer: Self-pay | Admitting: Family Medicine

## 2020-10-16 ENCOUNTER — Encounter: Payer: Self-pay | Admitting: Internal Medicine

## 2020-10-16 ENCOUNTER — Other Ambulatory Visit: Payer: Self-pay

## 2020-10-16 ENCOUNTER — Ambulatory Visit (INDEPENDENT_AMBULATORY_CARE_PROVIDER_SITE_OTHER): Payer: 59 | Admitting: Internal Medicine

## 2020-10-16 VITALS — BP 109/75 | HR 77 | Temp 98.5°F | Resp 18 | Ht 72.0 in | Wt 256.5 lb

## 2020-10-16 DIAGNOSIS — I2510 Atherosclerotic heart disease of native coronary artery without angina pectoris: Secondary | ICD-10-CM

## 2020-10-16 DIAGNOSIS — Z9861 Coronary angioplasty status: Secondary | ICD-10-CM

## 2020-10-16 DIAGNOSIS — E1165 Type 2 diabetes mellitus with hyperglycemia: Secondary | ICD-10-CM | POA: Diagnosis not present

## 2020-10-16 DIAGNOSIS — Z1159 Encounter for screening for other viral diseases: Secondary | ICD-10-CM | POA: Diagnosis not present

## 2020-10-16 LAB — CBC WITH DIFFERENTIAL/PLATELET
Basophils Absolute: 0 10*3/uL (ref 0.0–0.1)
Basophils Relative: 0.3 % (ref 0.0–3.0)
Eosinophils Absolute: 0.1 10*3/uL (ref 0.0–0.7)
Eosinophils Relative: 1.1 % (ref 0.0–5.0)
HCT: 44.8 % (ref 39.0–52.0)
Hemoglobin: 14.7 g/dL (ref 13.0–17.0)
Lymphocytes Relative: 33.5 % (ref 12.0–46.0)
Lymphs Abs: 1.8 10*3/uL (ref 0.7–4.0)
MCHC: 32.8 g/dL (ref 30.0–36.0)
MCV: 93.2 fl (ref 78.0–100.0)
Monocytes Absolute: 0.4 10*3/uL (ref 0.1–1.0)
Monocytes Relative: 7.7 % (ref 3.0–12.0)
Neutro Abs: 3.2 10*3/uL (ref 1.4–7.7)
Neutrophils Relative %: 57.4 % (ref 43.0–77.0)
Platelets: 217 10*3/uL (ref 150.0–400.0)
RBC: 4.8 Mil/uL (ref 4.22–5.81)
RDW: 13.2 % (ref 11.5–15.5)
WBC: 5.5 10*3/uL (ref 4.0–10.5)

## 2020-10-16 LAB — MICROALBUMIN / CREATININE URINE RATIO
Creatinine,U: 244.3 mg/dL
Microalb Creat Ratio: 0.8 mg/g (ref 0.0–30.0)
Microalb, Ur: 1.9 mg/dL (ref 0.0–1.9)

## 2020-10-16 LAB — HEMOGLOBIN A1C: Hgb A1c MFr Bld: 10.5 % — ABNORMAL HIGH (ref 4.6–6.5)

## 2020-10-16 NOTE — Assessment & Plan Note (Signed)
DM: Last A1c was elevated at 9.6.  Since then he is doing better with diet, trying to eat less carbohydrates, good compliance with metformin, Actos, Rybelsus.  Ambulatory CBGs around 120.  Feet exam negative.  Plan: Check A1c. CAD: Asymptomatic, check a CBC.  Again reiterated importance to have good diabetes control to prevent further heart issues. High cholesterol: Well-controlled on Lipitor HTN: BP is very good, continue metoprolol Preventive care: Check hep C serology RTC 3 months

## 2020-10-16 NOTE — Patient Instructions (Addendum)
Per our records you are due for an eye exam. Please contact your eye doctor to schedule an appointm ent. Please have them send copies of your office visit notes to [place]. Our fax number is [phone].   GO TO THE LAB : Get the blood work     [place], [place] Come back for a checkup in 3 months    DIABETES self learn tools:  Consider getting one of the following books: "Program for Reversing Diabetes" (solid advise but may not include the latest medicines available) "Diabetes for Dummies" "The [facility] Diabetes diet"    "The Essential Diabetes Book"  Online resources:  The American diabetes Association     diabetes.[email or web address] website it is a Microbiologist  [place].org  The [facility] web site has a diabetes section  [employer]

## 2020-10-16 NOTE — Progress Notes (Signed)
Pre visit review using our clinic review tool, if applicable. No additional management support is needed unless otherwise documented below in the visit note. 

## 2020-10-16 NOTE — Progress Notes (Signed)
Subjective:    Patient ID: William Mckenzie, male    DOB: 01-Feb-1966, 55 y.o.   MRN: [id number]  DOS:  10/16/2020 Type of visit - description: Follow-up Since the last office visit reports good medication compliance. Diet is improving.   Wt Readings from Last 3 Encounters:  10/16/20 256 lb 8 oz (116.3 kg)  08/08/20 261 lb 6.4 oz (118.6 kg)  07/16/20 256 lb 6.4 oz (116.3 kg)     Review of Systems Denies chest pain no difficulty breathing. No lower extremity paresthesias.   Past Medical History:  Diagnosis Date  . Acute meniscal tear, medial    Left knee  . Anxiety   . CAD (coronary artery disease)    prior inferior MI in 2010 treated with stenting of the right PDA; had nonobstructive disease in the LAD and LCX with approximately 40% lesions. EF normal  . Diabetes mellitus without complication ([place])   . Hyperlipidemia   . Hypertension    white coat   . Myocardial infarction ([place])   . Obesity     Past Surgical History:  Procedure Laterality Date  . CARDIAC CATHETERIZATION    . CORONARY ANGIOPLASTY    . KNEE ARTHROSCOPY WITH MEDIAL MENISECTOMY Left 08/20/2019   Procedure: ARTHROSCOPY KNEE, partial medial menisectomy, chondroplasty;  Surgeon: Dorna Leitz, MD;  Location: [facility];  Service: Orthopedics;  Laterality: Left;  . KNEE SURGERY Left 12/2010   LEFT, meniscus . arthroscopy  . KNEE SURGERY Left 11/2014   Meniscus, medial    Allergies as of 10/16/2020   No Known Allergies     Medication List       Accurate as of October 16, 2020  5:33 PM. If you have any questions, ask your nurse or doctor.        aspirin 81 MG tablet Take 1 tablet (81 mg total) by mouth daily.   atorvastatin 40 MG tablet Commonly known as: LIPITOR TAKE 1 TABLET (40 MG TOTAL) BY MOUTH DAILY AT 6 PM.   buPROPion 150 MG 12 hr tablet Commonly known as: WELLBUTRIN SR TAKE 1 TABLET BY MOUTH TWICE A DAY   metFORMIN 1000 MG tablet Commonly known as:  GLUCOPHAGE Take 1 tablet (1,000 mg total) by mouth 2 (two) times daily with a meal.   metoprolol succinate 25 MG 24 hr tablet Commonly known as: TOPROL-XL Take 1 tablet (25 mg total) by mouth daily.   nitroGLYCERIN 0.4 MG SL tablet Commonly known as: NITROSTAT Place 1 tablet (0.4 mg total) under the tongue every 5 (five) minutes x 3 doses as needed for chest pain.   ONE TOUCH LANCETS Misc Check once daily.   pioglitazone 30 MG tablet Commonly known as: ACTOS TAKE 1 TABLET BY MOUTH EVERY DAY   Rybelsus 7 MG Tabs Generic drug: Semaglutide Take 1 tablet by mouth daily.          Objective:   Physical Exam BP 109/75 (BP Location: Left Arm, Patient Position: Sitting, Cuff Size: Normal)   Pulse 77   Temp 98.5 F (36.9 C) (Oral)   Resp 18   Ht 6' (1.829 m)   Wt 256 lb 8 oz (116.3 kg)   SpO2 94%   BMI 34.79 kg/m  General:   Well developed, NAD, BMI noted. HEENT:  Normocephalic . Face symmetric, atraumatic DM foot exam: No edema, good pedal pulses, pinprick examination normal Skin: Not pale. Not jaundice Neurologic:  alert & oriented X3.  Speech normal, gait appropriate for age and  unassisted Psych--  Cognition and judgment appear intact.  Cooperative with normal attention span and concentration.  Behavior appropriate. No anxious or depressed appearing.      Assessment       Assessment DM w/ CAD HTN Hyperlipidemia Depression-Anxiety CAD, MI 2010,  Angioplasty; nuclear scan-echo ok 2016.  Myoview 11-20 20: Normal Epididymitis -UTI E Coli (first episode 08/2018)  PLAN: DM: Last A1c was elevated at 9.6.  Since then he is doing better with diet, trying to eat less carbohydrates, good compliance with metformin, Actos, Rybelsus.  Ambulatory CBGs around 120.  Feet exam negative.  Plan: Check A1c. CAD: Asymptomatic, check a CBC.  Again reiterated importance to have good diabetes control to prevent further heart issues. High cholesterol: Well-controlled on  Lipitor HTN: BP is very good, continue metoprolol Preventive care: Check hep C serology RTC 3 months    This visit occurred during the SARS-CoV-2 public health emergency.  Safety protocols were in place, including screening questions prior to the visit, additional usage of staff PPE, and extensive cleaning of exam room while observing appropriate contact time as indicated for disinfecting solutions.

## 2020-10-17 LAB — HEPATITIS C ANTIBODY
Hepatitis C Ab: NONREACTIVE
SIGNAL TO CUT-OFF: 0 (ref <1.00)

## 2020-10-21 NOTE — Addendum Note (Signed)
Addended byDamita Dunnings D on: 10/21/2020 08:04 AM   Modules accepted: Orders

## 2020-11-01 ENCOUNTER — Other Ambulatory Visit: Payer: Self-pay | Admitting: Internal Medicine

## 2020-11-03 ENCOUNTER — Other Ambulatory Visit: Payer: Self-pay | Admitting: Internal Medicine

## 2020-11-09 ENCOUNTER — Other Ambulatory Visit: Payer: Self-pay | Admitting: Internal Medicine

## 2020-11-11 ENCOUNTER — Other Ambulatory Visit: Payer: Self-pay | Admitting: Internal Medicine

## 2020-12-01 ENCOUNTER — Other Ambulatory Visit: Payer: Self-pay | Admitting: Cardiology

## 2020-12-01 DIAGNOSIS — E785 Hyperlipidemia, unspecified: Secondary | ICD-10-CM

## 2020-12-01 DIAGNOSIS — I10 Essential (primary) hypertension: Secondary | ICD-10-CM

## 2020-12-01 DIAGNOSIS — I2510 Atherosclerotic heart disease of native coronary artery without angina pectoris: Secondary | ICD-10-CM

## 2020-12-03 ENCOUNTER — Encounter: Payer: Self-pay | Admitting: Internal Medicine

## 2020-12-03 MED ORDER — METFORMIN HCL 1000 MG PO TABS: 1000.0000 mg | ORAL_TABLET | Freq: Two times a day (BID) | ORAL | 0 refills | Status: DC

## 2020-12-16 ENCOUNTER — Other Ambulatory Visit: Payer: Self-pay | Admitting: Cardiovascular Disease

## 2020-12-16 DIAGNOSIS — I10 Essential (primary) hypertension: Secondary | ICD-10-CM

## 2020-12-16 DIAGNOSIS — E785 Hyperlipidemia, unspecified: Secondary | ICD-10-CM

## 2020-12-16 DIAGNOSIS — I2510 Atherosclerotic heart disease of native coronary artery without angina pectoris: Secondary | ICD-10-CM

## 2020-12-31 ENCOUNTER — Other Ambulatory Visit: Payer: Self-pay | Admitting: Cardiovascular Disease

## 2020-12-31 DIAGNOSIS — E785 Hyperlipidemia, unspecified: Secondary | ICD-10-CM

## 2020-12-31 DIAGNOSIS — I2510 Atherosclerotic heart disease of native coronary artery without angina pectoris: Secondary | ICD-10-CM

## 2020-12-31 DIAGNOSIS — I10 Essential (primary) hypertension: Secondary | ICD-10-CM

## 2021-01-15 ENCOUNTER — Ambulatory Visit: Payer: 59 | Admitting: Internal Medicine

## 2021-01-29 LAB — HM DIABETES FOOT EXAM: HM Diabetic Foot Exam: NORMAL

## 2021-02-05 ENCOUNTER — Other Ambulatory Visit: Payer: Self-pay | Admitting: Internal Medicine

## 2021-02-14 ENCOUNTER — Other Ambulatory Visit: Payer: Self-pay | Admitting: Internal Medicine

## 2021-02-15 ENCOUNTER — Encounter: Payer: Self-pay | Admitting: Internal Medicine

## 2021-02-17 ENCOUNTER — Telehealth: Payer: Self-pay

## 2021-02-17 NOTE — Telephone Encounter (Signed)
Per Pt Message, and per Dr. Larose Kells apt was canceled for 02/20/2021. pt will call to schedule in 2-3 months. -JMA

## 2021-02-20 ENCOUNTER — Ambulatory Visit: Payer: 59 | Admitting: Internal Medicine

## 2021-03-24 ENCOUNTER — Other Ambulatory Visit: Payer: Self-pay | Admitting: Cardiovascular Disease

## 2021-03-24 DIAGNOSIS — E785 Hyperlipidemia, unspecified: Secondary | ICD-10-CM

## 2021-03-24 DIAGNOSIS — I2510 Atherosclerotic heart disease of native coronary artery without angina pectoris: Secondary | ICD-10-CM

## 2021-03-24 DIAGNOSIS — I10 Essential (primary) hypertension: Secondary | ICD-10-CM

## 2021-08-24 ENCOUNTER — Other Ambulatory Visit: Payer: Self-pay | Admitting: Cardiovascular Disease

## 2021-08-30 ENCOUNTER — Other Ambulatory Visit: Payer: Self-pay | Admitting: Internal Medicine

## 2021-09-02 ENCOUNTER — Other Ambulatory Visit: Payer: Self-pay | Admitting: Cardiovascular Disease

## 2021-09-17 ENCOUNTER — Other Ambulatory Visit: Payer: Self-pay | Admitting: Cardiovascular Disease

## 2021-09-27 ENCOUNTER — Other Ambulatory Visit: Payer: Self-pay | Admitting: Internal Medicine

## 2021-10-16 ENCOUNTER — Other Ambulatory Visit: Payer: Self-pay | Admitting: Internal Medicine

## 2021-10-29 LAB — BASIC METABOLIC PANEL: Glucose: 283

## 2021-10-29 LAB — HEMOGLOBIN A1C: Hemoglobin A1C: 13.4

## 2021-10-29 LAB — MICROALBUMIN, URINE: Microalb, Ur: 6.5

## 2021-11-16 ENCOUNTER — Other Ambulatory Visit: Payer: Self-pay

## 2021-11-20 ENCOUNTER — Encounter: Payer: Self-pay | Admitting: Internal Medicine

## 2022-01-15 ENCOUNTER — Other Ambulatory Visit: Payer: Self-pay | Admitting: Cardiovascular Disease

## 2022-02-18 LAB — HEMOGLOBIN A1C: Hemoglobin A1C: 7.9

## 2022-02-24 LAB — HM DIABETES EYE EXAM

## 2022-04-14 ENCOUNTER — Encounter: Payer: Self-pay | Admitting: Internal Medicine

## 2022-10-18 DIAGNOSIS — E1165 Type 2 diabetes mellitus with hyperglycemia: Secondary | ICD-10-CM | POA: Insufficient documentation

## 2022-10-18 LAB — BASIC METABOLIC PANEL: Glucose: 108

## 2022-10-18 LAB — PROTEIN / CREATININE RATIO, URINE: Albumin, U: 6.5

## 2022-10-18 LAB — HEMOGLOBIN A1C: Hemoglobin A1C: 7

## 2022-10-18 LAB — MICROALBUMIN, URINE: Microalb, Ur: 7

## 2022-10-21 ENCOUNTER — Ambulatory Visit: Payer: 59 | Attending: Cardiovascular Disease | Admitting: Cardiovascular Disease

## 2022-10-21 ENCOUNTER — Encounter: Payer: Self-pay | Admitting: Cardiovascular Disease

## 2022-10-21 DIAGNOSIS — I2510 Atherosclerotic heart disease of native coronary artery without angina pectoris: Secondary | ICD-10-CM | POA: Diagnosis not present

## 2022-10-21 DIAGNOSIS — E782 Mixed hyperlipidemia: Secondary | ICD-10-CM | POA: Diagnosis not present

## 2022-10-21 DIAGNOSIS — I10 Essential (primary) hypertension: Secondary | ICD-10-CM

## 2022-10-21 DIAGNOSIS — E785 Hyperlipidemia, unspecified: Secondary | ICD-10-CM

## 2022-10-21 LAB — CBC

## 2022-10-21 MED ORDER — ATORVASTATIN CALCIUM 40 MG PO TABS: 40.0000 mg | ORAL_TABLET | Freq: Every day | ORAL | 3 refills | Status: DC

## 2022-10-21 MED ORDER — NITROGLYCERIN 0.4 MG SL SUBL: SUBLINGUAL_TABLET | SUBLINGUAL | 6 refills | Status: DC

## 2022-10-21 MED ORDER — METOPROLOL SUCCINATE ER 25 MG PO TB24: 25.0000 mg | ORAL_TABLET | Freq: Every day | ORAL | 3 refills | Status: DC

## 2022-10-21 NOTE — Patient Instructions (Signed)
Medication Instructions:  Your physician recommends that you continue on your current medications as directed. Please refer to the Current Medication list given to you today.  *If you need a refill on your cardiac medications before your next appointment, please call your pharmacy*   Lab Work: CBC, CMET, Lipids today If you have labs (blood work) drawn today and your tests are completely normal, you will receive your results only by: [place] (if you have MyChart) OR A paper copy in the mail If you have any lab test that is abnormal or we need to change your treatment, we will call you to review the results.   Testing/Procedures: NONE   Follow-Up: At [facility], you and your health needs are our priority.  As part of our continuing mission to provide you with exceptional heart care, we have created designated Provider Care Teams.  These Care Teams include your primary Cardiologist (physician) and Advanced Practice Providers (APPs -  Physician Assistants and Nurse Practitioners) who all work together to provide you with the care you need, when you need it.  We recommend signing up for the patient portal called "MyChart".  Sign up information is provided on this After Visit Summary.  MyChart is used to connect with patients for Virtual Visits (Telemedicine).  Patients are able to view lab/test results, encounter notes, upcoming appointments, etc.  Non-urgent messages can be sent to your provider as well.   To learn more about what you can do with MyChart, go to [email or web address].    Your next appointment:   1 year(s)  Provider:   Sherren Mocha, MD

## 2022-10-21 NOTE — Progress Notes (Signed)
Cardiology Office Note:    Date:  10/21/2022   ID:  William Mckenzie, DOB Oct 02, 1965, MRN [id number]  PCP:  Colon Branch, MD   [place] Providers Cardiologist:  Sherren Mocha, MD     Referring MD: Colon Branch, MD   Chief Complaint  Patient presents with   Coronary Artery Disease    History of Present Illness:    William Mckenzie is a 57 y.o. male with a hx of coronary artery disease, hypertension, mixed hyperlipidemia, and type 2 diabetes, presenting for follow-up evaluation.  The patient initially presented with an acute inferior wall MI in 2010, treated with primary PCI with a drug-eluting stent in the PDA branch of the RCA.  Last stress test in 2016 showed inferior scar without ischemia.  An echocardiogram at that time showed normal LV systolic function with an LVEF of 55 to 60%.  The patient is here alone today.  He has not been seen in our practice since 2020.  I last saw him personally in 2017.  He moved away to [place], [place] for a few years but is now back in the area.  He has run out of his atorvastatin and metoprolol succinate.  He has been off of these for several months.  The patient remains active and walks for exercise on a regular basis.  He denies chest pain, chest pressure, shortness of breath, heart palpitations, lightheadedness, or leg swelling.  He has continued to have regular follow-up with his endocrinologist in [place], [place].  He recently had a hemoglobin A1c and refills on his diabetes medications.  No other concerns voiced by the patient today.  Past Medical History:  Diagnosis Date   Acute meniscal tear, medial    Left knee   Anxiety    CAD (coronary artery disease)    prior inferior MI in 2010 treated with stenting of the right PDA; had nonobstructive disease in the LAD and LCX with approximately 40% lesions. EF normal   Diabetes mellitus without complication ([facility])    Hyperlipidemia    Hypertension     white coat    Myocardial infarction [facility])    Obesity     Past Surgical History:  Procedure Laterality Date   CARDIAC CATHETERIZATION     CORONARY ANGIOPLASTY     KNEE ARTHROSCOPY WITH MEDIAL MENISECTOMY Left 08/20/2019   Procedure: ARTHROSCOPY KNEE, partial medial menisectomy, chondroplasty;  Surgeon: Dorna Leitz, MD;  Location: [facility];  Service: Orthopedics;  Laterality: Left;   KNEE SURGERY Left 12/2010   LEFT, meniscus . arthroscopy   KNEE SURGERY Left 11/2014   Meniscus, medial    Current Medications: Current Meds  Medication Sig   ACCU-CHEK GUIDE test strip Use to test BGs once to twice daily   aspirin 81 MG tablet Take 1 tablet (81 mg total) by mouth daily.   BD PEN NEEDLE NANO 2ND GEN 32G X 4 MM MISC USE TO INJECT INSULIN DAILY   Cholecalciferol 50 MCG (2000 UT) TABS Take 1 tablet by mouth daily.   insulin degludec (TRESIBA FLEXTOUCH) 200 UNIT/ML FlexTouch Pen 42 Units daily. Per patient taking 42 units everyday   metFORMIN (GLUCOPHAGE-XR) 500 MG 24 hr tablet Take 2,000 mg by mouth daily.   ondansetron (ZOFRAN-ODT) 4 MG disintegrating tablet Take by mouth.   ONE TOUCH LANCETS MISC Check once daily.   OZEMPIC, 2 MG/DOSE, 8 MG/3ML SOPN Inject into the skin.   [DISCONTINUED] atorvastatin (LIPITOR) 40 MG tablet Take  1 tablet (40 mg total) by mouth daily. Patient must call and schedule an appointment for further refills 3rd/final attempt   [DISCONTINUED] buPROPion (WELLBUTRIN SR) 150 MG 12 hr tablet TAKE 1 TABLET BY MOUTH TWICE A DAY   [DISCONTINUED] metoprolol succinate (TOPROL-XL) 25 MG 24 hr tablet Take 1 tablet (25 mg total) by mouth daily. Please make overdue appt with Dr. Burt Knack before anymore refills. Thank you Final Attempt   [DISCONTINUED] nitroGLYCERIN (NITROSTAT) 0.4 MG SL tablet Place 1 tablet (0.4 mg total) under the tongue every 5 (five) minutes x 3 doses as needed for chest pain.   [DISCONTINUED] pioglitazone (ACTOS) 30 MG tablet Take 1 tablet  (30 mg total) by mouth daily.     Allergies:   Patient has no known allergies.   Social History   Socioeconomic History   Marital status: Married    Spouse name: Not on file   Number of children: 1   Years of education: Not on file   Highest education level: Not on file  Occupational History   Occupation: former [employer]   Occupation: CUSTOMER SERVICE [employer]    Employer: [employer]  Tobacco Use   Smoking status: Never   Smokeless tobacco: Former    Types: Chew    Quit date: 09/27/2012   Tobacco comments:    quit chewing tobacco 2010   Vaping Use   Vaping Use: Never used  Substance and Sexual Activity   Alcohol use: Yes    Comment: socially    Drug use: No   Sexual activity: Not on file  Other Topics Concern   Not on file  Social History Narrative   Lives w/ wife and daughter     Active martial arts      Social Determinants of Health   Financial Resource Strain: Not on file  Food Insecurity: Not on file  Transportation Needs: Not on file  Physical Activity: Not on file  Stress: Not on file  Social Connections: Not on file     Family History: The patient's family history includes CAD in an other family member; Heart disease in his father and paternal grandfather; Hypertension in his mother. There is no history of Colon cancer, Prostate cancer, Diabetes, Colon polyps, Esophageal cancer, Rectal cancer, or Stomach cancer.  ROS:   Please see the history of present illness.    All other systems reviewed and are negative.  EKGs/Labs/Other Studies Reviewed:    EKG:  EKG is ordered today.  The ekg ordered today demonstrates NSR 80 bpm, cannot rule out inferior infarct age-undetermined, left posterior fascicular block  Recent Labs: No results found for requested labs within last 365 days.  Recent Lipid Panel    Component Value Date/Time   CHOL 136 08/08/2020 1402   TRIG 128 08/08/2020 1402   HDL 53 08/08/2020 1402   CHOLHDL 2.6 08/08/2020 1402   VLDL 15.0 10/30/2019 1345    LDLCALC 62 08/08/2020 1402   LDLDIRECT 179.4 11/21/2007 1156     Risk Assessment/Calculations:                Physical Exam:    VS:  BP 110/82   Pulse 80   Ht '6\' 1"'$  (1.854 m)   Wt 247 lb 6.4 oz (112.2 kg)   SpO2 94%   BMI 32.64 kg/m     Wt Readings from Last 3 Encounters:  10/21/22 247 lb 6.4 oz (112.2 kg)  10/16/20 256 lb 8 oz (116.3 kg)  08/08/20 261 lb 6.4 oz (118.6  kg)     GEN:  Well nourished, well developed in no acute distress HEENT: Normal NECK: No JVD; No carotid bruits LYMPHATICS: No lymphadenopathy CARDIAC: RRR, no murmurs, rubs, gallops RESPIRATORY:  Clear to auscultation without rales, wheezing or rhonchi  ABDOMEN: Soft, non-tender, non-distended MUSCULOSKELETAL:  No edema; No deformity  SKIN: Warm and dry NEUROLOGIC:  Alert and oriented x 3 PSYCHIATRIC:  Normal affect   ASSESSMENT:    1. Atherosclerosis of native coronary artery of native heart without angina pectoris   2. Hyperlipidemia   3. Essential hypertension    PLAN:    In order of problems listed above:  The patient appears to be stable without symptoms of angina.  Will draw a CBC, metabolic panel, and lipid panel this morning.  He will continue on aspirin for antiplatelet therapy.  Will restart his atorvastatin and metoprolol succinate at previous doses.  Nitroglycerin refilled, but the patient has not needed to use this in many years. Goal LDL cholesterol less than 55 mg/dL.  Resume atorvastatin 40 mg daily.  Check lipids today for baseline. Blood pressure well-controlled.  Resume metoprolol succinate 25 mg daily.           Medication Adjustments/Labs and Tests Ordered: Current medicines are reviewed at length with the patient today.  Concerns regarding medicines are outlined above.  Orders Placed This Encounter  Procedures   CBC   Comprehensive metabolic panel   Lipid panel   EKG 12-Lead   Meds ordered this encounter  Medications   nitroGLYCERIN (NITROSTAT) 0.4 MG SL  tablet    Sig: Dissolve 1 tablet under the tongue every 5 minutes as needed for chest pain. Max of 3 doses, then 911.    Dispense:  25 tablet    Refill:  6   atorvastatin (LIPITOR) 40 MG tablet    Sig: Take 1 tablet (40 mg total) by mouth daily.    Dispense:  90 tablet    Refill:  3   metoprolol succinate (TOPROL-XL) 25 MG 24 hr tablet    Sig: Take 1 tablet (25 mg total) by mouth daily.    Dispense:  90 tablet    Refill:  3    Patient Instructions  Medication Instructions:  Your physician recommends that you continue on your current medications as directed. Please refer to the Current Medication list given to you today.  *If you need a refill on your cardiac medications before your next appointment, please call your pharmacy*   Lab Work: CBC, CMET, Lipids today If you have labs (blood work) drawn today and your tests are completely normal, you will receive your results only by: [place] (if you have MyChart) OR A paper copy in the mail If you have any lab test that is abnormal or we need to change your treatment, we will call you to review the results.   Testing/Procedures: NONE   Follow-Up: At [facility], you and your health needs are our priority.  As part of our continuing mission to provide you with exceptional heart care, we have created designated Provider Care Teams.  These Care Teams include your primary Cardiologist (physician) and Advanced Practice Providers (APPs -  Physician Assistants and Nurse Practitioners) who all work together to provide you with the care you need, when you need it.  We recommend signing up for the patient portal called "MyChart".  Sign up information is provided on this After Visit Summary.  MyChart is used to connect with patients for Virtual Visits (  Telemedicine).  Patients are able to view lab/test results, encounter notes, upcoming appointments, etc.  Non-urgent messages can be sent to your provider as well.   To learn more  about what you can do with MyChart, go to [email or web address].    Your next appointment:   1 year(s)  Provider:   Sherren Mocha, MD        Signed, Sherren Mocha, MD  10/21/2022 11:00 AM    [place]

## 2022-10-22 ENCOUNTER — Telehealth: Payer: Self-pay | Admitting: Cardiovascular Disease

## 2022-10-22 DIAGNOSIS — Z79899 Other long term (current) drug therapy: Secondary | ICD-10-CM

## 2022-10-22 LAB — COMPREHENSIVE METABOLIC PANEL
ALT: 39 IU/L (ref 0–44)
AST: 26 IU/L (ref 0–40)
Albumin/Globulin Ratio: 2.3 — ABNORMAL HIGH (ref 1.2–2.2)
Albumin: 4.9 g/dL (ref 3.8–4.9)
Alkaline Phosphatase: 65 IU/L (ref 44–121)
BUN/Creatinine Ratio: 12 (ref 9–20)
BUN: 13 mg/dL (ref 6–24)
Bilirubin Total: 0.5 mg/dL (ref 0.0–1.2)
CO2: 22 mmol/L (ref 20–29)
Calcium: 10.2 mg/dL (ref 8.7–10.2)
Chloride: 100 mmol/L (ref 96–106)
Creatinine, Ser: 1.1 mg/dL (ref 0.76–1.27)
Globulin, Total: 2.1 g/dL (ref 1.5–4.5)
Glucose: 82 mg/dL (ref 70–99)
Potassium: 4.4 mmol/L (ref 3.5–5.2)
Sodium: 140 mmol/L (ref 134–144)
Total Protein: 7 g/dL (ref 6.0–8.5)
eGFR: 79 mL/min/{1.73_m2} (ref >59)

## 2022-10-22 LAB — CBC
Hematocrit: 51 % (ref 37.5–51.0)
Hemoglobin: 17.2 g/dL (ref 13.0–17.7)
MCH: 31.5 pg (ref 26.6–33.0)
MCHC: 33.7 g/dL (ref 31.5–35.7)
MCV: 93 fL (ref 79–97)
Platelets: 259 10*3/uL (ref 150–450)
RBC: 5.46 x10E6/uL (ref 4.14–5.80)
RDW: 12.8 % (ref 11.6–15.4)
WBC: 7 10*3/uL (ref 3.4–10.8)

## 2022-10-22 LAB — LIPID PANEL
Chol/HDL Ratio: 4.3 ratio (ref 0.0–5.0)
Cholesterol, Total: 206 mg/dL — ABNORMAL HIGH (ref 100–199)
HDL: 48 mg/dL (ref >39)
LDL Chol Calc (NIH): 139 mg/dL — ABNORMAL HIGH (ref 0–99)
Triglycerides: 104 mg/dL (ref 0–149)
VLDL Cholesterol Cal: 19 mg/dL (ref 5–40)

## 2022-10-22 NOTE — Telephone Encounter (Signed)
Left detailed message on voicemail per DPR with results and need to repeat labs in 3 months. Advised pt that I would place labs for 01/21/23, but to call back if need to change date.

## 2022-10-22 NOTE — Telephone Encounter (Signed)
-----  Message from Sherren Mocha, MD sent at 10/22/2022  6:27 AM EST ----- Lipids above goal. Just started back on statin at yesterday's OV. Please repeat lipids/LFT's 3 months. thx

## 2022-11-03 ENCOUNTER — Encounter: Payer: Self-pay | Admitting: Internal Medicine

## 2022-11-04 ENCOUNTER — Encounter: Payer: Self-pay | Admitting: Internal Medicine

## 2023-01-10 ENCOUNTER — Encounter: Payer: Self-pay | Admitting: Internal Medicine

## 2023-01-21 ENCOUNTER — Ambulatory Visit: Payer: 59 | Attending: Cardiovascular Disease

## 2023-01-21 DIAGNOSIS — Z79899 Other long term (current) drug therapy: Secondary | ICD-10-CM

## 2023-01-22 LAB — LIPID PANEL
Chol/HDL Ratio: 2.1 ratio (ref 0.0–5.0)
Cholesterol, Total: 90 mg/dL — ABNORMAL LOW (ref 100–199)
HDL: 43 mg/dL (ref >39)
LDL Chol Calc (NIH): 30 mg/dL (ref 0–99)
Triglycerides: 83 mg/dL (ref 0–149)
VLDL Cholesterol Cal: 17 mg/dL (ref 5–40)

## 2023-01-22 LAB — HEPATIC FUNCTION PANEL
ALT: 41 IU/L (ref 0–44)
AST: 31 IU/L (ref 0–40)
Albumin: 4.5 g/dL (ref 3.8–4.9)
Alkaline Phosphatase: 66 IU/L (ref 44–121)
Bilirubin Total: 0.6 mg/dL (ref 0.0–1.2)
Bilirubin, Direct: 0.21 mg/dL (ref 0.00–0.40)
Total Protein: 6.5 g/dL (ref 6.0–8.5)

## 2023-10-22 ENCOUNTER — Other Ambulatory Visit: Payer: Self-pay | Admitting: Cardiovascular Disease

## 2023-10-22 DIAGNOSIS — I10 Essential (primary) hypertension: Secondary | ICD-10-CM

## 2023-10-22 DIAGNOSIS — E782 Mixed hyperlipidemia: Secondary | ICD-10-CM

## 2023-10-22 DIAGNOSIS — I2510 Atherosclerotic heart disease of native coronary artery without angina pectoris: Secondary | ICD-10-CM

## 2023-11-08 ENCOUNTER — Other Ambulatory Visit: Payer: Self-pay | Admitting: Cardiovascular Disease

## 2023-11-08 DIAGNOSIS — I10 Essential (primary) hypertension: Secondary | ICD-10-CM

## 2023-11-08 DIAGNOSIS — I2510 Atherosclerotic heart disease of native coronary artery without angina pectoris: Secondary | ICD-10-CM

## 2023-11-08 DIAGNOSIS — E782 Mixed hyperlipidemia: Secondary | ICD-10-CM

## 2024-01-05 ENCOUNTER — Other Ambulatory Visit: Payer: Self-pay | Admitting: Cardiovascular Disease

## 2024-01-05 DIAGNOSIS — E782 Mixed hyperlipidemia: Secondary | ICD-10-CM

## 2024-01-05 DIAGNOSIS — I2510 Atherosclerotic heart disease of native coronary artery without angina pectoris: Secondary | ICD-10-CM

## 2024-01-05 DIAGNOSIS — I10 Essential (primary) hypertension: Secondary | ICD-10-CM

## 2024-03-14 ENCOUNTER — Other Ambulatory Visit: Payer: Self-pay | Admitting: Family Medicine

## 2024-03-14 DIAGNOSIS — E782 Mixed hyperlipidemia: Secondary | ICD-10-CM

## 2024-03-14 DIAGNOSIS — I2510 Atherosclerotic heart disease of native coronary artery without angina pectoris: Secondary | ICD-10-CM

## 2024-03-14 DIAGNOSIS — I10 Essential (primary) hypertension: Secondary | ICD-10-CM

## 2024-03-14 NOTE — Telephone Encounter (Signed)
 Copied from CRM [id number]. Topic: Clinical - Medication Refill >> Mar 14, 2024  9:47 AM Earnestine Goes B wrote: Medication: metFORMIN  (GLUCOPHAGE -XR) 500 MG 24 hr tablet ,BD PEN NEEDLE NANO 2ND GEN 32G X 4 MM MISC, , atorvastatin  (LIPITOR) 40 MG tablet metoprolol  succinate (TOPROL -XL) 25 MG 24 hr tabletnitro GLYCERIN (NITROSTAT ) 0.4 MG SL tablet, OZEMPIC , 2 MG/DOSE, 8 MG/3ML SOPN  Has the patient contacted their pharmacy? Yes (Agent: If no, request that the patient contact the pharmacy for the refill. If patient does not wish to contact the pharmacy document the reason why and proceed with request.) (Agent: If yes, when and what did the pharmacy advise?)  This is the patient's preferred pharmacy:  [id number] [place], [place] - [address] [place] [address] Phone: [phone] Fax: [phone]    Is this the correct pharmacy for this prescription? yes If no, delete pharmacy and type the correct one.   Has the prescription been filled recently? Yes  Is the patient out of the medication? Yes  Has the patient been seen for an appointment in the last year OR does the patient have an upcoming appointment? Yes  Can we respond through MyChart? Yes  Agent: Please be advised that Rx refills may take up to 3 business days. We ask that you follow-up with your pharmacy.

## 2024-03-15 NOTE — Telephone Encounter (Signed)
 Patient is calling in regarding his medication refill request he would like to a call back regarding this

## 2024-03-15 NOTE — Telephone Encounter (Signed)
 Patient called back & I informed him that the request was refused due to not being established under [facility]'s care yet.

## 2024-03-19 ENCOUNTER — Telehealth: Payer: Self-pay | Admitting: Family Medicine

## 2024-03-19 NOTE — Telephone Encounter (Signed)
 Looks like pt had not seen Dr.Paz in three years and was scheduled as a new patient with taylor since Dr.Paz is not taking new patients currently. He is aware its a new patient appt but needs needles to administer his insulin.

## 2024-03-19 NOTE — Telephone Encounter (Signed)
 Pt is asking if we can call him in needles for his insulin to get him to his new pt appt date. Please call him to discuss.

## 2024-03-19 NOTE — Telephone Encounter (Signed)
 Called and discussed with patient. Made him aware that we can not prescribe anything until he is established here. He was seeing another doctor who prescribed his insulin, but he is no longer a patient there. I let him know that they would maybe send in his pen needles since they were the last to treat him, but he was not interested in contacting them. Will keep appt before anything can be prescribed.

## 2024-03-19 NOTE — Telephone Encounter (Signed)
 Looks like this will be a new patient for [facility] so can not send RX until established.   Patient used to see Dr. Amon, did he request change?

## 2024-03-23 ENCOUNTER — Emergency Department (HOSPITAL_COMMUNITY)
Admission: EM | Admit: 2024-03-23 | Discharge: 2024-03-23 | Discharge status: Against Medical Advice | Attending: Emergency Medicine | Admitting: Emergency Medicine

## 2024-03-23 ENCOUNTER — Encounter (HOSPITAL_COMMUNITY): Payer: Self-pay

## 2024-03-23 ENCOUNTER — Ambulatory Visit: Payer: Self-pay

## 2024-03-23 ENCOUNTER — Other Ambulatory Visit: Payer: Self-pay

## 2024-03-23 DIAGNOSIS — Z5321 Procedure and treatment not carried out due to patient leaving prior to being seen by health care provider: Secondary | ICD-10-CM | POA: Diagnosis not present

## 2024-03-23 DIAGNOSIS — R631 Polydipsia: Secondary | ICD-10-CM | POA: Insufficient documentation

## 2024-03-23 DIAGNOSIS — E119 Type 2 diabetes mellitus without complications: Secondary | ICD-10-CM | POA: Insufficient documentation

## 2024-03-23 DIAGNOSIS — R531 Weakness: Secondary | ICD-10-CM | POA: Insufficient documentation

## 2024-03-23 LAB — URINALYSIS, ROUTINE W REFLEX MICROSCOPIC
Bilirubin Urine: NEGATIVE
Glucose, UA: >=500 mg/dL — AB
Hgb urine dipstick: NEGATIVE
Ketones, ur: NEGATIVE mg/dL
Leukocytes,Ua: NEGATIVE
Nitrite: NEGATIVE
Protein, ur: NEGATIVE mg/dL
Specific Gravity, Urine: 1.033 — ABNORMAL HIGH (ref 1.005–1.030)
pH: 5 (ref 5.0–8.0)

## 2024-03-23 LAB — CBC WITH DIFFERENTIAL/PLATELET
Abs Immature Granulocytes: 0.05 10*3/uL (ref 0.00–0.07)
Basophils Absolute: 0 10*3/uL (ref 0.0–0.1)
Basophils Relative: 0 %
Eosinophils Absolute: 0.1 10*3/uL (ref 0.0–0.5)
Eosinophils Relative: 2 %
HCT: 45.6 % (ref 39.0–52.0)
Hemoglobin: 15.6 g/dL (ref 13.0–17.0)
Immature Granulocytes: 1 %
Lymphocytes Relative: 35 %
Lymphs Abs: 2.4 10*3/uL (ref 0.7–4.0)
MCH: 32.2 pg (ref 26.0–34.0)
MCHC: 34.2 g/dL (ref 30.0–36.0)
MCV: 94.2 fL (ref 80.0–100.0)
Monocytes Absolute: 0.5 10*3/uL (ref 0.1–1.0)
Monocytes Relative: 7 %
Neutro Abs: 3.8 10*3/uL (ref 1.7–7.7)
Neutrophils Relative %: 55 %
Platelets: 208 10*3/uL (ref 150–400)
RBC: 4.84 MIL/uL (ref 4.22–5.81)
RDW: 12 % (ref 11.5–15.5)
WBC: 6.8 10*3/uL (ref 4.0–10.5)
nRBC: 0 % (ref 0.0–0.2)

## 2024-03-23 LAB — HEMOGLOBIN A1C
Hgb A1c MFr Bld: 10.8 % — ABNORMAL HIGH (ref 4.8–5.6)
Mean Plasma Glucose: 263.26 mg/dL

## 2024-03-23 LAB — CBG MONITORING, ED: Glucose-Capillary: 335 mg/dL — ABNORMAL HIGH (ref 70–99)

## 2024-03-23 NOTE — Telephone Encounter (Unsigned)
 Copied from CRM [id number]. Topic: Clinical - Medication Question >> Mar 23, 2024  4:35 PM Chiquita SQUIBB wrote: Reason for CRM: Patient is calling to ask if he can have a refill for his metFORMIN  (GLUCOPHAGE -XR) 500 MG 24 hr tablet [[id number]] for the next 7 days atleast to get him to his appointment. Patient went to the ER today and had a high blood sugar and high A1c

## 2024-03-23 NOTE — ED Triage Notes (Signed)
 Pt has been off metformin  for 2 months but states he takes insulin. Pt checked BS and was 391 this morning. Pt states polyuria for the past week/ increased in thirst.  Denies CP/n/v

## 2024-03-23 NOTE — Telephone Encounter (Signed)
 FYI Only or Action Required?: FYI only for provider.  Patient was last seen in primary care on NA. Called Nurse Triage reporting Blood Sugar Problem. Symptoms began today. Interventions attempted: Nothing. Symptoms are: rapidly worsening.  Triage Disposition: Go to ED Now (Notify PCP)  Patient/caregiver understands and will follow disposition?: Yes     Copied from CRM [id number]. Topic: Clinical - Red Word Triage >> Mar 23, 2024 11:28 AM Mercedes MATSU wrote: Red Word that prompted transfer to Nurse Triage: Patient called in stating that his glucose is currently 391mg /dL, patient states that he is very thirsty, dry lips and using the restroom a lot. Reason for Disposition  [1] Vomiting AND [2] signs of dehydration (e.g., very dry mouth, lightheaded, dark urine)  Answer Assessment - Initial Assessment Questions 1. BLOOD GLUCOSE: What is your blood glucose level?      391 2. ONSET: When did you check the blood glucose?     This am; states out of metformin  for two months and only has insulin to take 3. USUAL RANGE: What is your glucose level usually? (e.g., usual fasting morning value, usual evening value)     Prior to losing insurance and being on metformin  was 80-120 4. KETONES: Do you check for ketones (urine or blood test strips)? If Yes, ask: What does the test show now?      no 5. TYPE 1 or 2:  Do you know what type of diabetes you have?  (e.g., Type 1, Type 2, Gestational; doesn't know)      2 6. INSULIN: Do you take insulin? What type of insulin(s) do you use? What is the mode of delivery? (syringe, pen; injection or pump)?      [place] 7. DIABETES PILLS: Do you take any pills for your diabetes? If Yes, ask: Have you missed taking any pills recently?     Metformin  but has been off of for two months 8. OTHER SYMPTOMS: Do you have any symptoms? (e.g., fever, frequent urination, difficulty breathing, dizziness, weakness, vomiting)     Frequent urination, thirsty,  tired 9. PREGNANCY: Is there any chance you are pregnant? When was your last menstrual period?     no  Protocols used: Diabetes - High Blood Sugar-A-AH

## 2024-03-23 NOTE — Telephone Encounter (Signed)
 Already spoken to patient about pen needles and instructed that we can not prescribe medications until he is an established patient.

## 2024-03-23 NOTE — ED Provider Triage Note (Signed)
 Emergency Medicine Provider Triage Evaluation Note  William Mckenzie , a 58 y.o. male  was evaluated in triage.  Pt complains of elevated CBG. Report hx of diabetes without his medications for several months while he lost his insurance.  Just recently got his insurance and was trying to f/u with PCP but was told to come to the ER because they do not have an appointment available today.  Does endorse mild weakness with increase thirst and urination.  No n/v/d  Review of Systems  Positive: As above Negative: As above  Physical Exam  BP (!) 168/91 (BP Location: Right Arm)   Pulse 79   Temp 98.3 F (36.8 C)   Resp (!) 22   Ht 6' (1.829 m)   Wt 99.8 kg   SpO2 98%   BMI 29.84 kg/m  Gen:   Awake, no distress   Resp:  Normal effort  MSK:   Moves extremities without difficulty  Other:    Medical Decision Making  Medically screening exam initiated at 1:15 PM.  Appropriate orders placed.  William Mckenzie was informed that the remainder of the evaluation will be completed by another provider, this initial triage assessment does not replace that evaluation, and the importance of remaining in the ED until their evaluation is complete.     Nivia Colon, PA-C 03/23/24 1317

## 2024-03-23 NOTE — ED Notes (Signed)
 Pt stated he was leaving and would follow up with PCP. Wristband cut off and pt labels shredded

## 2024-03-23 NOTE — Telephone Encounter (Signed)
 FYI. NP appt on 03/29/24, Pt going to ED.

## 2024-03-27 ENCOUNTER — Telehealth: Payer: Self-pay | Admitting: Cardiovascular Disease

## 2024-03-27 DIAGNOSIS — I2510 Atherosclerotic heart disease of native coronary artery without angina pectoris: Secondary | ICD-10-CM

## 2024-03-27 DIAGNOSIS — E782 Mixed hyperlipidemia: Secondary | ICD-10-CM

## 2024-03-27 DIAGNOSIS — I10 Essential (primary) hypertension: Secondary | ICD-10-CM

## 2024-03-27 MED ORDER — METOPROLOL SUCCINATE ER 25 MG PO TB24: 25.0000 mg | ORAL_TABLET | Freq: Every day | ORAL | 0 refills | Status: DC

## 2024-03-27 MED ORDER — ATORVASTATIN CALCIUM 40 MG PO TABS
40.0000 mg | ORAL_TABLET | Freq: Every day | ORAL | 0 refills | Status: DC
Start: 2024-03-27 — End: 2024-06-22

## 2024-03-27 NOTE — Telephone Encounter (Signed)
 Pt's medications were sent to pt's pharmacy as requested. Confirmation received.

## 2024-03-27 NOTE — Telephone Encounter (Signed)
*  STAT* If patient is at the pharmacy, call can be transferred to refill team.   1. Which medications need to be refilled? (please list name of each medication and dose if known)   metoprolol  succinate (TOPROL -XL) 25 MG 24 hr tablet   atorvastatin  (LIPITOR) 40 MG tablet   4. Which pharmacy/location (including street and city if local pharmacy) is medication to be sent to?  [id number] [address] [place], [place] - [address] Phone: [phone]  Fax: [phone]       5. Do they need a 30 day or 90 day supply?  90   Pt scheduled for 10/10 with Dr. Wonda

## 2024-03-29 ENCOUNTER — Ambulatory Visit: Payer: Self-pay | Admitting: Family Medicine

## 2024-03-29 ENCOUNTER — Ambulatory Visit: Admitting: Family Medicine

## 2024-03-29 VITALS — BP 113/67 | HR 77 | Ht 72.0 in | Wt 231.0 lb

## 2024-03-29 DIAGNOSIS — Z0000 Encounter for general adult medical examination without abnormal findings: Secondary | ICD-10-CM | POA: Diagnosis not present

## 2024-03-29 DIAGNOSIS — I2510 Atherosclerotic heart disease of native coronary artery without angina pectoris: Secondary | ICD-10-CM

## 2024-03-29 DIAGNOSIS — E1159 Type 2 diabetes mellitus with other circulatory complications: Secondary | ICD-10-CM

## 2024-03-29 DIAGNOSIS — Z9861 Coronary angioplasty status: Secondary | ICD-10-CM

## 2024-03-29 DIAGNOSIS — Z7984 Long term (current) use of oral hypoglycemic drugs: Secondary | ICD-10-CM

## 2024-03-29 DIAGNOSIS — E785 Hyperlipidemia, unspecified: Secondary | ICD-10-CM

## 2024-03-29 DIAGNOSIS — G4730 Sleep apnea, unspecified: Secondary | ICD-10-CM

## 2024-03-29 LAB — MICROALBUMIN / CREATININE URINE RATIO
Creatinine,U: 180.9 mg/dL
Microalb Creat Ratio: 6.1 mg/g (ref 0.0–30.0)
Microalb, Ur: 1.1 mg/dL (ref 0.0–1.9)

## 2024-03-29 LAB — COMPREHENSIVE METABOLIC PANEL WITH GFR
ALT: 38 U/L (ref 0–53)
AST: 25 U/L (ref 0–37)
Albumin: 4.5 g/dL (ref 3.5–5.2)
Alkaline Phosphatase: 59 U/L (ref 39–117)
BUN: 13 mg/dL (ref 6–23)
CO2: 31 meq/L (ref 19–32)
Calcium: 9.1 mg/dL (ref 8.4–10.5)
Chloride: 104 meq/L (ref 96–112)
Creatinine, Ser: 0.98 mg/dL (ref 0.40–1.50)
GFR: 85.22 mL/min (ref >60.00)
Glucose, Bld: 236 mg/dL — ABNORMAL HIGH (ref 70–99)
Potassium: 5 meq/L (ref 3.5–5.1)
Sodium: 140 meq/L (ref 135–145)
Total Bilirubin: 0.7 mg/dL (ref 0.2–1.2)
Total Protein: 6.6 g/dL (ref 6.0–8.3)

## 2024-03-29 LAB — LIPID PANEL
Cholesterol: 138 mg/dL (ref 0–200)
HDL: 44.7 mg/dL (ref >39.00)
LDL Cholesterol: 79 mg/dL (ref 0–99)
NonHDL: 92.93
Total CHOL/HDL Ratio: 3
Triglycerides: 69 mg/dL (ref 0.0–149.0)
VLDL: 13.8 mg/dL (ref 0.0–40.0)

## 2024-03-29 LAB — TSH: TSH: 0.76 u[IU]/mL (ref 0.35–5.50)

## 2024-03-29 MED ORDER — OZEMPIC (0.25 OR 0.5 MG/DOSE) 2 MG/3ML ~~LOC~~ SOPN: 0.2500 mg | PEN_INJECTOR | SUBCUTANEOUS | 1 refills | Status: DC

## 2024-03-29 MED ORDER — BD PEN NEEDLE NANO 2ND GEN 32G X 4 MM MISC: 1.0000 | Freq: Every day | 3 refills | Status: AC

## 2024-03-29 MED ORDER — METFORMIN HCL ER 500 MG PO TB24: 2000.0000 mg | ORAL_TABLET | Freq: Every day | ORAL | 1 refills | Status: DC

## 2024-03-29 NOTE — Assessment & Plan Note (Signed)
Medication management: Lipitor 40 mg daily Lifestyle factors for lowering cholesterol include: Diet therapy - heart-healthy diet rich in fruits, veggies, fiber-rich whole grains, lean meats, chicken, fish (at least twice a week), fat-free or 1% dairy products; foods low in saturated/trans fats, cholesterol, sodium, and sugar. Mediterranean diet has shown to be very heart healthy. Regular exercise - recommend at least 30 minutes a day, 5 times per week Weight management  Repeat CMP and lipid panel today

## 2024-03-29 NOTE — Patient Instructions (Signed)

## 2024-03-29 NOTE — Progress Notes (Signed)
 New Patient Office Visit  Subjective    Patient ID: William Mckenzie, male    DOB: Jan 29, 1966  Age: 58 y.o. MRN: [id number]  CC:  Chief Complaint  Patient presents with   Establish Care    HPI William Mckenzie presents to establish care.    Discussed the use of AI scribe software for clinical note transcription with the patient, who gave verbal consent to proceed.  History of Present Illness William Mckenzie is a 58 year old male with diabetes and coronary artery disease who presents with uncontrolled blood sugar levels.  He has experienced blood sugar levels as high as 500 mg/dL over the past week, which he monitors three times daily. Symptoms include frequent urination and insomnia, reminiscent of his initial diabetes diagnosis. He has been without his diabetes medications for over a month due to insurance issues, but continues to take Tresiba 42 units daily from his remaining supply. He has not been taking metformin  2000 mg daily or Ozempic  2 mg due to running out of these medications over a month ago. Dietary management with protein and vegetables has helped reduce his blood sugar to around 250 mg/dL. He went to the ED last week when he noticed his CBG >500, but reports he left without being seen after labs were drawn.   He has a history of myocardial infarction in 2010 with stent placement, high cholesterol, and glaucoma. He takes Lipitor 40 mg and metoprolol  25 mg daily. He reports improved sleep and cessation of snoring after losing 20-30 pounds, with no issues related to sleep apnea.  Family history includes heart disease on his father's side, with his father being the first male in the family to live past 19 years old. His mother has high blood pressure. He experienced a myocardial infarction at 68, attributing it to lifestyle and poor dietary choices.  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 He reports social alcohol  use, no drug use, and rare history of cigar smoking on vacations. He has no known allergies. He has experienced anxiety related to his high blood sugar levels over the past week but denies any history of depression.   CAD (PCI 2010), HLD: - Medications: aspirin  81 mg daily, atorvastatin  40 mg daily, metoprolol  succinate 25 mg daily,  - Compliance: good - Checking BP at home: no - Denies any SOB, recurrent headaches, CP, vision changes, LE edema, dizziness, palpitations, or medication side effects. - Diet: low carb - Exercise: walking - Follows with [facility] (Dr. Wonda) annually   Diabetes: - Checking glucose at home: yes, recently 250-500 - Medications: metformin  XR 2,000 mg daily, Tresiba 42 units daily, Ozempic  2 mg/week  - Compliance: ran out of metformin  and Ozempic  1-2 months ago; has only been taking [place] - Eye exam: pt to schedule - Foot exam: today - Microalbumin: today - Denies symptoms of hypoglycemia, polyuria, polydipsia, numbness extremities, foot ulcers/trauma, wounds that are not healing, medication side effects  Lab Results  Component Value Date   HGBA1C 10.8 (H) 03/23/2024     OSA: - management: lifestyle  - symptoms: none, resolved after weight loss         03/29/2024    9:40 AM 10/16/2020   10:50 AM 07/16/2020    3:48 PM  PHQ9 SCORE ONLY  PHQ-9 Total Score 6 1 0      03/29/2024    9:41 AM  GAD 7 : Generalized Anxiety Score  Nervous, Anxious,  on Edge 1  Control/stop worrying 0  Worry too much - different things 0  Trouble relaxing 0  Restless 0  Easily annoyed or irritable 0  Afraid - awful might happen 0  Total GAD 7 Score 1  Anxiety Difficulty Not difficult at all       Outpatient Encounter Medications as of 03/29/2024  Medication Sig   ACCU-CHEK GUIDE test strip Use to test BGs once to twice daily   aspirin  81 MG tablet Take 1 tablet (81 mg total) by mouth daily.   atorvastatin  (LIPITOR) 40 MG tablet Take 1 tablet (40 mg total)  by mouth daily.   metoprolol  succinate (TOPROL -XL) 25 MG 24 hr tablet Take 1 tablet (25 mg total) by mouth daily.   nitroGLYCERIN  (NITROSTAT ) 0.4 MG SL tablet Dissolve 1 tablet under the tongue every 5 minutes as needed for chest pain. Max of 3 doses, then 911.   ONE TOUCH LANCETS MISC Check once daily.   Semaglutide ,0.25 or 0.5MG /DOS, (OZEMPIC , 0.25 OR 0.5 MG/DOSE,) 2 MG/3ML SOPN Inject 0.25 mg into the skin once a week.   [DISCONTINUED] BD PEN NEEDLE NANO 2ND GEN 32G X 4 MM MISC USE TO INJECT INSULIN DAILY   BD PEN NEEDLE NANO 2ND GEN 32G X 4 MM MISC Inject 1 each into the skin daily.   insulin degludec (TRESIBA FLEXTOUCH) 200 UNIT/ML FlexTouch Pen 42 Units daily. Per patient taking 42 units everyday (Patient not taking: Reported on 03/29/2024)   metFORMIN  (GLUCOPHAGE -XR) 500 MG 24 hr tablet Take 4 tablets (2,000 mg total) by mouth daily.   [DISCONTINUED] Cholecalciferol 50 MCG (2000 UT) TABS Take 1 tablet by mouth daily.   [DISCONTINUED] metFORMIN  (GLUCOPHAGE -XR) 500 MG 24 hr tablet Take 2,000 mg by mouth daily. (Patient not taking: Reported on 03/29/2024)   [DISCONTINUED] ondansetron  (ZOFRAN -ODT) 4 MG disintegrating tablet Take by mouth.   [DISCONTINUED] OZEMPIC , 2 MG/DOSE, 8 MG/3ML SOPN Inject into the skin. (Patient not taking: Reported on 03/29/2024)   No facility-administered encounter medications on file as of 03/29/2024.    Past Medical History:  Diagnosis Date   Acute meniscal tear, medial    Left knee   Anxiety    CAD (coronary artery disease)    prior inferior MI in 2010 treated with stenting of the right PDA; had nonobstructive disease in the LAD and LCX with approximately 40% lesions. EF normal   Chondromalacia, left knee 08/20/2019   Complex tear of medial meniscus of left knee 08/20/2019   Diabetes mellitus without complication (HCC)    Glaucoma    Hyperlipidemia    Hypertension    white coat    Myocardial infarction [facility])    Obesity    Sleep apnea     Past Surgical  History:  Procedure Laterality Date   CARDIAC CATHETERIZATION     CORONARY ANGIOPLASTY     KNEE ARTHROSCOPY WITH MEDIAL MENISECTOMY Left 08/20/2019   Procedure: ARTHROSCOPY KNEE, partial medial menisectomy, chondroplasty;  Surgeon: Yvone Rush, MD;  Location: [facility];  Service: Orthopedics;  Laterality: Left;   KNEE SURGERY Left 12/2010   LEFT, meniscus . arthroscopy   KNEE SURGERY Left 11/2014   Meniscus, medial    Family History  Problem Relation Age of Onset   Hypertension Mother    Heart disease Father    CAD Other        PGGF   Heart disease Paternal Grandfather    Colon cancer Neg Hx    Prostate cancer Neg Hx  Diabetes Neg Hx    Colon polyps Neg Hx    Esophageal cancer Neg Hx    Rectal cancer Neg Hx    Stomach cancer Neg Hx     Social History   Socioeconomic History   Marital status: Married    Spouse name: Not on file   Number of children: 1   Years of education: Not on file   Highest education level: Bachelor's degree (e.g., BA, AB, BS)  Occupational History   Occupation: former [employer]   Occupation: CUSTOMER SERVICE M[employer]    Employer: [employer]  Tobacco Use   Smoking status: Never   Smokeless tobacco: Former    Types: Chew    Quit date: 09/27/2012   Tobacco comments:    quit chewing tobacco 2010   Vaping Use   Vaping status: Never Used  Substance and Sexual Activity   Alcohol use: Yes    Alcohol/week: 2.0 standard drinks of alcohol    Types: 2 Shots of liquor per week    Comment: socially    Drug use: No   Sexual activity: Not on file  Other Topics Concern   Not on file  Social History Narrative   Lives w/ wife and daughter     Active martial arts      Social Drivers of Health   Financial Resource Strain: Low Risk  (03/29/2024)   Overall Financial Resource Strain (CARDIA)    Difficulty of Paying Living Expenses: Not very hard  Food Insecurity: No Food Insecurity (03/29/2024)   Hunger Vital Sign    Worried About Running Out of Food in  the Last Year: Never true    Ran Out of Food in the Last Year: Never true  Transportation Needs: No Transportation Needs (03/29/2024)   PRAPARE - Administrator, Civil Service (Medical): No    Lack of Transportation (Non-Medical): No  Physical Activity: Sufficiently Active (03/29/2024)   Exercise Vital Sign    Days of Exercise per Week: 3 days    Minutes of Exercise per Session: 60 min  Stress: No Stress Concern Present (03/29/2024)   [employer] of Occupational Health - Occupational Stress Questionnaire    Feeling of Stress: Only a little  Social Connections: Moderately Isolated (03/29/2024)   Social Connection and Isolation Panel    Frequency of Communication with Friends and Family: More than three times a week    Frequency of Social Gatherings with Friends and Family: Twice a week    Attends Religious Services: More than 4 times per year    Active Member of [employer] or Organizations: No    Attends Engineer, structural: Not on file    Marital Status: Divorced  Intimate Partner Violence: Not on file    ROS All review of systems negative except what is listed in the HPI      Objective    BP 113/67   Pulse 77   Ht 6' (1.829 m)   Wt 231 lb (104.8 kg)   SpO2 96%   BMI 31.33 kg/m   Physical Exam Vitals reviewed.  Constitutional:      Appearance: Normal appearance.  Cardiovascular:     Rate and Rhythm: Normal rate and regular rhythm.     Heart sounds: Normal heart sounds.  Pulmonary:     Effort: Pulmonary effort is normal.     Breath sounds: Normal breath sounds.  Skin:    General: Skin is warm and dry.  Neurological:     Mental Status:  He is alert and oriented to person, place, and time.  Psychiatric:        Mood and Affect: Mood normal.        Behavior: Behavior normal.        Thought Content: Thought content normal.        Judgment: Judgment normal.       Diabetic foot exam was performed.  No deformities or other abnormal visual findings.   Posterior tibialis and dorsalis pulse intact bilaterally.  Intact to touch and monofilament testing bilaterally.           Assessment & Plan:   Problem List Items Addressed This Visit       Active Problems   Hyperlipidemia (Chronic)   Medication management: Lipitor 40 mg daily  Lifestyle factors for lowering cholesterol include: Diet therapy - heart-healthy diet rich in fruits, veggies, fiber-rich whole grains, lean meats, chicken, fish (at least twice a week), fat-free or 1% dairy products; foods low in saturated/trans fats, cholesterol, sodium, and sugar. Mediterranean diet has shown to be very heart healthy. Regular exercise - recommend at least 30 minutes a day, 5 times per week Weight management  Repeat CMP and lipid panel today       Relevant Orders   Comprehensive metabolic panel with GFR   TSH   Lipid panel   CAD S/P PCI 2010 (Chronic)   No new symptoms. Following annually with cardiology.  Blood pressure is at goal for age and co-morbidities.   Recommendations: continue current meds - BP goal <130/80 - monitor and log blood pressures at home - check around the same time each day in a relaxed setting - Limit salt to <2000 mg/day - Follow DASH eating plan (heart healthy diet) - limit alcohol to 2 standard drinks per day for men and 1 per day for women - avoid tobacco products - get at least 2 hours of regular aerobic exercise weekly Patient aware of signs/symptoms requiring further/urgent evaluation. Labs updated today.       Relevant Orders   TSH   Lipid panel   Sleep apnea (Chronic)   Symptoms resolved with weight loss.  No current CPAP use.      Type 2 diabetes mellitus with vascular disease (HCC) - Primary (Chronic)   Diabetes poorly controlled with blood glucose levels up to 500 mg/dL and Hemoglobin J8r at 89%. Medication adherence issues due to insurance and supply problems. Requires gradual titration of Ozempic  to avoid side effects since he  has been off of it for an extended period of time. - Restart Ozempic  at a lower dose and titrate up monthly. - Refill metformin  prescription. - Continue Tresiba - Order labs today - Schedule follow-up in three months to reassess A1c.      Relevant Medications   BD PEN NEEDLE NANO 2ND GEN 32G X 4 MM MISC   metFORMIN  (GLUCOPHAGE -XR) 500 MG 24 hr tablet   Semaglutide ,0.25 or 0.5MG /DOS, (OZEMPIC , 0.25 OR 0.5 MG/DOSE,) 2 MG/3ML SOPN   Other Relevant Orders   Microalbumin / creatinine urine ratio   Comprehensive metabolic panel with GFR   Other Visit Diagnoses       Encounter for medical examination to establish care              Return in about 3 months (around 06/29/2024) for chronic disease management.   Waddell KATHEE Mon, NP

## 2024-03-29 NOTE — Assessment & Plan Note (Signed)
 Diabetes poorly controlled with blood glucose levels up to 500 mg/dL and Hemoglobin J8r at 89%. Medication adherence issues due to insurance and supply problems. Requires gradual titration of Ozempic  to avoid side effects since he has been off of it for an extended period of time. - Restart Ozempic  at a lower dose and titrate up monthly. - Refill metformin  prescription. - Continue Tresiba - Order labs today - Schedule follow-up in three months to reassess A1c.

## 2024-03-29 NOTE — Assessment & Plan Note (Signed)
 Symptoms resolved with weight loss.  No current CPAP use.

## 2024-03-29 NOTE — Assessment & Plan Note (Signed)
 No new symptoms. Following annually with cardiology.  Blood pressure is at goal for age and co-morbidities.   Recommendations: continue current meds - BP goal <130/80 - monitor and log blood pressures at home - check around the same time each day in a relaxed setting - Limit salt to <2000 mg/day - Follow DASH eating plan (heart healthy diet) - limit alcohol to 2 standard drinks per day for men and 1 per day for women - avoid tobacco products - get at least 2 hours of regular aerobic exercise weekly Patient aware of signs/symptoms requiring further/urgent evaluation. Labs updated today.

## 2024-05-20 ENCOUNTER — Other Ambulatory Visit: Payer: Self-pay | Admitting: Family Medicine

## 2024-05-20 DIAGNOSIS — E1159 Type 2 diabetes mellitus with other circulatory complications: Secondary | ICD-10-CM

## 2024-05-21 NOTE — Telephone Encounter (Signed)
 From note in July:  Restart Ozempic  at a lower dose and titrate up monthly.   Sent for increased dosage. 0.5 mg - Waddell [facility]

## 2024-06-13 NOTE — Progress Notes (Unsigned)
 Cardiology Office Note:    Date:  06/15/2024   ID:  William Mckenzie, DOB [birth year], MRN [id number]  PCP:  Almarie Waddell NOVAK, NP   [facility] Providers Cardiologist:  Ozell Fell, MD     Referring MD: Almarie Waddell NOVAK, NP   Chief Complaint  Patient presents with   Coronary Artery Disease    History of Present Illness:    William Mckenzie is a 58 y.o. male with a hx of  coronary artery disease, hypertension, mixed hyperlipidemia, and type 2 diabetes, presenting for follow-up evaluation.   The patient initially presented with an acute inferior wall MI in 2010, treated with primary PCI with a drug-eluting stent in the PDA branch of the RCA.  Last stress test in 2020 showed normal perfusion and normal LV systolic function with gated LVEF 60%.  An echocardiogram in 2016 showed normal LV systolic function with an LVEF of 55 to 60% and no significant valvular disease.  The patient is here alone today. He recently retired. 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  When he had his labs drawn by his primary care doctor this summer, he had been off of medicines for about 6 weeks after running out and not being able to get them filled because he was living out of town.  He is back on his medicines and states his blood sugar is now well-controlled and he is doing fine.  He is exercising without any exertional symptoms.  He specifically denies chest pain, chest pressure, or shortness of breath.  No complaints today.   Current Medications: Current Meds  Medication Sig   ACCU-CHEK GUIDE test strip Use to test BGs once to twice daily   aspirin  81 MG tablet Take 1 tablet (81 mg total) by mouth daily.   atorvastatin  (LIPITOR) 40 MG tablet Take 1 tablet (40 mg total) by mouth daily.   BD PEN NEEDLE NANO 2ND GEN 32G X 4 MM MISC Inject 1 each into the skin daily.   insulin degludec (TRESIBA FLEXTOUCH) 200 UNIT/ML FlexTouch Pen 42 Units  daily. Per patient taking 42 units everyday   metFORMIN  (GLUCOPHAGE -XR) 500 MG 24 hr tablet Take 4 tablets (2,000 mg total) by mouth daily.   metoprolol  succinate (TOPROL -XL) 25 MG 24 hr tablet Take 1 tablet (25 mg total) by mouth daily.   ONE TOUCH LANCETS MISC Check once daily.   Semaglutide ,0.25 or 0.5MG /DOS, (OZEMPIC , 0.25 OR 0.5 MG/DOSE,) 2 MG/3ML SOPN Inject 0.5 mg into the skin once a week.   [DISCONTINUED] nitroGLYCERIN  (NITROSTAT ) 0.4 MG SL tablet Dissolve 1 tablet under the tongue every 5 minutes as needed for chest pain. Max of 3 doses, then 911.     Allergies:   Patient has no known allergies.   ROS:   Please see the history of present illness.    All other systems reviewed and are negative.  EKGs/Labs/Other Studies Reviewed:    The following studies were reviewed today: Cardiac Studies & Procedures   ______________________________________________________________________________________________   STRESS TESTS  MYOCARDIAL PERFUSION IMAGING 08/15/2019  Interpretation Summary  The left ventricular ejection fraction is normal (55-65%).  Nuclear stress EF: 60%.  There was no ST segment deviation noted during stress.  No T wave inversion was noted during stress.  The study is normal.  This is a Mckenzie risk study.  Mckenzie risk stress nuclear study with normal perfusion and normal left ventricular regional and global systolic function. Compared to 2016,  the previously described inferior defect is no longer seen and left ventricular systolic function has improved.            ______________________________________________________________________________________________      EKG:   EKG Interpretation Date/Time:  Friday June 15 2024 08:06:41 EDT Ventricular Rate:  70 PR Interval:  166 QRS Duration:  82 QT Interval:  396 QTC Calculation: 427 R Axis:   102  Text Interpretation: Normal sinus rhythm Rightward axis Inferior infarct , age undetermined When  compared with ECG of 05-Nov-2014 07:09, No significant change was found Confirmed by Wonda Sharper [phone]) on 06/15/2024 8:15:45 AM    Recent Labs: 03/23/2024: Hemoglobin 15.6; Platelets 208 03/29/2024: ALT 38; BUN 13; Creatinine, Ser 0.98; Potassium 5.0; Sodium 140; TSH 0.76  Recent Lipid Panel    Component Value Date/Time   CHOL 138 03/29/2024 0940   CHOL 90 (L) 01/21/2023 0809   TRIG 69.0 03/29/2024 0940   HDL 44.70 03/29/2024 0940   HDL 43 01/21/2023 0809   CHOLHDL 3 03/29/2024 0940   VLDL 13.8 03/29/2024 0940   LDLCALC 79 03/29/2024 0940   LDLCALC 30 01/21/2023 0809   LDLCALC 62 08/08/2020 1402   LDLDIRECT 179.4 11/21/2007 1156     Risk Assessment/Calculations:                Physical Exam:    VS:  BP 130/80   Pulse 70   Ht 6' (1.829 m)   Wt 236 lb (107 kg)   SpO2 97%   BMI 32.01 kg/m     Wt Readings from Last 3 Encounters:  06/15/24 236 lb (107 kg)  03/29/24 231 lb (104.8 kg)  03/23/24 220 lb (99.8 kg)     GEN:  Well nourished, well developed in no acute distress HEENT: Normal NECK: No JVD; No carotid bruits LYMPHATICS: No lymphadenopathy CARDIAC: RRR, no murmurs, rubs, gallops RESPIRATORY:  Clear to auscultation without rales, wheezing or rhonchi  ABDOMEN: Soft, non-tender, non-distended MUSCULOSKELETAL:  No edema; No deformity  SKIN: Warm and dry NEUROLOGIC:  Alert and oriented x 3 PSYCHIATRIC:  Normal affect   Assessment & Plan Atherosclerosis of native coronary artery of native heart without angina pectoris Doing well without symptoms of angina.  We talked about symptoms to keep an eye out for.  We discussed the importance of regular exercise and healthy diet.  He will continue on aspirin , atorvastatin , and metoprolol  succinate. Mixed hyperlipidemia Treated with atorvastatin  40 mg daily.  When his labs were checked he was off medication, and his cholesterol is 138, HDL 45, LDL 79.  I suspect he will be well at goal now on daily atorvastatin .   Continue the same. Essential hypertension Well-controlled on metoprolol  succinate.  Continue the same.  As part of his evaluation today, labs are reviewed from July 2025 demonstrating a hemoglobin A1c of 10.8 (off of medication), lipids as outlined above, and creatinine 0.98.  Patient has established with primary care.  I will see him back in 24 months.    Medication Adjustments/Labs and Tests Ordered: Current medicines are reviewed at length with the patient today.  Concerns regarding medicines are outlined above.  Orders Placed This Encounter  Procedures   EKG 12-Lead   Meds ordered this encounter  Medications   nitroGLYCERIN  (NITROSTAT ) 0.4 MG SL tablet    Sig: Dissolve 1 tablet under the tongue every 5 minutes as needed for chest pain. Max of 3 doses, then 911.    Dispense:  25 tablet    Refill:  3    Patient Instructions  Medication Instructions:  No medication changes were made at this visit. Continue current regimen.   *If you need a refill on your cardiac medications before your next appointment, please call your pharmacy*  Lab Work: None ordered today. If you have labs (blood work) drawn today and your tests are completely normal, you will receive your results only by: MyChart Message (if you have MyChart) OR A paper copy in the mail If you have any lab test that is abnormal or we need to change your treatment, we will call you to review the results.  Testing/Procedures: None ordered today.  Follow-Up: At [facility], you and your health needs are our priority.  As part of our continuing mission to provide you with exceptional heart care, our providers are all part of one team.  This team includes your primary Cardiologist (physician) and Advanced Practice Providers or APPs (Physician Assistants and Nurse Practitioners) who all work together to provide you with the care you need, when you need it.  Your next appointment:   2 year(s)  Provider:   Ozell Fell, MD      Signed, Ozell Fell, MD  06/15/2024 12:47 PM    [facility]

## 2024-06-15 ENCOUNTER — Encounter: Payer: Self-pay | Admitting: Cardiovascular Disease

## 2024-06-15 ENCOUNTER — Ambulatory Visit: Payer: Self-pay | Attending: Cardiovascular Disease | Admitting: Cardiovascular Disease

## 2024-06-15 VITALS — BP 130/80 | HR 70 | Ht 72.0 in | Wt 236.0 lb

## 2024-06-15 DIAGNOSIS — E782 Mixed hyperlipidemia: Secondary | ICD-10-CM

## 2024-06-15 DIAGNOSIS — I2510 Atherosclerotic heart disease of native coronary artery without angina pectoris: Secondary | ICD-10-CM

## 2024-06-15 DIAGNOSIS — I10 Essential (primary) hypertension: Secondary | ICD-10-CM

## 2024-06-15 MED ORDER — NITROGLYCERIN 0.4 MG SL SUBL: SUBLINGUAL_TABLET | SUBLINGUAL | 3 refills | Status: AC

## 2024-06-15 NOTE — Patient Instructions (Signed)
 Medication Instructions:  No medication changes were made at this visit. Continue current regimen.   *If you need a refill on your cardiac medications before your next appointment, please call your pharmacy*  Lab Work: None ordered today. If you have labs (blood work) drawn today and your tests are completely normal, you will receive your results only by: MyChart Message (if you have MyChart) OR A paper copy in the mail If you have any lab test that is abnormal or we need to change your treatment, we will call you to review the results.  Testing/Procedures: None ordered today.  Follow-Up: At [facility], you and your health needs are our priority.  As part of our continuing mission to provide you with exceptional heart care, our providers are all part of one team.  This team includes your primary Cardiologist (physician) and Advanced Practice Providers or APPs (Physician Assistants and Nurse Practitioners) who all work together to provide you with the care you need, when you need it.  Your next appointment:   2 year(s)  Provider:   Ozell Fell, MD

## 2024-06-15 NOTE — Assessment & Plan Note (Signed)
 Treated with atorvastatin  40 mg daily.  When his labs were checked he was off medication, and his cholesterol is 138, HDL 45, LDL 79.  I suspect he will be well at goal now on daily atorvastatin .  Continue the same.

## 2024-06-22 ENCOUNTER — Other Ambulatory Visit: Payer: Self-pay | Admitting: Cardiovascular Disease

## 2024-06-22 DIAGNOSIS — E782 Mixed hyperlipidemia: Secondary | ICD-10-CM

## 2024-06-22 DIAGNOSIS — I10 Essential (primary) hypertension: Secondary | ICD-10-CM

## 2024-06-22 DIAGNOSIS — I2510 Atherosclerotic heart disease of native coronary artery without angina pectoris: Secondary | ICD-10-CM

## 2024-06-29 ENCOUNTER — Encounter: Payer: Self-pay | Admitting: Family Medicine

## 2024-06-29 ENCOUNTER — Ambulatory Visit: Payer: Self-pay | Admitting: Family Medicine

## 2024-06-29 VITALS — BP 124/81 | HR 86 | Ht 72.0 in | Wt 237.0 lb

## 2024-06-29 DIAGNOSIS — E1165 Type 2 diabetes mellitus with hyperglycemia: Secondary | ICD-10-CM

## 2024-06-29 DIAGNOSIS — E1159 Type 2 diabetes mellitus with other circulatory complications: Secondary | ICD-10-CM

## 2024-06-29 DIAGNOSIS — Z23 Encounter for immunization: Secondary | ICD-10-CM

## 2024-06-29 DIAGNOSIS — Z9861 Coronary angioplasty status: Secondary | ICD-10-CM

## 2024-06-29 DIAGNOSIS — Z794 Long term (current) use of insulin: Secondary | ICD-10-CM

## 2024-06-29 DIAGNOSIS — E785 Hyperlipidemia, unspecified: Secondary | ICD-10-CM

## 2024-06-29 DIAGNOSIS — I2510 Atherosclerotic heart disease of native coronary artery without angina pectoris: Secondary | ICD-10-CM

## 2024-06-29 LAB — BASIC METABOLIC PANEL WITH GFR
BUN: 13 mg/dL (ref 6–23)
CO2: 28 meq/L (ref 19–32)
Calcium: 9.5 mg/dL (ref 8.4–10.5)
Chloride: 104 meq/L (ref 96–112)
Creatinine, Ser: 0.95 mg/dL (ref 0.40–1.50)
GFR: 88.3 mL/min (ref >60.00)
Glucose, Bld: 233 mg/dL — ABNORMAL HIGH (ref 70–99)
Potassium: 4.9 meq/L (ref 3.5–5.1)
Sodium: 141 meq/L (ref 135–145)

## 2024-06-29 LAB — HEMOGLOBIN A1C: Hgb A1c MFr Bld: 9.2 % — ABNORMAL HIGH (ref 4.6–6.5)

## 2024-06-29 MED ORDER — TRESIBA FLEXTOUCH 200 UNIT/ML ~~LOC~~ SOPN: 42.0000 [IU] | PEN_INJECTOR | Freq: Every day | SUBCUTANEOUS | 3 refills | Status: AC

## 2024-06-29 NOTE — Assessment & Plan Note (Signed)
 No new symptoms. Following annually with cardiology.  Blood pressure is at goal for age and co-morbidities.   Recommendations: continue current meds - BP goal <130/80 - monitor and log blood pressures at home - check around the same time each day in a relaxed setting - Limit salt to <2000 mg/day - Follow DASH eating plan (heart healthy diet) - limit alcohol to 2 standard drinks per day for men and 1 per day for women - avoid tobacco products - get at least 2 hours of regular aerobic exercise weekly Patient aware of signs/symptoms requiring further/urgent evaluation. Labs updated today.

## 2024-06-29 NOTE — Assessment & Plan Note (Signed)
 Medication management: Lipitor 40 mg daily Lifestyle factors for lowering cholesterol include: Diet therapy - heart-healthy diet rich in fruits, veggies, fiber-rich whole grains, lean meats, chicken, fish (at least twice a week), fat-free or 1% dairy products; foods low in saturated/trans fats, cholesterol, sodium, and sugar. Mediterranean diet has shown to be very heart healthy. Regular exercise - recommend at least 30 minutes a day, 5 times per week Weight management

## 2024-06-29 NOTE — Assessment & Plan Note (Signed)
 Blood glucose levels improved at home - Update A1c today. - Consider increasing Ozempic  dosage based on A1c results. - Follow up in 3 months to reassess A1c; if significantly improved, consider extending follow-up to 6 months.

## 2024-06-29 NOTE — Progress Notes (Signed)
 Establish Patient Office Visit  Subjective    Patient ID: William Mckenzie, male    DOB: 17-Jul-1966  Age: 58 y.o. MRN: [id number]  CC:  Chief Complaint  Patient presents with   Medical Management of Chronic Issues    HPI William Mckenzie presents for routine follow-up.  Reports he has been doing well. No acute concerns.    CAD (PCI 2010), HLD: - Medications: aspirin  81 mg daily, atorvastatin  40 mg daily, metoprolol  succinate 25 mg daily,  - Compliance: good - Checking BP at home: no - Denies any SOB, recurrent headaches, CP, vision changes, LE edema, dizziness, palpitations, or medication side effects. - Diet: low carb - Exercise: walking - Follows with [facility] (Dr. Wonda) annually   Diabetes: - Checking glucose at home: not recently - Medications: metformin  XR 2,000 mg daily, Tresiba 42 units daily, Ozempic  0.25 mg/week  - Compliance: good - Eye exam: pt to schedule - Foot exam: UTD - Microalbumin: UTD - Denies symptoms of hypoglycemia, polyuria, polydipsia, numbness extremities, foot ulcers/trauma, wounds that are not healing, medication side effects  Lab Results  Component Value Date   HGBA1C 10.8 (H) 03/23/2024     OSA: - management: lifestyle  - symptoms: none, resolved after weight loss         06/29/2024    8:11 AM 03/29/2024    9:40 AM 10/16/2020   10:50 AM  PHQ9 SCORE ONLY  PHQ-9 Total Score 0 6  1      Data saved with a previous flowsheet row definition      06/29/2024    8:11 AM 03/29/2024    9:41 AM  GAD 7 : Generalized Anxiety Score  Nervous, Anxious, on Edge 0 1  Control/stop worrying 0 0  Worry too much - different things 0 0  Trouble relaxing 0 0  Restless 0 0  Easily annoyed or irritable 0 0  Afraid - awful might happen 0 0  Total GAD 7 Score 0 1  Anxiety Difficulty Not difficult at all Not difficult at all       ROS All review of systems negative except what is listed in the HPI      Objective     BP 124/81   Pulse 86   Ht 6' (1.829 m)   Wt 237 lb (107.5 kg)   SpO2 96%   BMI 32.14 kg/m   Physical Exam Vitals reviewed.  Constitutional:      Appearance: Normal appearance.  Cardiovascular:     Rate and Rhythm: Normal rate and regular rhythm.     Heart sounds: Normal heart sounds.  Pulmonary:     Effort: Pulmonary effort is normal.     Breath sounds: Normal breath sounds.  Skin:    General: Skin is warm and dry.  Neurological:     Mental Status: He is alert and oriented to person, place, and time.  Psychiatric:        Mood and Affect: Mood normal.        Behavior: Behavior normal.        Thought Content: Thought content normal.        Judgment: Judgment normal.              Assessment & Plan:   Problem List Items Addressed This Visit       Active Problems   Hyperlipidemia (Chronic)   Medication management: Lipitor 40 mg daily  Lifestyle factors for lowering cholesterol include: Diet therapy - heart-healthy  diet rich in fruits, veggies, fiber-rich whole grains, lean meats, chicken, fish (at least twice a week), fat-free or 1% dairy products; foods low in saturated/trans fats, cholesterol, sodium, and sugar. Mediterranean diet has shown to be very heart healthy. Regular exercise - recommend at least 30 minutes a day, 5 times per week Weight management        CAD S/P PCI 2010 (Chronic)   No new symptoms. Following annually with cardiology.  Blood pressure is at goal for age and co-morbidities.   Recommendations: continue current meds - BP goal <130/80 - monitor and log blood pressures at home - check around the same time each day in a relaxed setting - Limit salt to <2000 mg/day - Follow DASH eating plan (heart healthy diet) - limit alcohol to 2 standard drinks per day for men and 1 per day for women - avoid tobacco products - get at least 2 hours of regular aerobic exercise weekly Patient aware of signs/symptoms requiring further/urgent  evaluation. Labs updated today.       Type 2 diabetes mellitus with vascular disease (HCC) - Primary (Chronic)   Blood glucose levels improved at home - Update A1c today. - Consider increasing Ozempic  dosage based on A1c results. - Follow up in 3 months to reassess A1c; if significantly improved, consider extending follow-up to 6 months.      Relevant Medications   insulin degludec (TRESIBA FLEXTOUCH) 200 UNIT/ML FlexTouch Pen   Other Relevant Orders   Hemoglobin A1c   Basic metabolic panel with GFR   Type 2 diabetes mellitus with hyperglycemia, with long-term current use of insulin (HCC)   Relevant Medications   insulin degludec (TRESIBA FLEXTOUCH) 200 UNIT/ML FlexTouch Pen   Other Visit Diagnoses       Immunization due       Relevant Orders   Flu vaccine trivalent PF, 6mos and older(Flulaval,Afluria,Fluarix,Fluzone) (Completed)   Pneumococcal conjugate vaccine 20-valent (Prevnar 20) (Completed)             Return in about 3 months (around 09/29/2024) for chronic disease management.   William Mckenzie Mon, NP

## 2024-07-02 ENCOUNTER — Ambulatory Visit: Payer: Self-pay | Admitting: Family Medicine

## 2024-07-02 DIAGNOSIS — E1159 Type 2 diabetes mellitus with other circulatory complications: Secondary | ICD-10-CM

## 2024-07-02 DIAGNOSIS — Z794 Long term (current) use of insulin: Secondary | ICD-10-CM

## 2024-07-02 MED ORDER — OZEMPIC (0.25 OR 0.5 MG/DOSE) 2 MG/3ML ~~LOC~~ SOPN
0.5000 mg | PEN_INJECTOR | SUBCUTANEOUS | 1 refills | Status: AC
Start: 2024-07-02 — End: ?

## 2024-07-06 ENCOUNTER — Ambulatory Visit: Admitting: Cardiovascular Disease

## 2024-09-28 ENCOUNTER — Other Ambulatory Visit: Payer: Self-pay | Admitting: Family Medicine

## 2024-09-28 DIAGNOSIS — E1159 Type 2 diabetes mellitus with other circulatory complications: Secondary | ICD-10-CM
